# Patient Record
Sex: Female | Born: 1950 | Race: White | Hispanic: No | Smoking: Current every day smoker
Health system: Southern US, Community
[De-identification: ages and names within clinical notes are randomized; demographics above are authoritative.]

## PROBLEM LIST (undated history)

## (undated) DIAGNOSIS — M545 Low back pain, unspecified: Secondary | ICD-10-CM

## (undated) DIAGNOSIS — K219 Gastro-esophageal reflux disease without esophagitis: Secondary | ICD-10-CM

## (undated) DIAGNOSIS — E876 Hypokalemia: Secondary | ICD-10-CM

## (undated) DIAGNOSIS — N189 Chronic kidney disease, unspecified: Secondary | ICD-10-CM

## (undated) DIAGNOSIS — E785 Hyperlipidemia, unspecified: Secondary | ICD-10-CM

## (undated) DIAGNOSIS — M549 Dorsalgia, unspecified: Secondary | ICD-10-CM

## (undated) DIAGNOSIS — F319 Bipolar disorder, unspecified: Secondary | ICD-10-CM

## (undated) DIAGNOSIS — I10 Essential (primary) hypertension: Secondary | ICD-10-CM

## (undated) DIAGNOSIS — F32A Depression, unspecified: Secondary | ICD-10-CM

## (undated) DIAGNOSIS — R7303 Prediabetes: Secondary | ICD-10-CM

## (undated) DIAGNOSIS — J449 Chronic obstructive pulmonary disease, unspecified: Secondary | ICD-10-CM

## (undated) DIAGNOSIS — K439 Ventral hernia without obstruction or gangrene: Secondary | ICD-10-CM

## (undated) DIAGNOSIS — F17200 Nicotine dependence, unspecified, uncomplicated: Secondary | ICD-10-CM

## (undated) DIAGNOSIS — K5790 Diverticulosis of intestine, part unspecified, without perforation or abscess without bleeding: Secondary | ICD-10-CM

## (undated) HISTORY — PX: ABDOMINAL HYSTERECTOMY: SHX81

## (undated) HISTORY — DX: Essential (primary) hypertension: I10

## (undated) HISTORY — DX: Nicotine dependence, unspecified, uncomplicated: F17.200

## (undated) HISTORY — PX: SKIN BIOPSY: SHX1

## (undated) HISTORY — PX: CHOLECYSTECTOMY: SHX55

## (undated) HISTORY — DX: Low back pain: M54.5

## (undated) HISTORY — DX: Low back pain, unspecified: M54.50

## (undated) HISTORY — DX: Diverticulosis of intestine, part unspecified, without perforation or abscess without bleeding: K57.90

## (undated) HISTORY — DX: Prediabetes: R73.03

## (undated) HISTORY — PX: UPPER GI ENDOSCOPY: SHX6162

## (undated) HISTORY — PX: HERNIA REPAIR: SHX51

## (undated) HISTORY — DX: Chronic kidney disease, unspecified: N18.9

## (undated) HISTORY — DX: Ventral hernia without obstruction or gangrene: K43.9

## (undated) HISTORY — DX: Hyperlipidemia, unspecified: E78.5

## (undated) HISTORY — PX: BREAST LUMPECTOMY: SHX2

## (undated) HISTORY — PX: BREAST SURGERY: SHX581

## (undated) HISTORY — DX: Hypokalemia: E87.6

## (undated) HISTORY — PX: COLONOSCOPY: SHX174

## (undated) HISTORY — DX: Chronic obstructive pulmonary disease, unspecified: J44.9

---

## 1986-12-18 HISTORY — PX: ABDOMINAL HYSTERECTOMY: SHX81

## 1998-05-18 ENCOUNTER — Other Ambulatory Visit: Admission: RE | Admit: 1998-05-18 | Discharge: 1998-05-18 | Payer: Self-pay

## 1998-05-31 ENCOUNTER — Emergency Department (HOSPITAL_COMMUNITY): Admission: EM | Admit: 1998-05-31 | Discharge: 1998-05-31 | Payer: Self-pay | Admitting: Emergency Medicine

## 1999-06-09 ENCOUNTER — Encounter: Payer: Self-pay | Admitting: Gastroenterology

## 1999-06-09 ENCOUNTER — Ambulatory Visit (HOSPITAL_COMMUNITY): Admission: RE | Admit: 1999-06-09 | Discharge: 1999-06-09 | Payer: Self-pay | Admitting: Gastroenterology

## 1999-06-13 ENCOUNTER — Ambulatory Visit (HOSPITAL_COMMUNITY): Admission: RE | Admit: 1999-06-13 | Discharge: 1999-06-13 | Payer: Self-pay | Admitting: Gastroenterology

## 1999-09-29 ENCOUNTER — Other Ambulatory Visit: Admission: RE | Admit: 1999-09-29 | Discharge: 1999-09-29 | Payer: Self-pay | Admitting: Family Medicine

## 1999-11-18 ENCOUNTER — Encounter: Payer: Self-pay | Admitting: General Surgery

## 1999-11-21 ENCOUNTER — Encounter: Payer: Self-pay | Admitting: General Surgery

## 1999-11-21 ENCOUNTER — Ambulatory Visit (HOSPITAL_COMMUNITY): Admission: RE | Admit: 1999-11-21 | Discharge: 1999-11-21 | Payer: Self-pay | Admitting: General Surgery

## 2001-04-01 ENCOUNTER — Encounter: Admission: RE | Admit: 2001-04-01 | Discharge: 2001-04-01 | Payer: Self-pay | Admitting: General Surgery

## 2001-04-01 ENCOUNTER — Encounter: Payer: Self-pay | Admitting: General Surgery

## 2002-01-23 ENCOUNTER — Encounter: Payer: Self-pay | Admitting: General Surgery

## 2002-01-23 ENCOUNTER — Encounter: Admission: RE | Admit: 2002-01-23 | Discharge: 2002-01-23 | Payer: Self-pay | Admitting: General Surgery

## 2002-06-05 ENCOUNTER — Emergency Department (HOSPITAL_COMMUNITY): Admission: EM | Admit: 2002-06-05 | Discharge: 2002-06-05 | Payer: Self-pay | Admitting: *Deleted

## 2002-06-06 ENCOUNTER — Ambulatory Visit (HOSPITAL_COMMUNITY): Admission: RE | Admit: 2002-06-06 | Discharge: 2002-06-06 | Payer: Self-pay | Admitting: *Deleted

## 2002-12-10 ENCOUNTER — Ambulatory Visit (HOSPITAL_COMMUNITY): Admission: RE | Admit: 2002-12-10 | Discharge: 2002-12-10 | Payer: Self-pay | Admitting: Gastroenterology

## 2002-12-10 ENCOUNTER — Encounter: Payer: Self-pay | Admitting: Gastroenterology

## 2003-01-23 ENCOUNTER — Encounter: Payer: Self-pay | Admitting: General Surgery

## 2003-01-23 ENCOUNTER — Encounter: Admission: RE | Admit: 2003-01-23 | Discharge: 2003-01-23 | Payer: Self-pay | Admitting: General Surgery

## 2004-06-16 ENCOUNTER — Encounter: Admission: RE | Admit: 2004-06-16 | Discharge: 2004-06-16 | Payer: Self-pay | Admitting: General Surgery

## 2004-12-18 HISTORY — PX: CHOLECYSTECTOMY: SHX55

## 2005-06-22 ENCOUNTER — Encounter: Admission: RE | Admit: 2005-06-22 | Discharge: 2005-06-22 | Payer: Self-pay | Admitting: Internal Medicine

## 2005-12-22 ENCOUNTER — Encounter: Admission: RE | Admit: 2005-12-22 | Discharge: 2005-12-22 | Payer: Self-pay | Admitting: Internal Medicine

## 2006-07-24 ENCOUNTER — Encounter: Admission: RE | Admit: 2006-07-24 | Discharge: 2006-07-24 | Payer: Self-pay | Admitting: Internal Medicine

## 2007-08-01 ENCOUNTER — Encounter: Admission: RE | Admit: 2007-08-01 | Discharge: 2007-08-01 | Payer: Self-pay | Admitting: Family Medicine

## 2008-09-23 ENCOUNTER — Encounter: Admission: RE | Admit: 2008-09-23 | Discharge: 2008-09-23 | Payer: Self-pay | Admitting: Family Medicine

## 2010-10-05 ENCOUNTER — Encounter: Admission: RE | Admit: 2010-10-05 | Discharge: 2010-10-05 | Payer: Self-pay | Admitting: Family Medicine

## 2010-10-21 ENCOUNTER — Encounter: Admission: RE | Admit: 2010-10-21 | Discharge: 2010-10-21 | Payer: Self-pay | Admitting: Family Medicine

## 2011-01-08 ENCOUNTER — Encounter: Payer: Self-pay | Admitting: Family Medicine

## 2011-09-28 ENCOUNTER — Other Ambulatory Visit: Payer: Self-pay | Admitting: Family Medicine

## 2011-09-28 DIAGNOSIS — Z1231 Encounter for screening mammogram for malignant neoplasm of breast: Secondary | ICD-10-CM

## 2011-10-24 ENCOUNTER — Ambulatory Visit
Admission: RE | Admit: 2011-10-24 | Discharge: 2011-10-24 | Disposition: A | Discharge status: Home / Self Care | Payer: Medicare Other | Source: Ambulatory Visit | Attending: Family Medicine | Admitting: Family Medicine

## 2011-10-24 DIAGNOSIS — Z1231 Encounter for screening mammogram for malignant neoplasm of breast: Secondary | ICD-10-CM

## 2012-09-25 ENCOUNTER — Other Ambulatory Visit: Payer: Self-pay | Admitting: Family Medicine

## 2012-09-25 DIAGNOSIS — Z1231 Encounter for screening mammogram for malignant neoplasm of breast: Secondary | ICD-10-CM

## 2012-10-24 ENCOUNTER — Ambulatory Visit
Admission: RE | Admit: 2012-10-24 | Discharge: 2012-10-24 | Disposition: A | Discharge status: Home / Self Care | Payer: Medicare Other | Source: Ambulatory Visit | Attending: Family Medicine | Admitting: Family Medicine

## 2012-10-24 DIAGNOSIS — Z1231 Encounter for screening mammogram for malignant neoplasm of breast: Secondary | ICD-10-CM

## 2014-01-15 ENCOUNTER — Other Ambulatory Visit: Payer: Self-pay | Admitting: Physician Assistant

## 2014-01-15 ENCOUNTER — Other Ambulatory Visit: Payer: Self-pay

## 2014-01-15 DIAGNOSIS — N644 Mastodynia: Secondary | ICD-10-CM

## 2014-01-22 ENCOUNTER — Ambulatory Visit
Admission: RE | Admit: 2014-01-22 | Discharge: 2014-01-22 | Disposition: A | Discharge status: Home / Self Care | Payer: Medicare Other | Source: Ambulatory Visit | Attending: Physician Assistant | Admitting: Physician Assistant

## 2014-01-22 DIAGNOSIS — N644 Mastodynia: Secondary | ICD-10-CM

## 2014-08-25 ENCOUNTER — Emergency Department (HOSPITAL_COMMUNITY)
Admission: EM | Admit: 2014-08-25 | Discharge: 2014-08-25 | Disposition: A | Discharge status: Home / Self Care | Payer: Medicare Other | Attending: Dermatology | Admitting: Dermatology

## 2014-08-25 ENCOUNTER — Encounter (HOSPITAL_COMMUNITY): Payer: Self-pay | Admitting: Emergency Medicine

## 2014-08-25 ENCOUNTER — Emergency Department (HOSPITAL_COMMUNITY): Payer: Medicare Other

## 2014-08-25 DIAGNOSIS — IMO0002 Reserved for concepts with insufficient information to code with codable children: Secondary | ICD-10-CM | POA: Insufficient documentation

## 2014-08-25 DIAGNOSIS — Y9389 Activity, other specified: Secondary | ICD-10-CM | POA: Insufficient documentation

## 2014-08-25 DIAGNOSIS — S0993XA Unspecified injury of face, initial encounter: Secondary | ICD-10-CM | POA: Insufficient documentation

## 2014-08-25 DIAGNOSIS — Y92410 Unspecified street and highway as the place of occurrence of the external cause: Secondary | ICD-10-CM | POA: Diagnosis not present

## 2014-08-25 DIAGNOSIS — M542 Cervicalgia: Secondary | ICD-10-CM

## 2014-08-25 DIAGNOSIS — F411 Generalized anxiety disorder: Secondary | ICD-10-CM | POA: Insufficient documentation

## 2014-08-25 DIAGNOSIS — S199XXA Unspecified injury of neck, initial encounter: Principal | ICD-10-CM

## 2014-08-25 HISTORY — DX: Bipolar disorder, unspecified: F31.9

## 2014-08-25 HISTORY — DX: Dorsalgia, unspecified: M54.9

## 2014-08-25 HISTORY — DX: Gastro-esophageal reflux disease without esophagitis: K21.9
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
## 2014-08-25 MED ORDER — OXYCODONE-ACETAMINOPHEN 5-325 MG PO TABS
2.0000 | ORAL_TABLET | Freq: Once | ORAL | Status: AC
  Administered 2014-08-25: 2 via ORAL
  Filled 2014-08-25: qty 2

## 2014-08-25 MED ORDER — OXYCODONE-ACETAMINOPHEN 5-325 MG PO TABS: 1.0000 | ORAL_TABLET | Freq: Four times a day (QID) | ORAL | Status: DC | PRN

## 2014-08-25 MED ORDER — ONDANSETRON 8 MG PO TBDP
8.0000 mg | ORAL_TABLET | Freq: Three times a day (TID) | ORAL | Status: DC | PRN
Start: 2014-08-25 — End: 2018-07-01

## 2014-08-25 MED ORDER — ONDANSETRON 4 MG PO TBDP
8.0000 mg | ORAL_TABLET | Freq: Once | ORAL | Status: AC
  Administered 2014-08-25: 8 mg via ORAL
  Filled 2014-08-25: qty 2

## 2014-08-25 NOTE — Discharge Instructions (Signed)
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
## 2014-08-25 NOTE — ED Notes (Signed)
Patient transported to X-ray 
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
## 2015-01-21 DIAGNOSIS — Z79899 Other long term (current) drug therapy: Secondary | ICD-10-CM | POA: Diagnosis not present

## 2015-01-21 DIAGNOSIS — R7309 Other abnormal glucose: Secondary | ICD-10-CM | POA: Diagnosis not present

## 2015-01-21 DIAGNOSIS — E782 Mixed hyperlipidemia: Secondary | ICD-10-CM | POA: Diagnosis not present

## 2015-01-26 DIAGNOSIS — R7309 Other abnormal glucose: Secondary | ICD-10-CM | POA: Diagnosis not present

## 2015-01-26 DIAGNOSIS — Z79899 Other long term (current) drug therapy: Secondary | ICD-10-CM | POA: Diagnosis not present

## 2015-01-26 DIAGNOSIS — N183 Chronic kidney disease, stage 3 (moderate): Secondary | ICD-10-CM | POA: Diagnosis not present

## 2015-01-26 DIAGNOSIS — K219 Gastro-esophageal reflux disease without esophagitis: Secondary | ICD-10-CM | POA: Diagnosis not present

## 2015-01-26 DIAGNOSIS — I10 Essential (primary) hypertension: Secondary | ICD-10-CM | POA: Diagnosis not present

## 2015-02-05 DIAGNOSIS — M545 Low back pain: Secondary | ICD-10-CM | POA: Diagnosis not present

## 2015-09-29 DIAGNOSIS — Z23 Encounter for immunization: Secondary | ICD-10-CM | POA: Diagnosis not present

## 2015-09-29 DIAGNOSIS — N183 Chronic kidney disease, stage 3 (moderate): Secondary | ICD-10-CM | POA: Diagnosis not present

## 2015-09-29 DIAGNOSIS — E876 Hypokalemia: Secondary | ICD-10-CM | POA: Diagnosis not present

## 2015-09-29 DIAGNOSIS — Z79899 Other long term (current) drug therapy: Secondary | ICD-10-CM | POA: Diagnosis not present

## 2015-09-29 DIAGNOSIS — I10 Essential (primary) hypertension: Secondary | ICD-10-CM | POA: Diagnosis not present

## 2015-09-29 DIAGNOSIS — R7303 Prediabetes: Secondary | ICD-10-CM | POA: Diagnosis not present

## 2016-02-18 DIAGNOSIS — L570 Actinic keratosis: Secondary | ICD-10-CM | POA: Diagnosis not present

## 2016-02-18 DIAGNOSIS — D2272 Melanocytic nevi of left lower limb, including hip: Secondary | ICD-10-CM | POA: Diagnosis not present

## 2016-02-18 DIAGNOSIS — Z85828 Personal history of other malignant neoplasm of skin: Secondary | ICD-10-CM | POA: Diagnosis not present

## 2016-02-18 DIAGNOSIS — L821 Other seborrheic keratosis: Secondary | ICD-10-CM | POA: Diagnosis not present

## 2016-02-18 DIAGNOSIS — D692 Other nonthrombocytopenic purpura: Secondary | ICD-10-CM | POA: Diagnosis not present

## 2016-02-18 DIAGNOSIS — D485 Neoplasm of uncertain behavior of skin: Secondary | ICD-10-CM | POA: Diagnosis not present

## 2016-04-28 ENCOUNTER — Other Ambulatory Visit: Payer: Self-pay

## 2016-04-28 DIAGNOSIS — Z1231 Encounter for screening mammogram for malignant neoplasm of breast: Secondary | ICD-10-CM

## 2016-05-22 ENCOUNTER — Ambulatory Visit: Payer: Self-pay

## 2016-05-26 ENCOUNTER — Ambulatory Visit
Admission: RE | Admit: 2016-05-26 | Discharge: 2016-05-26 | Disposition: A | Discharge status: Home / Self Care | Payer: Medicare Other | Source: Ambulatory Visit

## 2016-05-26 ENCOUNTER — Other Ambulatory Visit: Payer: Self-pay | Admitting: Family Medicine

## 2016-05-26 DIAGNOSIS — N644 Mastodynia: Secondary | ICD-10-CM

## 2016-05-26 DIAGNOSIS — Z1231 Encounter for screening mammogram for malignant neoplasm of breast: Secondary | ICD-10-CM

## 2016-05-31 ENCOUNTER — Ambulatory Visit
Admission: RE | Admit: 2016-05-31 | Discharge: 2016-05-31 | Disposition: A | Discharge status: Home / Self Care | Payer: Medicare Other | Source: Ambulatory Visit | Attending: Family Medicine | Admitting: Family Medicine

## 2016-05-31 ENCOUNTER — Other Ambulatory Visit: Payer: Self-pay

## 2016-05-31 DIAGNOSIS — N644 Mastodynia: Secondary | ICD-10-CM | POA: Diagnosis not present

## 2016-06-05 DIAGNOSIS — E876 Hypokalemia: Secondary | ICD-10-CM | POA: Diagnosis not present

## 2016-06-05 DIAGNOSIS — E782 Mixed hyperlipidemia: Secondary | ICD-10-CM | POA: Diagnosis not present

## 2016-06-05 DIAGNOSIS — I10 Essential (primary) hypertension: Secondary | ICD-10-CM | POA: Diagnosis not present

## 2016-06-05 DIAGNOSIS — N183 Chronic kidney disease, stage 3 (moderate): Secondary | ICD-10-CM | POA: Diagnosis not present

## 2016-06-05 DIAGNOSIS — G8929 Other chronic pain: Secondary | ICD-10-CM | POA: Diagnosis not present

## 2016-09-05 DIAGNOSIS — Z79899 Other long term (current) drug therapy: Secondary | ICD-10-CM | POA: Diagnosis not present

## 2016-09-05 DIAGNOSIS — N644 Mastodynia: Secondary | ICD-10-CM | POA: Diagnosis not present

## 2016-09-05 DIAGNOSIS — R300 Dysuria: Secondary | ICD-10-CM | POA: Diagnosis not present

## 2016-09-05 DIAGNOSIS — M549 Dorsalgia, unspecified: Secondary | ICD-10-CM | POA: Diagnosis not present

## 2016-09-05 DIAGNOSIS — Z23 Encounter for immunization: Secondary | ICD-10-CM | POA: Diagnosis not present

## 2016-10-13 DIAGNOSIS — R300 Dysuria: Secondary | ICD-10-CM | POA: Diagnosis not present

## 2016-10-16 DIAGNOSIS — R300 Dysuria: Secondary | ICD-10-CM | POA: Diagnosis not present

## 2018-07-01 ENCOUNTER — Encounter: Payer: Self-pay | Admitting: Internal Medicine

## 2018-07-01 ENCOUNTER — Ambulatory Visit: Payer: Medicare Other | Admitting: Internal Medicine

## 2018-07-01 ENCOUNTER — Encounter (INDEPENDENT_AMBULATORY_CARE_PROVIDER_SITE_OTHER): Payer: Self-pay

## 2018-07-01 VITALS — BP 120/80 | HR 76 | Ht 63.5 in | Wt 176.4 lb

## 2018-07-01 DIAGNOSIS — R159 Full incontinence of feces: Secondary | ICD-10-CM | POA: Diagnosis not present

## 2018-07-01 DIAGNOSIS — Z1211 Encounter for screening for malignant neoplasm of colon: Secondary | ICD-10-CM

## 2018-07-01 DIAGNOSIS — K6289 Other specified diseases of anus and rectum: Secondary | ICD-10-CM | POA: Diagnosis not present

## 2018-07-01 DIAGNOSIS — R32 Unspecified urinary incontinence: Secondary | ICD-10-CM
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
## 2018-07-01 NOTE — Patient Instructions (Signed)
  You have been scheduled for a colonoscopy. Please follow written instructions given to you at your visit today.  Please pick up your prep supplies at the pharmacy. If you use inhalers (even only as needed), please bring them with you on the day of your procedure.  You have been scheduled to have an anorectal manometry at [facility] on 07/17/18 at 10:30AM. Please arrive 30 minutes prior to your appointment time for registration (1st floor of the hospital-admissions).  Please make certain to use 1 Fleets enema 2 hours prior to coming for your appointment. You can purchase Fleets enemas from the laxative section at your drug store. You should not eat anything during the two hours prior to the procedure. You may take regular medications with small sips of water at least 2 hours prior to the study.  Anorectal manometry is a test performed to evaluate patients with constipation or fecal incontinence. This test measures the pressures of the anal sphincter muscles, the sensation in the rectum, and the neural reflexes that are needed for normal bowel movements.  THE PROCEDURE The test takes approximately 30 minutes to 1 hour. You will be asked to change into a hospital gown. A technician or nurse will explain the procedure to you, take a brief health history, and answer any questions you may have. The patient then lies on his or her left side. A small, flexible tube, about the size of a thermometer, with a balloon at the end is inserted into the rectum. The catheter is connected to a machine that measures the pressure. During the test, the small balloon attached to the catheter may be inflated in the rectum to assess the normal reflex pathways. The nurse or technician may also ask the person to squeeze, relax, and push at various times. The anal sphincter muscle pressures are measured during each of these maneuvers. To squeeze, the patient tightens the sphincter muscles as if trying to prevent anything  from coming out. To push or bear down, the patient strains down as if trying to have a bowel movement.   I appreciate the opportunity to care for you. Silvano Rusk, MD, [facility]

## 2018-07-07 ENCOUNTER — Encounter: Payer: Self-pay | Admitting: Internal Medicine

## 2018-07-07 DIAGNOSIS — K6289 Other specified diseases of anus and rectum: Secondary | ICD-10-CM | POA: Insufficient documentation

## 2018-07-07 DIAGNOSIS — R159 Full incontinence of feces: Secondary | ICD-10-CM | POA: Insufficient documentation

## 2018-07-17 ENCOUNTER — Ambulatory Visit (HOSPITAL_COMMUNITY)
Admission: RE | Admit: 2018-07-17 | Discharge: 2018-07-17 | Disposition: A | Discharge status: Home / Self Care | Payer: Medicare Other | Source: Ambulatory Visit | Attending: Gastroenterology | Admitting: Gastroenterology

## 2018-07-17 ENCOUNTER — Encounter (HOSPITAL_COMMUNITY): Payer: Self-pay | Admitting: Gastroenterology

## 2018-07-17 ENCOUNTER — Encounter (HOSPITAL_COMMUNITY)
Admission: RE | Disposition: A | Discharge status: Home / Self Care | Payer: Self-pay | Source: Ambulatory Visit | Attending: Gastroenterology

## 2018-07-17 DIAGNOSIS — R159 Full incontinence of feces: Secondary | ICD-10-CM | POA: Diagnosis not present

## 2018-07-17 DIAGNOSIS — K6289 Other specified diseases of anus and rectum: Secondary | ICD-10-CM | POA: Insufficient documentation

## 2018-07-17 HISTORY — PX: ANAL RECTAL MANOMETRY: SHX6358

## 2018-07-17 SURGERY — MANOMETRY, ANORECTAL

## 2018-07-17 NOTE — Progress Notes (Signed)
Anal Manometry done per protocol. Pt tolerated well without complication or distress.

## 2018-07-19 NOTE — Progress Notes (Signed)
Her anorectal function is abnormal - weak anal sphincter and discoordination of defection function  Please refer to pelvic PT   She can see me back next available

## 2018-08-15 ENCOUNTER — Encounter: Payer: Self-pay | Admitting: Internal Medicine

## 2018-08-27 ENCOUNTER — Ambulatory Visit (AMBULATORY_SURGERY_CENTER): Payer: Medicare Other | Admitting: Internal Medicine

## 2018-08-27 ENCOUNTER — Encounter: Payer: Self-pay | Admitting: Internal Medicine

## 2018-08-27 VITALS — BP 93/52 | HR 52 | Temp 95.3°F | Resp 18 | Ht 63.0 in | Wt 176.0 lb

## 2018-08-27 DIAGNOSIS — Z1211 Encounter for screening for malignant neoplasm of colon: Secondary | ICD-10-CM | POA: Diagnosis present

## 2018-08-27 MED ORDER — SODIUM CHLORIDE 0.9 % IV SOLN: 500.0000 mL | Freq: Once | INTRAVENOUS | Status: DC

## 2018-08-27 NOTE — Patient Instructions (Addendum)
No polyps or cancer seen.  You do have diverticulosis - thickened muscle rings and pouches in the colon wall. Please read the handout about this condition.  Next routine colonoscopy or other screening test in 10 years - 2029 - you will be 43 which is the start of current age group where we stop so maybe will not need one then. We will see.  PT co pay is $40. I am going to refer you for at least 1 visit to see what you think.  I appreciate the opportunity to care for you. Gatha Mayer, MD, FACG   YOU HAD AN ENDOSCOPIC PROCEDURE TODAY AT [facility]:   Refer to the procedure report that was given to you for any specific questions about what was found during the examination.  If the procedure report does not answer your questions, please call your gastroenterologist to clarify.  If you requested that your care partner not be given the details of your procedure findings, then the procedure report has been included in a sealed envelope for you to review at your convenience later.  YOU SHOULD EXPECT: Some feelings of bloating in the abdomen. Passage of more gas than usual.  Walking can help get rid of the air that was put into your GI tract during the procedure and reduce the bloating. If you had a lower endoscopy (such as a colonoscopy or flexible sigmoidoscopy) you may notice spotting of blood in your stool or on the toilet paper. If you underwent a bowel prep for your procedure, you may not have a normal bowel movement for a few days.  Please Note:  You might notice some irritation and congestion in your nose or some drainage.  This is from the oxygen used during your procedure.  There is no need for concern and it should clear up in a day or so.  SYMPTOMS TO REPORT IMMEDIATELY:   Following lower endoscopy (colonoscopy or flexible sigmoidoscopy):  Excessive amounts of blood in the stool  Significant tenderness or worsening of abdominal pains  Swelling of the abdomen  that is new, acute  Fever of 100F or higher   For urgent or emergent issues, a gastroenterologist can be reached at any hour by calling [phone].   DIET:  We do recommend a small meal at first, but then you may proceed to your regular diet.  Drink plenty of fluids but you should avoid alcoholic beverages for 24 hours.  ACTIVITY:  You should plan to take it easy for the rest of today and you should NOT DRIVE or use heavy machinery until tomorrow (because of the sedation medicines used during the test).    FOLLOW UP: Our staff will call the number listed on your records the next business day following your procedure to check on you and address any questions or concerns that you may have regarding the information given to you following your procedure. If we do not reach you, we will leave a message.  However, if you are feeling well and you are not experiencing any problems, there is no need to return our call.  We will assume that you have returned to your regular daily activities without incident.  If any biopsies were taken you will be contacted by phone or by letter within the next 1-3 weeks.  Please call us at [phone] if you have not heard about the biopsies in 3 weeks.    SIGNATURES/CONFIDENTIALITY: You and/or your care partner have signed  paperwork which will be entered into your electronic medical record.  These signatures attest to the fact that that the information above on your After Visit Summary has been reviewed and is understood.  Full responsibility of the confidentiality of this discharge information lies with you and/or your care-partner.   Continue present medications. Resume previous diet. Please read handout on Diverticulosis.

## 2018-08-27 NOTE — Op Note (Signed)
[place] Patient Name: Sandra Robinson Procedure Date: 08/27/2018 10:10 AM MRN: [id number] Endoscopist: Gatha Mayer , MD Age: 67 Referring MD:  Date of Birth: [birth year] Gender: Female Account #: [id number] Procedure:                Colonoscopy Indications:              Screening for colorectal malignant neoplasm, Last                            colonoscopy: 2008 Medicines:                Propofol per Anesthesia, Monitored Anesthesia Care Procedure:                Pre-Anesthesia Assessment:                           - Prior to the procedure, a History and Physical                            was performed, and patient medications and                            allergies were reviewed. The patient's tolerance of                            previous anesthesia was also reviewed. The risks                            and benefits of the procedure and the sedation                            options and risks were discussed with the patient.                            All questions were answered, and informed consent                            was obtained. Prior Anticoagulants: The patient has                            taken no previous anticoagulant or antiplatelet                            agents. ASA Grade Assessment: III - A patient with                            severe systemic disease. After reviewing the risks                            and benefits, the patient was deemed in                            satisfactory condition to undergo the procedure.  After obtaining informed consent, the colonoscope                            was passed under direct vision. Throughout the                            procedure, the patient's blood pressure, pulse, and                            oxygen saturations were monitored continuously. The                            Colonoscope was introduced through the anus and                            advanced to the  the cecum, identified by                            appendiceal orifice and ileocecal valve. The                            colonoscopy was somewhat difficult due to                            restricted mobility of the colon. Successful                            completion of the procedure was aided by                            straightening and shortening the scope to obtain                            bowel loop reduction. The patient tolerated the                            procedure well. The quality of the bowel                            preparation was good. The ileocecal valve,                            appendiceal orifice, and rectum were photographed.                            The bowel preparation used was Miralax. Scope In: 10:21:10 AM Scope Out: 10:36:19 AM Scope Withdrawal Time: 0 hours 8 minutes 18 seconds  Total Procedure Duration: 0 hours 15 minutes 9 seconds  Findings:                 The perianal and digital rectal examinations were                            normal.  Multiple diverticula were found in the sigmoid                            colon. There was narrowing of the colon in                            association with the diverticular opening.                           The exam was otherwise without abnormality on                            direct and retroflexion views. Complications:            No immediate complications. Estimated Blood Loss:     Estimated blood loss: none. Impression:               - Severe diverticulosis in the sigmoid colon. There                            was narrowing of the colon in association with the                            diverticular opening.                           - The examination was otherwise normal on direct                            and retroflexion views.                           - No specimens collected. Recommendation:           - Patient has a contact number available for                             emergencies. The signs and symptoms of potential                            delayed complications were discussed with the                            patient. Return to normal activities tomorrow.                            Written discharge instructions were provided to the                            patient.                           - Resume previous diet.                           - Continue present medications.                           -  Repeat colonoscopy in 10 years for screening                            purposes.                           - Will refer to pelvic PT to help with weak anal                            sphincter and incontinence Gatha Mayer, MD 08/27/2018 10:48:11 AM This report has been signed electronically.

## 2018-08-27 NOTE — Progress Notes (Signed)
Report given to PACU, vss 

## 2018-08-27 NOTE — Progress Notes (Signed)
Pt's states no medical or surgical changes since previsit or office visit. 

## 2018-08-28 ENCOUNTER — Telehealth: Payer: Self-pay | Admitting: Internal Medicine

## 2018-08-28 ENCOUNTER — Telehealth: Payer: Self-pay | Admitting: *Deleted

## 2018-08-28 ENCOUNTER — Telehealth: Payer: Self-pay

## 2018-08-28 DIAGNOSIS — K6289 Other specified diseases of anus and rectum: Secondary | ICD-10-CM

## 2018-08-28 DIAGNOSIS — R32 Unspecified urinary incontinence: Principal | ICD-10-CM

## 2018-08-28 DIAGNOSIS — R159 Full incontinence of feces: Secondary | ICD-10-CM

## 2018-08-28 NOTE — Telephone Encounter (Signed)
Patient states years ago it was recommended she have a colon resection for recurrent diverticulitis.  She wanted to know why Dr. Carlean Purl is not recommending.  Patient is advised that the recommendations were for PT.  She agrees to proceed.  She is advised that if she has recurrent diverticulitis she may be sent back to discuss with a surgeon.  She agrees to proceed with referral and will expect a call later this week.

## 2018-08-28 NOTE — Telephone Encounter (Signed)
  Follow up Call-  Call back number 08/27/2018  Post procedure Call Back phone  # [phone]  Permission to leave phone message Yes  Some recent data might be hidden    [facility]

## 2018-08-28 NOTE — Telephone Encounter (Signed)
Pt is returning your call and said she is doing good °

## 2018-08-28 NOTE — Telephone Encounter (Signed)
No answer, left message to call back later today, B.Briannia Laba RN. 

## 2018-09-17 DIAGNOSIS — Z01818 Encounter for other preprocedural examination: Secondary | ICD-10-CM

## 2020-11-01 DIAGNOSIS — Z1331 Encounter for screening for depression: Secondary | ICD-10-CM | POA: Diagnosis not present

## 2020-11-01 DIAGNOSIS — E669 Obesity, unspecified: Secondary | ICD-10-CM | POA: Diagnosis not present

## 2020-11-01 DIAGNOSIS — Z0000 Encounter for general adult medical examination without abnormal findings: Secondary | ICD-10-CM | POA: Diagnosis not present

## 2020-11-01 DIAGNOSIS — Z139 Encounter for screening, unspecified: Secondary | ICD-10-CM | POA: Diagnosis not present

## 2020-11-01 DIAGNOSIS — E785 Hyperlipidemia, unspecified: Secondary | ICD-10-CM | POA: Diagnosis not present

## 2020-11-01 DIAGNOSIS — Z9181 History of falling: Secondary | ICD-10-CM | POA: Diagnosis not present

## 2020-11-19 DIAGNOSIS — F3181 Bipolar II disorder: Secondary | ICD-10-CM | POA: Diagnosis not present

## 2021-02-24 DIAGNOSIS — F3181 Bipolar II disorder: Secondary | ICD-10-CM | POA: Diagnosis not present

## 2021-05-26 DIAGNOSIS — F3181 Bipolar II disorder: Secondary | ICD-10-CM | POA: Diagnosis not present

## 2021-06-06 DIAGNOSIS — L821 Other seborrheic keratosis: Secondary | ICD-10-CM | POA: Diagnosis not present

## 2021-06-06 DIAGNOSIS — D2272 Melanocytic nevi of left lower limb, including hip: Secondary | ICD-10-CM | POA: Diagnosis not present

## 2021-06-06 DIAGNOSIS — L570 Actinic keratosis: Secondary | ICD-10-CM | POA: Diagnosis not present

## 2021-06-06 DIAGNOSIS — L814 Other melanin hyperpigmentation: Secondary | ICD-10-CM | POA: Diagnosis not present

## 2021-06-06 DIAGNOSIS — L853 Xerosis cutis: Secondary | ICD-10-CM | POA: Diagnosis not present

## 2021-06-06 DIAGNOSIS — D1801 Hemangioma of skin and subcutaneous tissue: Secondary | ICD-10-CM | POA: Diagnosis not present

## 2021-06-06 DIAGNOSIS — L80 Vitiligo: Secondary | ICD-10-CM | POA: Diagnosis not present

## 2021-08-11 ENCOUNTER — Emergency Department (HOSPITAL_COMMUNITY): Payer: Medicare HMO

## 2021-08-11 ENCOUNTER — Other Ambulatory Visit: Payer: Self-pay

## 2021-08-11 ENCOUNTER — Encounter (HOSPITAL_COMMUNITY): Payer: Self-pay | Admitting: Internal Medicine

## 2021-08-11 ENCOUNTER — Inpatient Hospital Stay (HOSPITAL_COMMUNITY)
Admission: EM | Admit: 2021-08-11 | Discharge: 2021-08-18 | DRG: 041 | Disposition: A | Discharge status: Skilled Nursing Facility | Payer: Medicare HMO | Attending: Internal Medicine | Admitting: Internal Medicine

## 2021-08-11 DIAGNOSIS — F319 Bipolar disorder, unspecified: Secondary | ICD-10-CM | POA: Diagnosis present

## 2021-08-11 DIAGNOSIS — Z79891 Long term (current) use of opiate analgesic: Secondary | ICD-10-CM

## 2021-08-11 DIAGNOSIS — R000 Tachycardia, unspecified: Secondary | ICD-10-CM | POA: Diagnosis not present

## 2021-08-11 DIAGNOSIS — Z20822 Contact with and (suspected) exposure to covid-19: Secondary | ICD-10-CM | POA: Diagnosis present

## 2021-08-11 DIAGNOSIS — F419 Anxiety disorder, unspecified: Secondary | ICD-10-CM | POA: Diagnosis present

## 2021-08-11 DIAGNOSIS — R531 Weakness: Secondary | ICD-10-CM | POA: Diagnosis not present

## 2021-08-11 DIAGNOSIS — I6330 Cerebral infarction due to thrombosis of unspecified cerebral artery: Secondary | ICD-10-CM | POA: Insufficient documentation

## 2021-08-11 DIAGNOSIS — Z9049 Acquired absence of other specified parts of digestive tract: Secondary | ICD-10-CM

## 2021-08-11 DIAGNOSIS — J449 Chronic obstructive pulmonary disease, unspecified: Secondary | ICD-10-CM | POA: Diagnosis present

## 2021-08-11 DIAGNOSIS — R5383 Other fatigue: Secondary | ICD-10-CM | POA: Diagnosis not present

## 2021-08-11 DIAGNOSIS — R471 Dysarthria and anarthria: Secondary | ICD-10-CM | POA: Diagnosis present

## 2021-08-11 DIAGNOSIS — I69398 Other sequelae of cerebral infarction: Secondary | ICD-10-CM | POA: Diagnosis not present

## 2021-08-11 DIAGNOSIS — G929 Unspecified toxic encephalopathy: Secondary | ICD-10-CM | POA: Diagnosis present

## 2021-08-11 DIAGNOSIS — I10 Essential (primary) hypertension: Secondary | ICD-10-CM | POA: Diagnosis present

## 2021-08-11 DIAGNOSIS — G936 Cerebral edema: Secondary | ICD-10-CM | POA: Diagnosis not present

## 2021-08-11 DIAGNOSIS — I771 Stricture of artery: Secondary | ICD-10-CM | POA: Diagnosis not present

## 2021-08-11 DIAGNOSIS — I69811 Memory deficit following other cerebrovascular disease: Secondary | ICD-10-CM | POA: Diagnosis not present

## 2021-08-11 DIAGNOSIS — Z8673 Personal history of transient ischemic attack (TIA), and cerebral infarction without residual deficits: Secondary | ICD-10-CM | POA: Diagnosis not present

## 2021-08-11 DIAGNOSIS — Z90710 Acquired absence of both cervix and uterus: Secondary | ICD-10-CM

## 2021-08-11 DIAGNOSIS — F17210 Nicotine dependence, cigarettes, uncomplicated: Secondary | ICD-10-CM | POA: Diagnosis present

## 2021-08-11 DIAGNOSIS — R29704 NIHSS score 4: Secondary | ICD-10-CM | POA: Diagnosis present

## 2021-08-11 DIAGNOSIS — M6281 Muscle weakness (generalized): Secondary | ICD-10-CM | POA: Diagnosis not present

## 2021-08-11 DIAGNOSIS — R404 Transient alteration of awareness: Secondary | ICD-10-CM | POA: Diagnosis not present

## 2021-08-11 DIAGNOSIS — H5461 Unqualified visual loss, right eye, normal vision left eye: Secondary | ICD-10-CM | POA: Diagnosis present

## 2021-08-11 DIAGNOSIS — I6381 Other cerebral infarction due to occlusion or stenosis of small artery: Secondary | ICD-10-CM | POA: Diagnosis not present

## 2021-08-11 DIAGNOSIS — S199XXA Unspecified injury of neck, initial encounter: Secondary | ICD-10-CM | POA: Diagnosis not present

## 2021-08-11 DIAGNOSIS — Z8249 Family history of ischemic heart disease and other diseases of the circulatory system: Secondary | ICD-10-CM | POA: Diagnosis not present

## 2021-08-11 DIAGNOSIS — I739 Peripheral vascular disease, unspecified: Secondary | ICD-10-CM | POA: Diagnosis not present

## 2021-08-11 DIAGNOSIS — D320 Benign neoplasm of cerebral meninges: Secondary | ICD-10-CM | POA: Diagnosis present

## 2021-08-11 DIAGNOSIS — K219 Gastro-esophageal reflux disease without esophagitis: Secondary | ICD-10-CM | POA: Diagnosis present

## 2021-08-11 DIAGNOSIS — Z885 Allergy status to narcotic agent status: Secondary | ICD-10-CM | POA: Diagnosis not present

## 2021-08-11 DIAGNOSIS — Z79899 Other long term (current) drug therapy: Secondary | ICD-10-CM

## 2021-08-11 DIAGNOSIS — G9340 Encephalopathy, unspecified: Secondary | ICD-10-CM | POA: Diagnosis present

## 2021-08-11 DIAGNOSIS — I63432 Cerebral infarction due to embolism of left posterior cerebral artery: Secondary | ICD-10-CM | POA: Diagnosis present

## 2021-08-11 DIAGNOSIS — I517 Cardiomegaly: Secondary | ICD-10-CM | POA: Diagnosis not present

## 2021-08-11 DIAGNOSIS — E785 Hyperlipidemia, unspecified: Secondary | ICD-10-CM | POA: Diagnosis present

## 2021-08-11 DIAGNOSIS — F339 Major depressive disorder, recurrent, unspecified: Secondary | ICD-10-CM | POA: Diagnosis not present

## 2021-08-11 DIAGNOSIS — R410 Disorientation, unspecified: Secondary | ICD-10-CM | POA: Diagnosis not present

## 2021-08-11 DIAGNOSIS — I6503 Occlusion and stenosis of bilateral vertebral arteries: Secondary | ICD-10-CM | POA: Diagnosis not present

## 2021-08-11 DIAGNOSIS — Z041 Encounter for examination and observation following transport accident: Secondary | ICD-10-CM | POA: Diagnosis not present

## 2021-08-11 DIAGNOSIS — G319 Degenerative disease of nervous system, unspecified: Secondary | ICD-10-CM | POA: Diagnosis not present

## 2021-08-11 DIAGNOSIS — R414 Neurologic neglect syndrome: Secondary | ICD-10-CM | POA: Diagnosis present

## 2021-08-11 DIAGNOSIS — R4182 Altered mental status, unspecified: Secondary | ICD-10-CM | POA: Diagnosis not present

## 2021-08-11 DIAGNOSIS — R2681 Unsteadiness on feet: Secondary | ICD-10-CM | POA: Diagnosis not present

## 2021-08-11 DIAGNOSIS — Z139 Encounter for screening, unspecified: Secondary | ICD-10-CM

## 2021-08-11 DIAGNOSIS — R5381 Other malaise: Secondary | ICD-10-CM | POA: Diagnosis not present

## 2021-08-11 DIAGNOSIS — Z888 Allergy status to other drugs, medicaments and biological substances status: Secondary | ICD-10-CM

## 2021-08-11 DIAGNOSIS — G9389 Other specified disorders of brain: Secondary | ICD-10-CM | POA: Diagnosis not present

## 2021-08-11 DIAGNOSIS — S0990XA Unspecified injury of head, initial encounter: Secondary | ICD-10-CM | POA: Diagnosis not present

## 2021-08-11 DIAGNOSIS — I672 Cerebral atherosclerosis: Secondary | ICD-10-CM | POA: Diagnosis not present

## 2021-08-11 DIAGNOSIS — I6389 Other cerebral infarction: Secondary | ICD-10-CM | POA: Diagnosis not present

## 2021-08-11 DIAGNOSIS — E876 Hypokalemia: Secondary | ICD-10-CM | POA: Diagnosis present

## 2021-08-11 DIAGNOSIS — R0902 Hypoxemia: Secondary | ICD-10-CM | POA: Diagnosis not present

## 2021-08-11 DIAGNOSIS — Z91040 Latex allergy status: Secondary | ICD-10-CM | POA: Diagnosis not present

## 2021-08-11 DIAGNOSIS — I4891 Unspecified atrial fibrillation: Secondary | ICD-10-CM | POA: Diagnosis not present

## 2021-08-11 DIAGNOSIS — Z743 Need for continuous supervision: Secondary | ICD-10-CM | POA: Diagnosis not present

## 2021-08-11 HISTORY — DX: Chronic obstructive pulmonary disease, unspecified: J44.9

## 2021-08-11 HISTORY — DX: Essential (primary) hypertension: I10

## 2021-08-11 HISTORY — DX: Depression, unspecified: F32.A

## 2021-08-11 LAB — AMMONIA: Ammonia: 19 umol/L (ref 9–35)

## 2021-08-11 LAB — I-STAT CHEM 8, ED
BUN: 9 mg/dL (ref 8–23)
Calcium, Ion: 1.17 mmol/L (ref 1.15–1.40)
Chloride: 103 mmol/L (ref 98–111)
Creatinine, Ser: 1.1 mg/dL — ABNORMAL HIGH (ref 0.44–1.00)
Glucose, Bld: 113 mg/dL — ABNORMAL HIGH (ref 70–99)
HCT: 42 % (ref 36.0–46.0)
Hemoglobin: 14.3 g/dL (ref 12.0–15.0)
Potassium: 2.1 mmol/L — CL (ref 3.5–5.1)
Sodium: 141 mmol/L (ref 135–145)
TCO2: 25 mmol/L (ref 22–32)

## 2021-08-11 LAB — COMPREHENSIVE METABOLIC PANEL
ALT: 23 U/L (ref 0–44)
AST: 28 U/L (ref 15–41)
Albumin: 3.6 g/dL (ref 3.5–5.0)
Alkaline Phosphatase: 55 U/L (ref 38–126)
Anion gap: 8 (ref 5–15)
BUN: 10 mg/dL (ref 8–23)
CO2: 25 mmol/L (ref 22–32)
Calcium: 9.1 mg/dL (ref 8.9–10.3)
Chloride: 105 mmol/L (ref 98–111)
Creatinine, Ser: 1.15 mg/dL — ABNORMAL HIGH (ref 0.44–1.00)
GFR, Estimated: 52 mL/min — ABNORMAL LOW (ref >60)
Glucose, Bld: 119 mg/dL — ABNORMAL HIGH (ref 70–99)
Potassium: 2.2 mmol/L — CL (ref 3.5–5.1)
Sodium: 138 mmol/L (ref 135–145)
Total Bilirubin: 0.7 mg/dL (ref 0.3–1.2)
Total Protein: 6.8 g/dL (ref 6.5–8.1)

## 2021-08-11 LAB — CBC WITH DIFFERENTIAL/PLATELET
Abs Immature Granulocytes: 0.06 10*3/uL (ref 0.00–0.07)
Basophils Absolute: 0.1 10*3/uL (ref 0.0–0.1)
Basophils Relative: 1 %
Eosinophils Absolute: 0.1 10*3/uL (ref 0.0–0.5)
Eosinophils Relative: 1 %
HCT: 43.1 % (ref 36.0–46.0)
Hemoglobin: 14.5 g/dL (ref 12.0–15.0)
Immature Granulocytes: 1 %
Lymphocytes Relative: 31 %
Lymphs Abs: 3 10*3/uL (ref 0.7–4.0)
MCH: 29.9 pg (ref 26.0–34.0)
MCHC: 33.6 g/dL (ref 30.0–36.0)
MCV: 88.9 fL (ref 80.0–100.0)
Monocytes Absolute: 0.7 10*3/uL (ref 0.1–1.0)
Monocytes Relative: 7 %
Neutro Abs: 5.7 10*3/uL (ref 1.7–7.7)
Neutrophils Relative %: 59 %
Platelets: 219 10*3/uL (ref 150–400)
RBC: 4.85 MIL/uL (ref 3.87–5.11)
RDW: 15.3 % (ref 11.5–15.5)
WBC: 9.7 10*3/uL (ref 4.0–10.5)
nRBC: 0 % (ref 0.0–0.2)

## 2021-08-11 LAB — RESP PANEL BY RT-PCR (FLU A&B, COVID) ARPGX2
Influenza A by PCR: NEGATIVE
Influenza B by PCR: NEGATIVE
SARS Coronavirus 2 by RT PCR: NEGATIVE

## 2021-08-11 LAB — PROTIME-INR
INR: 1 (ref 0.8–1.2)
Prothrombin Time: 13.4 seconds (ref 11.4–15.2)

## 2021-08-11 LAB — ETHANOL: Alcohol, Ethyl (B): <10 mg/dL (ref <10)
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
## 2021-08-11 MED ORDER — POTASSIUM CHLORIDE 10 MEQ/100ML IV SOLN
10.0000 meq | INTRAVENOUS | Status: AC
  Administered 2021-08-12 (×4): 10 meq via INTRAVENOUS
  Filled 2021-08-11 (×4): qty 100

## 2021-08-11 MED ORDER — POTASSIUM CHLORIDE 2 MEQ/ML IV SOLN
INTRAVENOUS | Status: AC
  Filled 2021-08-11 (×2): qty 1000

## 2021-08-11 MED ORDER — POTASSIUM CHLORIDE CRYS ER 20 MEQ PO TBCR
40.0000 meq | EXTENDED_RELEASE_TABLET | Freq: Once | ORAL | Status: AC
  Administered 2021-08-12: 40 meq via ORAL
  Filled 2021-08-11: qty 2

## 2021-08-11 MED ORDER — POTASSIUM CHLORIDE 10 MEQ/100ML IV SOLN
10.0000 meq | Freq: Once | INTRAVENOUS | Status: AC
  Administered 2021-08-11: 10 meq via INTRAVENOUS
  Filled 2021-08-11: qty 100

## 2021-08-11 MED ORDER — POTASSIUM CHLORIDE CRYS ER 20 MEQ PO TBCR
40.0000 meq | EXTENDED_RELEASE_TABLET | Freq: Once | ORAL | Status: AC
  Administered 2021-08-11: 40 meq via ORAL
  Filled 2021-08-11: qty 2

## 2021-08-11 MED ORDER — LABETALOL HCL 5 MG/ML IV SOLN
10.0000 mg | INTRAVENOUS | Status: DC | PRN
  Administered 2021-08-12 (×3): 10 mg via INTRAVENOUS
  Filled 2021-08-11 (×4): qty 4

## 2021-08-11 NOTE — ED Notes (Signed)
Patient returns from CT

## 2021-08-11 NOTE — ED Notes (Signed)
Patient transported to CT 
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
## 2021-08-11 NOTE — ED Notes (Signed)
Patient has a skin tear to the left elbow

## 2021-08-12 DIAGNOSIS — I10 Essential (primary) hypertension: Secondary | ICD-10-CM | POA: Diagnosis present

## 2021-08-12 DIAGNOSIS — Z20822 Contact with and (suspected) exposure to covid-19: Secondary | ICD-10-CM | POA: Diagnosis present

## 2021-08-12 DIAGNOSIS — F319 Bipolar disorder, unspecified: Secondary | ICD-10-CM | POA: Diagnosis present

## 2021-08-12 DIAGNOSIS — G9340 Encephalopathy, unspecified: Secondary | ICD-10-CM | POA: Diagnosis present

## 2021-08-12 DIAGNOSIS — I63432 Cerebral infarction due to embolism of left posterior cerebral artery: Secondary | ICD-10-CM | POA: Diagnosis present

## 2021-08-12 DIAGNOSIS — K219 Gastro-esophageal reflux disease without esophagitis: Secondary | ICD-10-CM | POA: Diagnosis present

## 2021-08-12 DIAGNOSIS — D320 Benign neoplasm of cerebral meninges: Secondary | ICD-10-CM | POA: Diagnosis present

## 2021-08-12 DIAGNOSIS — R29704 NIHSS score 4: Secondary | ICD-10-CM | POA: Diagnosis present

## 2021-08-12 DIAGNOSIS — Z8249 Family history of ischemic heart disease and other diseases of the circulatory system: Secondary | ICD-10-CM | POA: Diagnosis not present

## 2021-08-12 DIAGNOSIS — F419 Anxiety disorder, unspecified: Secondary | ICD-10-CM | POA: Diagnosis present

## 2021-08-12 DIAGNOSIS — R471 Dysarthria and anarthria: Secondary | ICD-10-CM | POA: Diagnosis present

## 2021-08-12 DIAGNOSIS — I6330 Cerebral infarction due to thrombosis of unspecified cerebral artery: Secondary | ICD-10-CM | POA: Diagnosis not present

## 2021-08-12 DIAGNOSIS — Z91040 Latex allergy status: Secondary | ICD-10-CM | POA: Diagnosis not present

## 2021-08-12 DIAGNOSIS — R414 Neurologic neglect syndrome: Secondary | ICD-10-CM | POA: Diagnosis present

## 2021-08-12 DIAGNOSIS — F17210 Nicotine dependence, cigarettes, uncomplicated: Secondary | ICD-10-CM | POA: Diagnosis present

## 2021-08-12 DIAGNOSIS — E785 Hyperlipidemia, unspecified: Secondary | ICD-10-CM | POA: Diagnosis present

## 2021-08-12 DIAGNOSIS — Z79891 Long term (current) use of opiate analgesic: Secondary | ICD-10-CM | POA: Diagnosis not present

## 2021-08-12 DIAGNOSIS — Z888 Allergy status to other drugs, medicaments and biological substances status: Secondary | ICD-10-CM | POA: Diagnosis not present

## 2021-08-12 DIAGNOSIS — Z9049 Acquired absence of other specified parts of digestive tract: Secondary | ICD-10-CM | POA: Diagnosis not present

## 2021-08-12 DIAGNOSIS — E876 Hypokalemia: Secondary | ICD-10-CM | POA: Diagnosis not present

## 2021-08-12 DIAGNOSIS — J449 Chronic obstructive pulmonary disease, unspecified: Secondary | ICD-10-CM | POA: Diagnosis present

## 2021-08-12 DIAGNOSIS — Z885 Allergy status to narcotic agent status: Secondary | ICD-10-CM | POA: Diagnosis not present

## 2021-08-12 DIAGNOSIS — H5461 Unqualified visual loss, right eye, normal vision left eye: Secondary | ICD-10-CM | POA: Diagnosis present

## 2021-08-12 DIAGNOSIS — Z8673 Personal history of transient ischemic attack (TIA), and cerebral infarction without residual deficits: Secondary | ICD-10-CM | POA: Diagnosis not present

## 2021-08-12 DIAGNOSIS — I6389 Other cerebral infarction: Secondary | ICD-10-CM | POA: Diagnosis not present

## 2021-08-12 DIAGNOSIS — Z90710 Acquired absence of both cervix and uterus: Secondary | ICD-10-CM | POA: Diagnosis not present

## 2021-08-12 LAB — BASIC METABOLIC PANEL
Anion gap: 5 (ref 5–15)
Anion gap: 9 (ref 5–15)
BUN: 7 mg/dL — ABNORMAL LOW (ref 8–23)
BUN: 8 mg/dL (ref 8–23)
CO2: 24 mmol/L (ref 22–32)
CO2: 21 mmol/L — ABNORMAL LOW (ref 22–32)
Calcium: 8.8 mg/dL — ABNORMAL LOW (ref 8.9–10.3)
Calcium: 9 mg/dL (ref 8.9–10.3)
Chloride: 110 mmol/L (ref 98–111)
Chloride: 109 mmol/L (ref 98–111)
Creatinine, Ser: 1.01 mg/dL — ABNORMAL HIGH (ref 0.44–1.00)
Creatinine, Ser: 1.02 mg/dL — ABNORMAL HIGH (ref 0.44–1.00)
GFR, Estimated: >60 mL/min (ref >60)
GFR, Estimated: 60 mL/min — ABNORMAL LOW (ref >60)
Glucose, Bld: 116 mg/dL — ABNORMAL HIGH (ref 70–99)
Glucose, Bld: 113 mg/dL — ABNORMAL HIGH (ref 70–99)
Potassium: 2.9 mmol/L — ABNORMAL LOW (ref 3.5–5.1)
Potassium: 3.2 mmol/L — ABNORMAL LOW (ref 3.5–5.1)
Sodium: 139 mmol/L (ref 135–145)
Sodium: 139 mmol/L (ref 135–145)

## 2021-08-12 LAB — TSH: TSH: 6.573 u[IU]/mL — ABNORMAL HIGH (ref 0.350–4.500)

## 2021-08-12 LAB — VITAMIN B12: Vitamin B-12: 167 pg/mL — ABNORMAL LOW (ref 180–914)

## 2021-08-12 LAB — RPR: RPR Ser Ql: NONREACTIVE

## 2021-08-12 LAB — T4, FREE: Free T4: 0.89 ng/dL (ref 0.61–1.12)

## 2021-08-12 LAB — MAGNESIUM: Magnesium: 1.8 mg/dL (ref 1.7–2.4)

## 2021-08-12 MED ORDER — MAGNESIUM SULFATE 2 GM/50ML IV SOLN
2.0000 g | Freq: Once | INTRAVENOUS | Status: AC
  Administered 2021-08-12: 2 g via INTRAVENOUS
  Filled 2021-08-12: qty 50

## 2021-08-12 MED ORDER — VITAMIN B-12 1000 MCG PO TABS
1000.0000 ug | ORAL_TABLET | Freq: Every day | ORAL | Status: DC
  Administered 2021-08-12 – 2021-08-17 (×6): 1000 ug via ORAL
  Filled 2021-08-12 (×6): qty 1

## 2021-08-12 MED ORDER — POTASSIUM CHLORIDE CRYS ER 20 MEQ PO TBCR
40.0000 meq | EXTENDED_RELEASE_TABLET | Freq: Two times a day (BID) | ORAL | Status: AC
  Administered 2021-08-12 – 2021-08-13 (×2): 40 meq via ORAL
  Filled 2021-08-12 (×2): qty 2

## 2021-08-12 NOTE — ED Notes (Signed)
Patient ambulated slowly with 2 assist to BR and back to bed. She stated she had urinated but no urine was noted. No stool noted. She remains oriented to self and stated she was at [facility]. Still disoriented to time and situation.

## 2021-08-12 NOTE — ED Notes (Signed)
Patient removed IV again. New IV inserted

## 2021-08-12 NOTE — Progress Notes (Signed)
Patient SBP was high with SBP of 192, gave PRN labetalol, recheck at 1842, SBP was 200, MD notified. Patient was restless, and when asked if they were in pain responded "no" when asked if they felt anxious at all, they responded "yes" when asked about why they were anxious and if this was something new for them dt hospitalization, they responded that they "had been having anxiety for a few weeks now" when asked if anything had triggered this anxiety patient stated they " could not pinpoint it" previous progress notes state patient had removed several IV's and at bedside this RN witnessed patient pulling off telemetry leads several times and fidgeting in bed. MD aware, no new orders at this time.

## 2021-08-12 NOTE — ED Notes (Signed)
Patient is again attempting to remove IV, patient is very redirectable. Patient remains in the RN's line of site. Patient given hot tea per request. Patient remains confused.

## 2021-08-12 NOTE — Plan of Care (Signed)

## 2021-08-12 NOTE — ED Notes (Signed)
This RN found patient on bed holding IV. Patient had removed own IV. Patient states "No it had to come out" This RN attempted to explain to patient why IV was important. Patient allowed this RN to reinsert new IV.

## 2021-08-12 NOTE — Plan of Care (Signed)
  Problem: Clinical Measurements: Goal: Will remain free from infection Outcome: Progressing Goal: Respiratory complications will improve Outcome: Progressing Goal: Cardiovascular complication will be avoided Outcome: Progressing   Problem: Activity: Goal: Risk for activity intolerance will decrease Outcome: Progressing   Problem: Nutrition: Goal: Adequate nutrition will be maintained Outcome: Progressing   Problem: Elimination: Goal: Will not experience complications related to bowel motility Outcome: Progressing Goal: Will not experience complications related to urinary retention Outcome: Progressing   Problem: Safety: Goal: Ability to remain free from injury will improve Outcome: Progressing   Problem: Skin Integrity: Goal: Risk for impaired skin integrity will decrease Outcome: Progressing

## 2021-08-12 NOTE — ED Notes (Signed)
Provider at bedside

## 2021-08-12 NOTE — ED Notes (Signed)
A/A, oriented to self. Eating breakfast.
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
## 2021-08-12 NOTE — ED Notes (Signed)
Tele  Breakfast Ordered 

## 2021-08-12 NOTE — ED Notes (Addendum)
Resting, eyes closed, respirations even and unlabored. No distress. IV fluid infusing without difficulty.

## 2021-08-13 ENCOUNTER — Inpatient Hospital Stay (HOSPITAL_COMMUNITY): Payer: Medicare HMO

## 2021-08-13 LAB — BASIC METABOLIC PANEL
Anion gap: 9 (ref 5–15)
BUN: <5 mg/dL — ABNORMAL LOW (ref 8–23)
CO2: 22 mmol/L (ref 22–32)
Calcium: 9.2 mg/dL (ref 8.9–10.3)
Chloride: 107 mmol/L (ref 98–111)
Creatinine, Ser: 0.85 mg/dL (ref 0.44–1.00)
GFR, Estimated: >60 mL/min (ref >60)
Glucose, Bld: 121 mg/dL — ABNORMAL HIGH (ref 70–99)
Potassium: 3 mmol/L — ABNORMAL LOW (ref 3.5–5.1)
Sodium: 138 mmol/L (ref 135–145)

## 2021-08-13 LAB — CBC
HCT: 41.2 % (ref 36.0–46.0)
Hemoglobin: 14.3 g/dL (ref 12.0–15.0)
MCH: 30 pg (ref 26.0–34.0)
MCHC: 34.7 g/dL (ref 30.0–36.0)
MCV: 86.4 fL (ref 80.0–100.0)
Platelets: 238 10*3/uL (ref 150–400)
RBC: 4.77 MIL/uL (ref 3.87–5.11)
RDW: 15 % (ref 11.5–15.5)
WBC: 11 10*3/uL — ABNORMAL HIGH (ref 4.0–10.5)
nRBC: 0 % (ref 0.0–0.2)

## 2021-08-13 LAB — MAGNESIUM: Magnesium: 1.7 mg/dL (ref 1.7–2.4)

## 2021-08-13 IMAGING — MR MR HEAD W/O CM
6 of 10 series · 26 of 48 positions shown · non-contrast
Comparison: Head CT [DATE]

CLINICAL DATA: Mental status change, unknown cause.

EXAM:
MRI HEAD WITHOUT CONTRAST
TECHNIQUE: Multiplanar, multiecho pulse sequences of the brain and surrounding
structures were obtained without intravenous contrast.

[Series 2: DWI · axial · 3.0mm · 0.94mm/px · z∈[-52,+94]mm · 8 of 100 slices shown (1 of 2)]
[im 1/100]
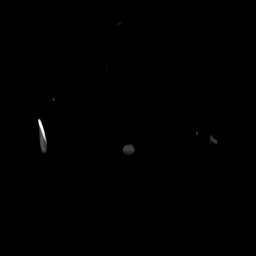
[im 15/100]
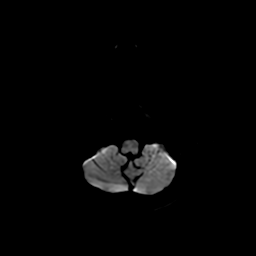
[im 29/100]
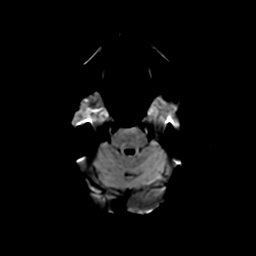
[im 43/100]
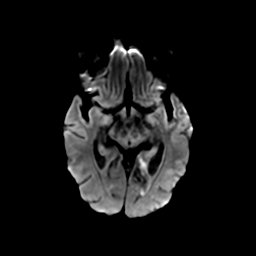
[im 57/100]
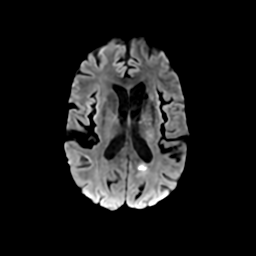
[im 71/100]
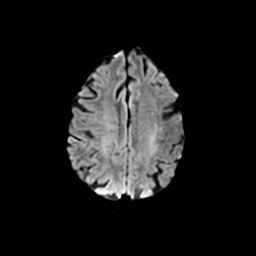
[im 85/100]
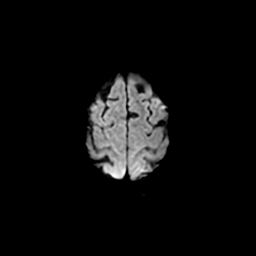
[im 100/100]
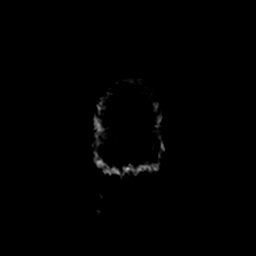

[Series 3: DWI · coronal · 4.0mm · 0.94mm/px · 6 of 73 slices shown (2 of 2)]
[im 1/73]
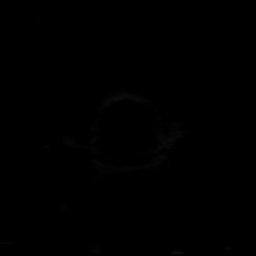
[im 15/73]
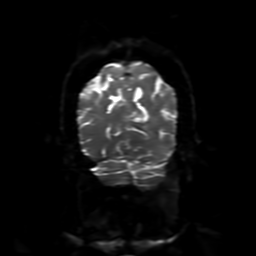
[im 29/73]
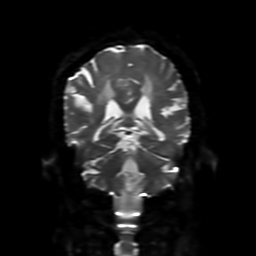
[im 44/73]
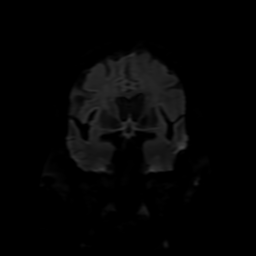
[im 58/73]
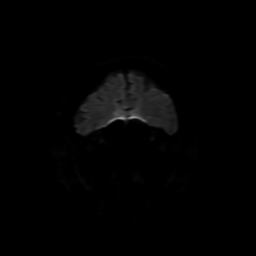
[im 73/73]
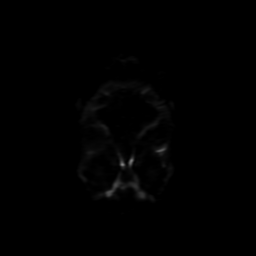

[Series 4: FLAIR · sagittal · 5.0mm · 0.23mm/px · 2 of 23 slices shown (1 of 2)]
[im 1/23]
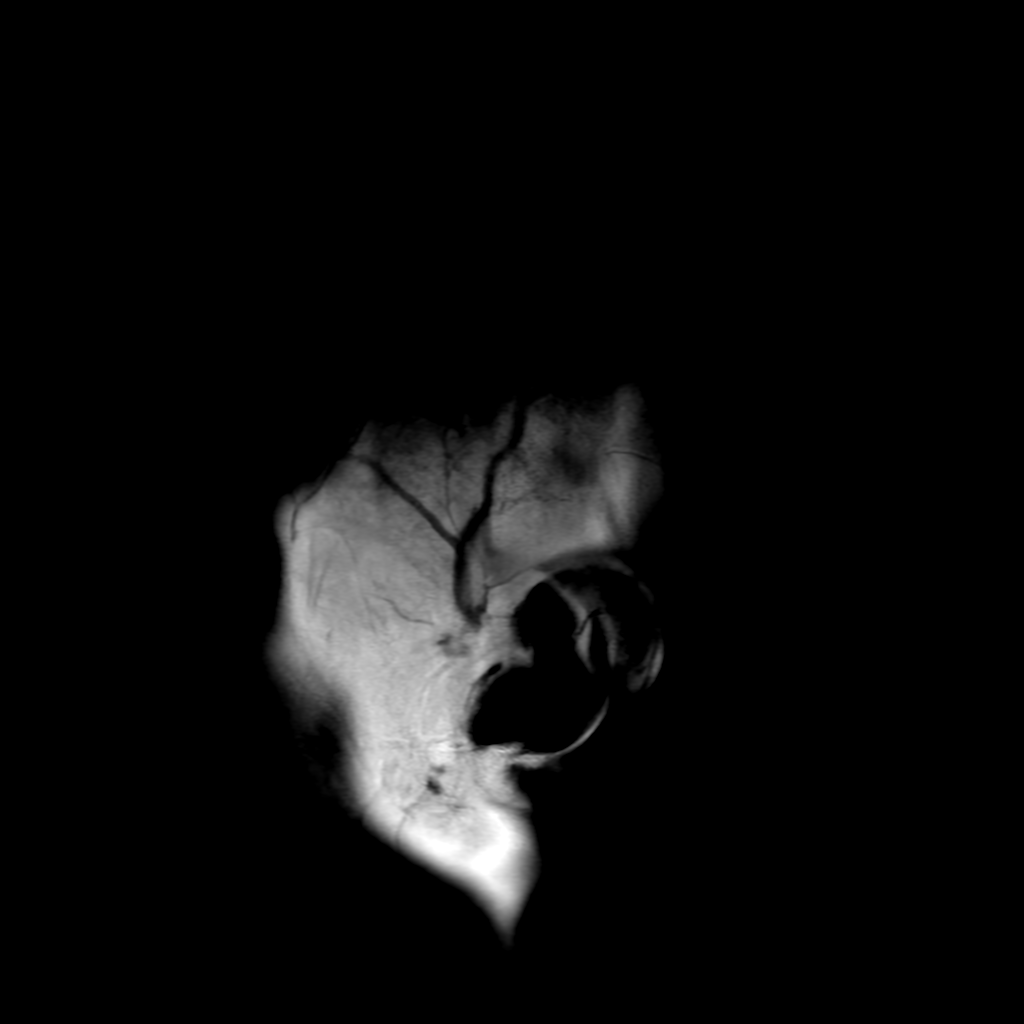
[im 23/23]
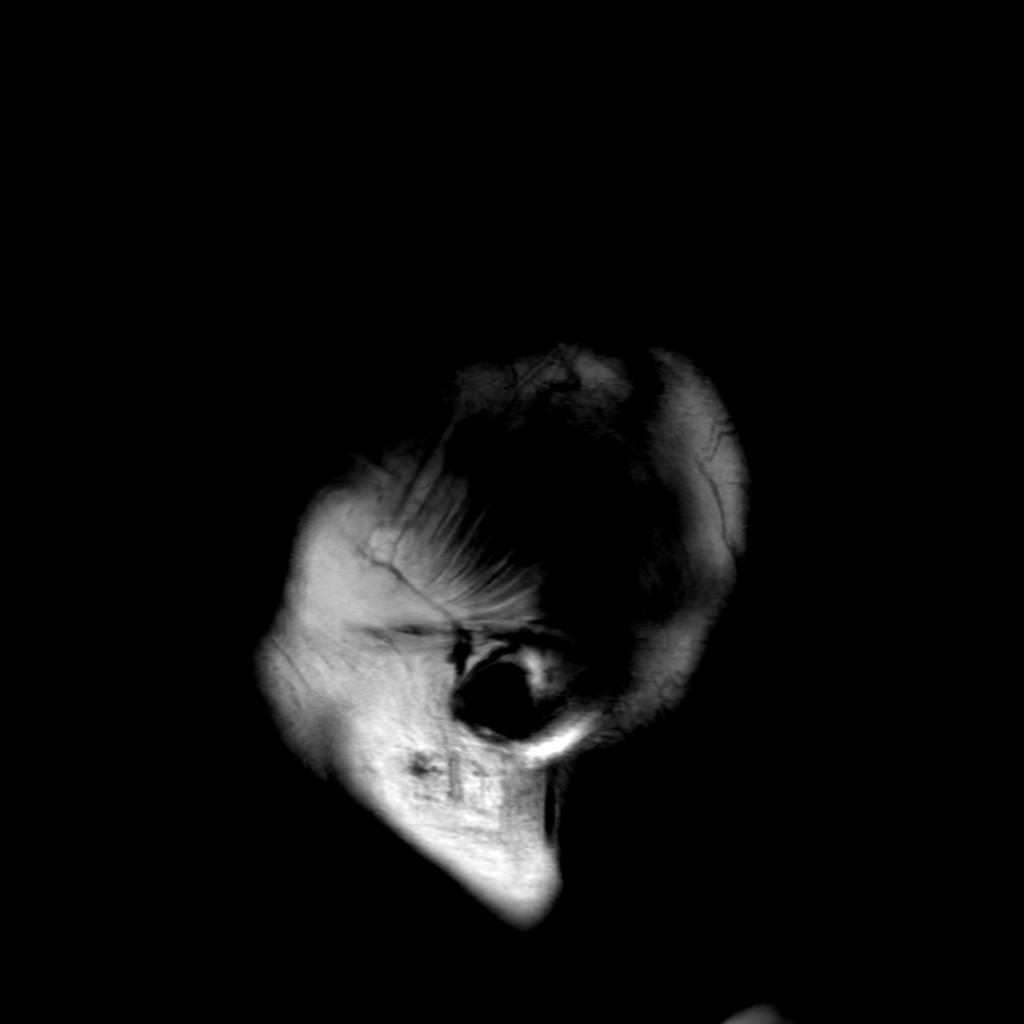

[Series 6: FLAIR · axial · 4.0mm · 0.45mm/px · z∈[-41,+104]mm · 3 of 35 slices shown (2 of 2)]
[im 1/35]
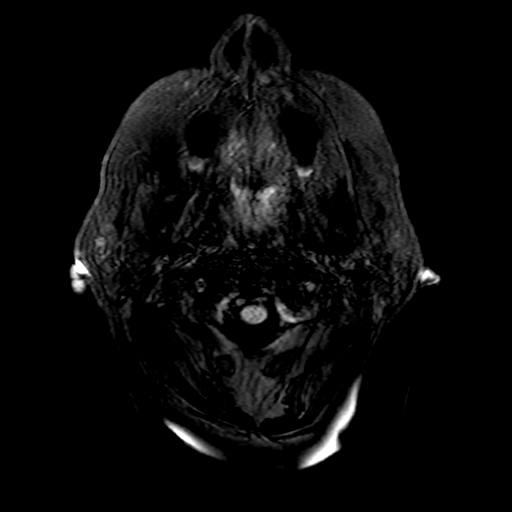
[im 18/35]
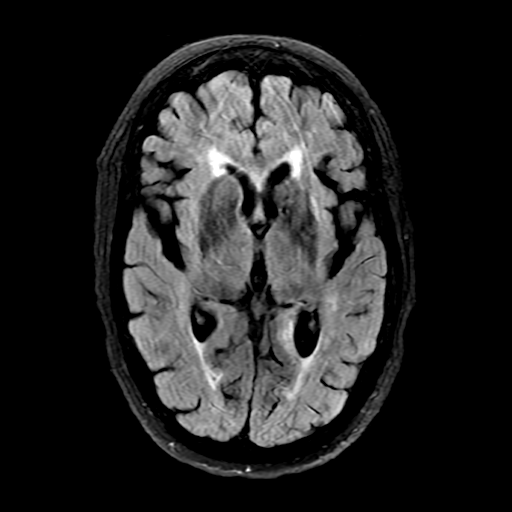
[im 35/35]
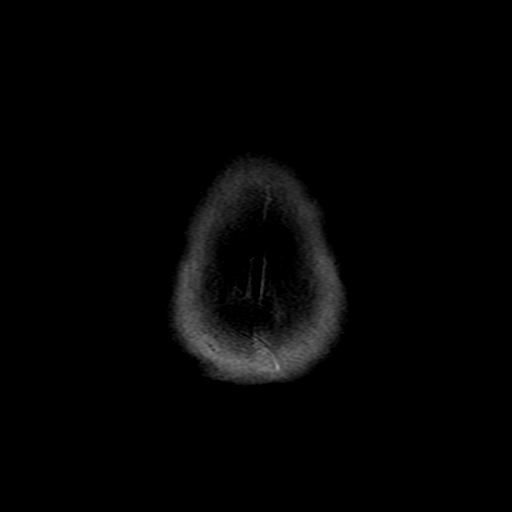

[Series 250: ADC · axial · 3.0mm · 0.94mm/px · z∈[-52,+94]mm · 4 of 50 slices shown (1 of 2)]
[im 1/50]
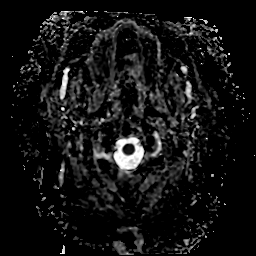
[im 17/50]
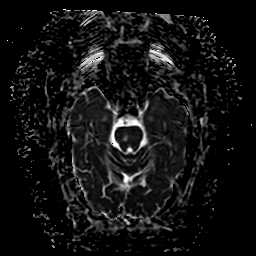
[im 33/50]
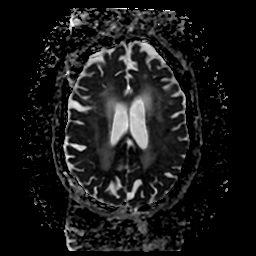
[im 50/50]
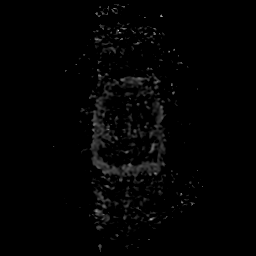

[Series 350: ADC · coronal · 4.0mm · 0.94mm/px · 3 of 37 slices shown (2 of 2)]
[im 1/37]
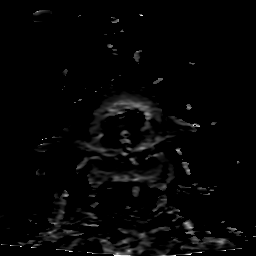
[im 19/37]
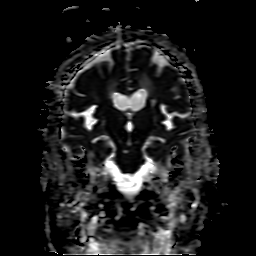
[im 37/37]
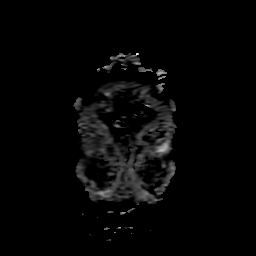

[26 of 48 positions shown; findings below may reference images not displayed]

FINDINGS: The study is mildly to moderately motion degraded as the patient was
unable to follow instructions. A coronal T2 sequence was not
obtained.

Brain: There is a small region of patchy restricted diffusion
involving the posteromedial left temporal lobe and medial left
occipital lobe consistent with an acute PCA territory infarct. This
also involves a small amount of the choroid plexus in the atrium of
the left lateral ventricle. There is also a punctate acute infarct
involving right temporoparietal white matter immediately posterior
to the insula. There is a chronic infarct anteriorly in the left
basal ganglia with associated chronic blood products.

Confluent T2 hyperintensities in the cerebral white matter
bilaterally are nonspecific but compatible with severe chronic small
vessel ischemic disease. Small chronic infarcts are noted in the
left corona radiata, right pons, and both cerebellar hemispheres. No
mass, midline shift, or extra-axial fluid collection is identified.
There is mild-to-moderate cerebral atrophy.

Vascular: Major intracranial vascular flow voids are preserved.

Skull and upper cervical spine: Unremarkable bone marrow signal.

Sinuses/Orbits: Unremarkable orbits. Clear paranasal sinuses. Small
bilateral mastoid effusions.

Other: None.
IMPRESSION: 1. Acute left PCA infarct.
2. Punctate acute right temporoparietal white matter infarct.
3. Severe chronic small vessel ischemic disease with multiple
chronic infarcts as above.
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
## 2021-08-13 MED ORDER — POTASSIUM CHLORIDE 10 MEQ/100ML IV SOLN
10.0000 meq | INTRAVENOUS | Status: DC
Start: 2021-08-13 — End: 2021-08-13
  Filled 2021-08-13: qty 100

## 2021-08-13 MED ORDER — MAGNESIUM SULFATE 2 GM/50ML IV SOLN
2.0000 g | Freq: Once | INTRAVENOUS | Status: AC
  Administered 2021-08-13: 2 g via INTRAVENOUS
  Filled 2021-08-13: qty 50

## 2021-08-13 MED ORDER — POTASSIUM CHLORIDE 10 MEQ/100ML IV SOLN
10.0000 meq | Freq: Once | INTRAVENOUS | Status: AC
  Administered 2021-08-13: 10 meq via INTRAVENOUS

## 2021-08-13 MED ORDER — BUSPIRONE HCL 10 MG PO TABS
30.0000 mg | ORAL_TABLET | Freq: Two times a day (BID) | ORAL | Status: DC
  Administered 2021-08-13 – 2021-08-17 (×10): 30 mg via ORAL
  Filled 2021-08-13 (×10): qty 3

## 2021-08-13 MED ORDER — TRAZODONE HCL 50 MG PO TABS
350.0000 mg | ORAL_TABLET | Freq: Every day | ORAL | Status: DC
  Administered 2021-08-13 – 2021-08-17 (×5): 350 mg via ORAL
  Filled 2021-08-13 (×5): qty 1

## 2021-08-13 MED ORDER — GABAPENTIN 400 MG PO CAPS
800.0000 mg | ORAL_CAPSULE | Freq: Four times a day (QID) | ORAL | Status: DC
  Administered 2021-08-13 – 2021-08-17 (×19): 800 mg via ORAL
  Filled 2021-08-13 (×19): qty 2

## 2021-08-13 MED ORDER — POTASSIUM CHLORIDE 10 MEQ/100ML IV SOLN
10.0000 meq | INTRAVENOUS | Status: AC
Start: 2021-08-13 — End: 2021-08-13
  Administered 2021-08-13 (×3): 10 meq via INTRAVENOUS
  Filled 2021-08-13 (×3): qty 100

## 2021-08-13 MED ORDER — SERTRALINE HCL 100 MG PO TABS
100.0000 mg | ORAL_TABLET | Freq: Two times a day (BID) | ORAL | Status: DC
  Administered 2021-08-13 – 2021-08-17 (×10): 100 mg via ORAL
  Filled 2021-08-13 (×10): qty 1

## 2021-08-13 MED ORDER — TOPIRAMATE 100 MG PO TABS
200.0000 mg | ORAL_TABLET | Freq: Two times a day (BID) | ORAL | Status: DC
  Administered 2021-08-13 – 2021-08-17 (×10): 200 mg via ORAL
  Filled 2021-08-13 (×10): qty 2
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
## 2021-08-13 NOTE — Plan of Care (Signed)
  Problem: Education: Goal: Knowledge of General Education information will improve Description: Including pain rating scale, medication(s)/side effects and non-pharmacologic comfort measures Outcome: Progressing   Problem: Health Behavior/Discharge Planning: Goal: Ability to manage health-related needs will improve Outcome: Progressing   Problem: Clinical Measurements: Goal: Ability to maintain clinical measurements within normal limits will improve Outcome: Progressing Goal: Will remain free from infection Outcome: Progressing Goal: Respiratory complications will improve Outcome: Progressing Goal: Cardiovascular complication will be avoided Outcome: Progressing   Problem: Activity: Goal: Risk for activity intolerance will decrease Outcome: Progressing   Problem: Nutrition: Goal: Adequate nutrition will be maintained Outcome: Progressing   Problem: Elimination: Goal: Will not experience complications related to bowel motility Outcome: Progressing Goal: Will not experience complications related to urinary retention Outcome: Progressing   Problem: Pain Managment: Goal: General experience of comfort will improve Outcome: Progressing   Problem: Safety: Goal: Ability to remain free from injury will improve Outcome: Progressing   Problem: Skin Integrity: Goal: Risk for impaired skin integrity will decrease Outcome: Progressing   

## 2021-08-13 NOTE — Progress Notes (Signed)
Each time BP taken automatically with the monitor, she tenses up and moves her arm and pulls at the BP cuff and BP is elevated.  On Machine, BP was 193/104 because she kept moving her arm and pulling at cuff.  This nurse did a manual BP and it was 158/98 and she thanked me because the cuff did not keep getting tighter and tighter on her arm.

## 2021-08-14 ENCOUNTER — Inpatient Hospital Stay (HOSPITAL_COMMUNITY): Payer: Medicare HMO

## 2021-08-14 DIAGNOSIS — I6389 Other cerebral infarction: Secondary | ICD-10-CM | POA: Diagnosis not present

## 2021-08-14 LAB — HEMOGLOBIN A1C
Hgb A1c MFr Bld: 5.3 % (ref 4.8–5.6)
Mean Plasma Glucose: 105.41 mg/dL

## 2021-08-14 LAB — LIPID PANEL
Cholesterol: 182 mg/dL (ref 0–200)
HDL: 50 mg/dL (ref >40)
LDL Cholesterol: 106 mg/dL — ABNORMAL HIGH (ref 0–99)
Total CHOL/HDL Ratio: 3.6 RATIO
Triglycerides: 132 mg/dL (ref <150)
VLDL: 26 mg/dL (ref 0–40)

## 2021-08-14 LAB — ECHOCARDIOGRAM COMPLETE
AR max vel: 3.06 cm2
AV Area VTI: 2.68 cm2
AV Area mean vel: 2.73 cm2
AV Mean grad: 4 mmHg
AV Peak grad: 7.4 mmHg
Ao pk vel: 1.36 m/s
Area-P 1/2: 1.96 cm2
Height: 63 in
MV VTI: 1.93 cm2
S' Lateral: 3.6 cm

## 2021-08-14 IMAGING — CT CT ANGIO HEAD-NECK (W OR W/O PERF)
1 of 11 series · 5 of 35 positions shown · IV contrast (omnipaque)
Comparison: Brain MRI yesterday.  Head CT [DATE].

MRI cervical spine [DATE].

CLINICAL DATA: 70-year-old female with left PCA territory infarct,
punctate right hemisphere white matter infarct on MRI yesterday.

EXAM:
CT ANGIOGRAPHY HEAD AND NECK
TECHNIQUE: Multidetector CT imaging of the head and neck was performed using
the standard protocol during bolus administration of intravenous
contrast. Multiplanar CT image reconstructions and MIPs were
obtained to evaluate the vascular anatomy. Carotid stenosis
measurements (when applicable) are obtained utilizing NASCET
criteria, using the distal internal carotid diameter as the
denominator.
CONTRAST:  75mL OMNIPAQUE IOHEXOL 350 MG/ML SOLN

[Series 12: cta neck axial · axial · 0.39mm/px · z∈[+888,+1103]mm · 5 of 328 slices shown]
[im 55/328  soft-tissue]
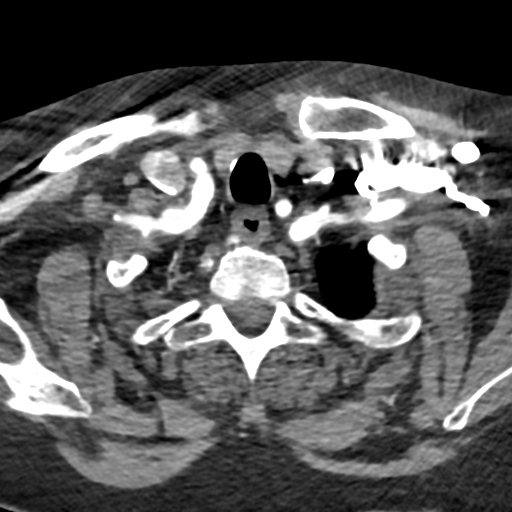
[im 110/328  bone]
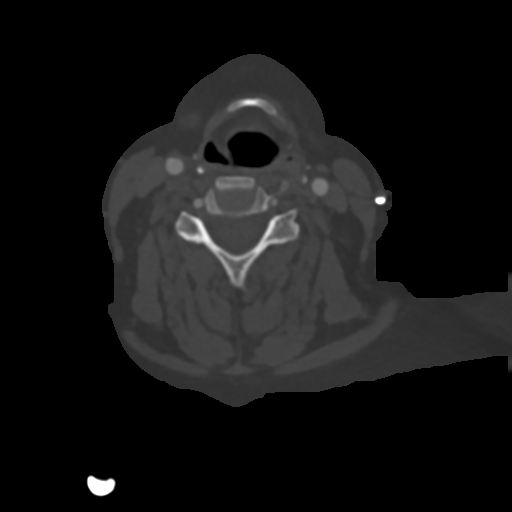
[im 164/328  soft-tissue]
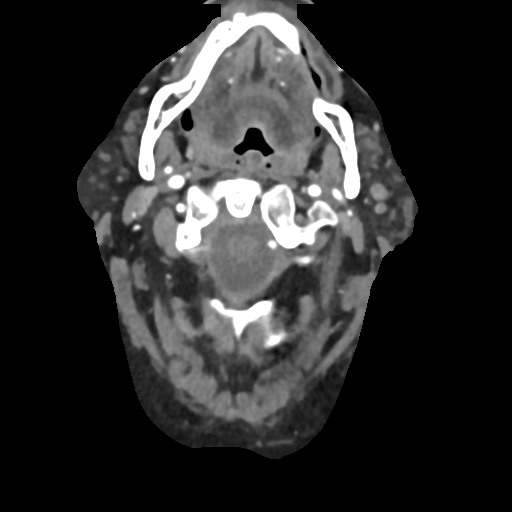
[im 219/328  bone]
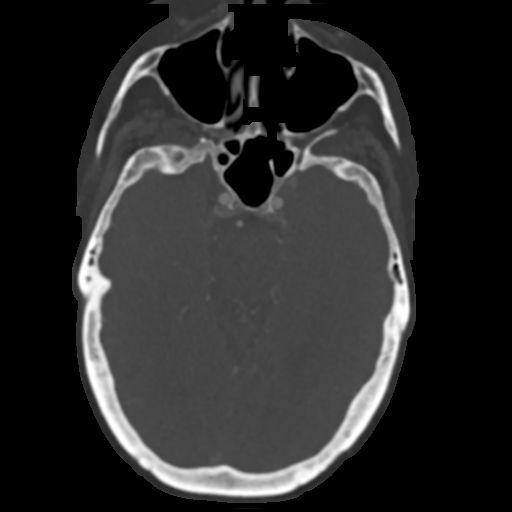
[im 273/328  soft-tissue]
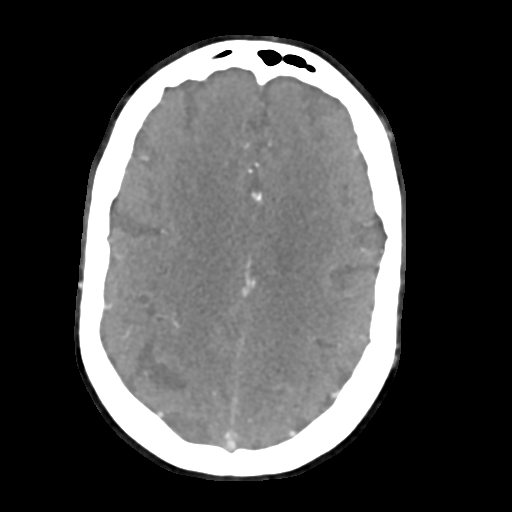

[5 of 35 positions shown; findings below may reference images not displayed]

FINDINGS: CT HEAD

Brain: Mild cytotoxic edema in the medial left occipital lobe
corresponding to left PCA territory restricted diffusion seen
yesterday (series 4, image 15). Superimposed advanced bilateral
cerebral white matter hypodensity. Chronic lacunar infarcts in the
left basal ganglia and deep white matter capsule. Multiple chronic
cerebellar infarcts. Right brainstem lacune better demonstrated by
MRI.

No midline shift, ventriculomegaly, mass effect, evidence of mass
lesion, intracranial hemorrhage or new cortically based acute
infarction.

Calvarium and skull base: No acute osseous abnormality identified.

Paranasal sinuses: Visualized paranasal sinuses and mastoids are
stable and well aerated.

Orbits: Visualized orbits and scalp soft tissues are within normal
limits.

CTA NECK

Skeleton: Absent dentition. No acute osseous abnormality identified.
Cervical spine degeneration remains mild for age.

Upper chest: Negative.

Other neck: Negative.

Aortic arch: Calcified aortic atherosclerosis. 3 vessel arch
configuration.

Right carotid system: Brachiocephalic plaque without stenosis.
Normal right CCA origin. Mildly tortuous right CCA. Calcified plaque
at the posterior right ICA origin and bulb with less than 50 %
stenosis with respect to the distal vessel.

Left carotid system: Mild pulsation artifact in addition to
atherosclerosis at the left CCA origin, but hemodynamically
significant stenosis is suspected, numerically estimated at 60 %
with respect to the distal vessel. Distal to that the left CCA is
patent with mild plaque proximal to the bifurcation. Calcified
plaque at the lateral left ICA origin and bulb without stenosis.
Tortuous cervical left ICA without stenosis to the skull base.

Vertebral arteries:
Mild plaque in the proximal right subclavian artery without
stenosis. Normal right vertebral artery origin. Late entry of the
right vertebral artery into the transverse foramen. The right
vertebral is patent to the skull base without plaque or stenosis.

Moderate plaque of the proximal left subclavian artery with evidence
of high-grade stenosis on series 13, image 89 and series 12, image
301, numerically estimated at 70 % with respect to the distal
vessel. But the left subclavian remains patent. Normal left
vertebral artery origin. Non dominant left vertebral artery with a
tortuous V1 segment. Where as the left vertebral appears more
codominant in [NO]. The left vertebral artery gradually loses
enhancement to the skull base, is nonenhancing in the V3 segment. No
discrete flap or suspicious luminal irregularity is identified.

CTA HEAD

Posterior circulation: Reconstituted enhancement in the left
vertebral V4 segment with abrupt caliber change as seen on series
13, image 95. This results in moderate stenosis proximal to the left
PICA origin which remains patent. No calcified plaque. The distal
right vertebral artery is patent to the basilar and within normal
limits. Patent basilar artery with mild irregularity. Patent SCA and
PCA origins. Both posterior communicating arteries are present.
Right PCA branches are within normal limits. There is mild to
moderate multifocal irregularity of the left P2 segment on series
17, image 19. Proximal left P3 branches are patent but there is a
superior division left P3 or P4 occlusion also on image 19.

Anterior circulation: Both ICA siphons are patent. There is left
cavernous segment calcified plaque. Right supraclinoid mild
calcified plaque, but no siphon stenosis. Normal ophthalmic and
posterior communicating artery origins. Patent carotid termini, MCA
and ACA origins. Mildly dominant right A1. Normal anterior
communicating artery. Bilateral ACA branches are within normal
limits. Left MCA M1 segment and bifurcation are patent without
stenosis. Right MCA M1 segment and bifurcation are patent without
stenosis. Bilateral MCA branches are within normal limits.

Venous sinuses: Early contrast timing, but grossly patent.

Anatomic variants: None significant.

Other findings: There is an oval homogeneously enhancing 14 mm mass
along the left posterosuperior convexity which was isointense on the
noncontrast MRI yesterday. This is most compatible with meningioma
(series 13, image 160).

Review of the MIP images confirms the above findings
IMPRESSION: 1. The Left Vertebral Artery slowly occludes in the left V3 segment,
is reconstituted but abruptly stenotic in the V4 segment, and is
more diminutive in caliber when compared to a [NO] MRI.
Atherosclerotic occlusion is favored over Vertebral Artery
Dissection, and there is pronounced upstream atherosclerosis at the
proximal Left Subclavian Artery with Stenosis there estimated at
70%.

2. Positive also for left Left PCA P3/P4 occlusion, concordant with
the recent left PCA infarct.

3. Positive also for Left CCA origin atherosclerosis with stenosis
numerically estimated at 60%. But there is no significant ICA
stenosis.

4. Expected CT appearance of the Left PCA infarct. No associated
hemorrhage or mass effect. Other advanced cerebral ischemic disease
appears stable from the MRI yesterday.

5. A 14 mm meningioma left para falcine meningioma is apparent
following contrast, likely clinically silent.

6. Aortic Atherosclerosis ([NO]-[NO]).

## 2021-08-14 MED ORDER — CLOPIDOGREL BISULFATE 75 MG PO TABS
75.0000 mg | ORAL_TABLET | Freq: Every day | ORAL | Status: DC
  Administered 2021-08-15 – 2021-08-17 (×3): 75 mg via ORAL
  Filled 2021-08-14 (×3): qty 1

## 2021-08-14 MED ORDER — CLOPIDOGREL BISULFATE 75 MG PO TABS
300.0000 mg | ORAL_TABLET | Freq: Once | ORAL | Status: AC
  Administered 2021-08-14: 300 mg via ORAL
  Filled 2021-08-14: qty 4

## 2021-08-14 MED ORDER — ASPIRIN 325 MG PO TABS
325.0000 mg | ORAL_TABLET | Freq: Once | ORAL | Status: AC
  Administered 2021-08-14: 325 mg via ORAL
  Filled 2021-08-14: qty 1

## 2021-08-14 MED ORDER — IOHEXOL 350 MG/ML SOLN
75.0000 mL | Freq: Once | INTRAVENOUS | Status: AC | PRN
  Administered 2021-08-14: 75 mL via INTRAVENOUS

## 2021-08-14 MED ORDER — ASPIRIN EC 81 MG PO TBEC
81.0000 mg | DELAYED_RELEASE_TABLET | Freq: Every day | ORAL | Status: DC
  Administered 2021-08-15 – 2021-08-17 (×3): 81 mg via ORAL
  Filled 2021-08-14 (×3): qty 1
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
## 2021-08-14 NOTE — Progress Notes (Signed)
Inpatient Rehab Admissions Coordinator Note:   Per PT patient was screened for CIR candidacy by Kaytie Ratcliffe Danford Bad, CCC-SLP. At this time, pt appears to be a potential candidate for CIR. I will place an order for rehab consult for full assessment, per our protocol.  Please contact me any with questions.Gayland Curry, [place], [place] Admissions Coordinator [phone] 08/14/21 5:24 PM

## 2021-08-14 NOTE — Progress Notes (Signed)
SLP Cancellation Note  Patient Details Name: Sandra Robinson MRN: [id number] DOB: 1951/08/23   Cancelled treatment:       Reason Eval/Treat Not Completed: Patient's level of consciousness;Other (comment) (SLP will f/u next 1-2 dates)   Sonia Baller, MA, [place] [facility] Acute Rehab

## 2021-08-14 NOTE — Progress Notes (Signed)
  Echocardiogram 2D Echocardiogram has been performed.  Sandra Robinson 08/14/2021, 12:28 PM
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
## 2021-08-14 NOTE — Progress Notes (Signed)
OT Cancellation Note  Patient Details Name: CAMILLIA CLOUTHIER MRN: [id number] DOB: Apr 19, 1951   Cancelled Treatment:    Reason Eval/Treat Not Completed: Patient at procedure or test/ unavailable (Pt taken down for ECHO at 12:30p. OT to continue to follow for OT eval.)   Jefferey Pica, OTR/L Acute Rehabilitation Services Pager: [phone] Office: [place] 08/14/2021, 1:59 PM

## 2021-08-14 NOTE — Progress Notes (Signed)
EEG completed, results pending. 

## 2021-08-15 ENCOUNTER — Inpatient Hospital Stay (HOSPITAL_COMMUNITY): Payer: Medicare HMO

## 2021-08-15 DIAGNOSIS — I6330 Cerebral infarction due to thrombosis of unspecified cerebral artery: Secondary | ICD-10-CM | POA: Insufficient documentation

## 2021-08-15 LAB — MAGNESIUM: Magnesium: 2.1 mg/dL (ref 1.7–2.4)

## 2021-08-15 LAB — BASIC METABOLIC PANEL
Anion gap: 8 (ref 5–15)
BUN: 21 mg/dL (ref 8–23)
CO2: 21 mmol/L — ABNORMAL LOW (ref 22–32)
Calcium: 9 mg/dL (ref 8.9–10.3)
Chloride: 109 mmol/L (ref 98–111)
Creatinine, Ser: 1.26 mg/dL — ABNORMAL HIGH (ref 0.44–1.00)
GFR, Estimated: 46 mL/min — ABNORMAL LOW (ref >60)
Glucose, Bld: 138 mg/dL — ABNORMAL HIGH (ref 70–99)
Potassium: 3.7 mmol/L (ref 3.5–5.1)
Sodium: 138 mmol/L (ref 135–145)

## 2021-08-15 LAB — CBC
HCT: 44.7 % (ref 36.0–46.0)
Hemoglobin: 15.1 g/dL — ABNORMAL HIGH (ref 12.0–15.0)
MCH: 30.4 pg (ref 26.0–34.0)
MCHC: 33.8 g/dL (ref 30.0–36.0)
MCV: 89.9 fL (ref 80.0–100.0)
Platelets: 242 10*3/uL (ref 150–400)
RBC: 4.97 MIL/uL (ref 3.87–5.11)
RDW: 15.4 % (ref 11.5–15.5)
WBC: 11.7 10*3/uL — ABNORMAL HIGH (ref 4.0–10.5)
nRBC: 0 % (ref 0.0–0.2)

## 2021-08-15 LAB — T3, FREE: T3, Free: 2.8 pg/mL (ref 2.0–4.4)

## 2021-08-15 IMAGING — MR MR HEAD W/ CM
4 series · 19 of 48 positions shown · IV contrast (gadavist)
Comparison: Brain MRI [DATE], CTA head/neck [DATE]

CLINICAL DATA: Presents to the ED [DATE] for confusion found to
have acute left PCA infarct. Evaluate for mass lesion.

EXAM:
MRI HEAD WITH CONTRAST
TECHNIQUE: Multiplanar, multiecho pulse sequences of the brain and surrounding
structures were obtained with intravenous contrast.
CONTRAST:  7.5mL GADAVIST GADOBUTROL 1 MMOL/ML IV SOLN

[Series 3: T1 · axial · 3.0mm · 0.47mm/px · z∈[-64,+44]mm · 3 of 108 slices shown]
[im 18/108]
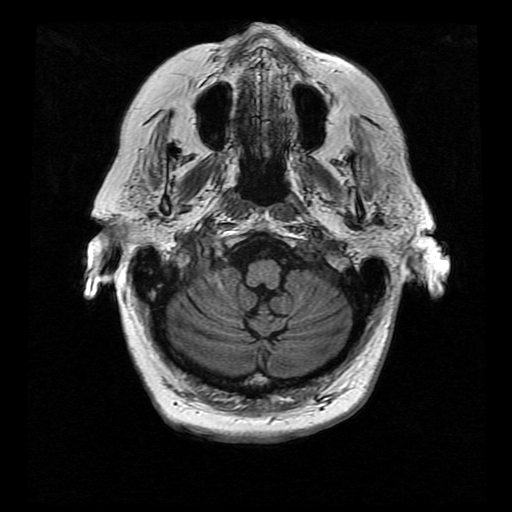
[im 54/108]
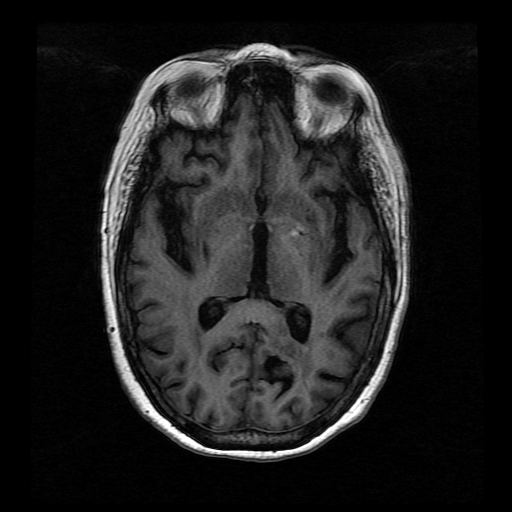
[im 90/108]
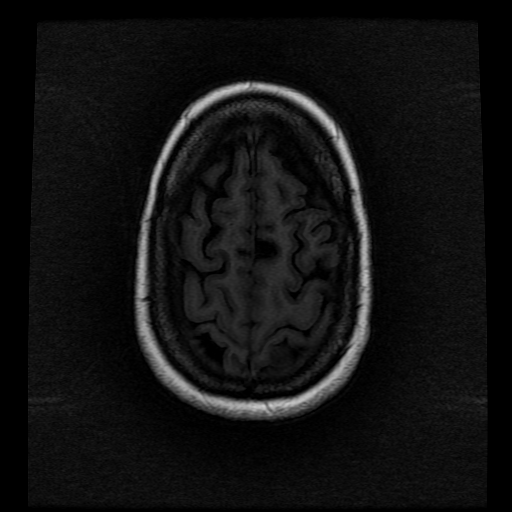

[Series 4: T1 post-contrast · axial · 3.0mm · 0.47mm/px · z∈[-90,+69]mm · 9 of 54 slices shown (1 of 3)]
[im 1/54]
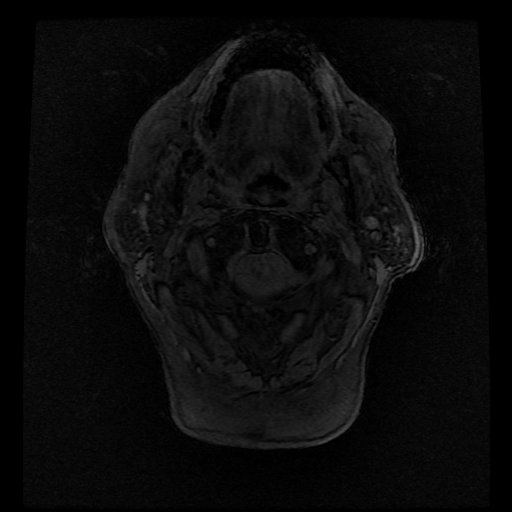
[im 10/54]
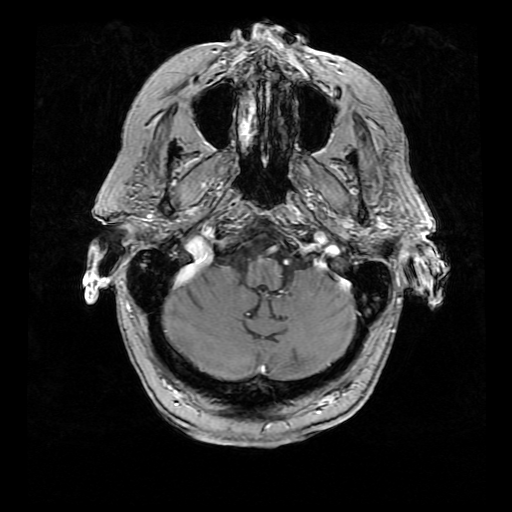
[im 15/54]
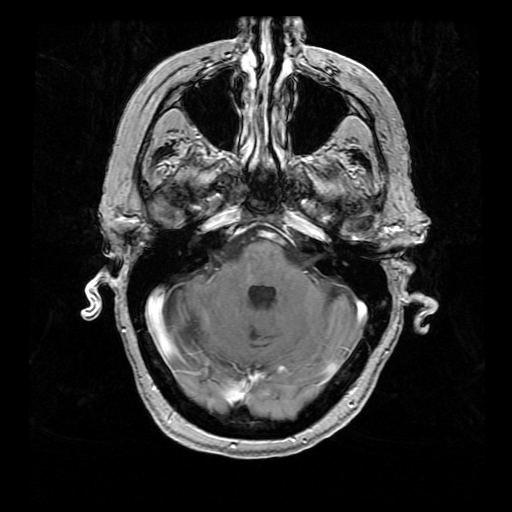
[im 25/54]
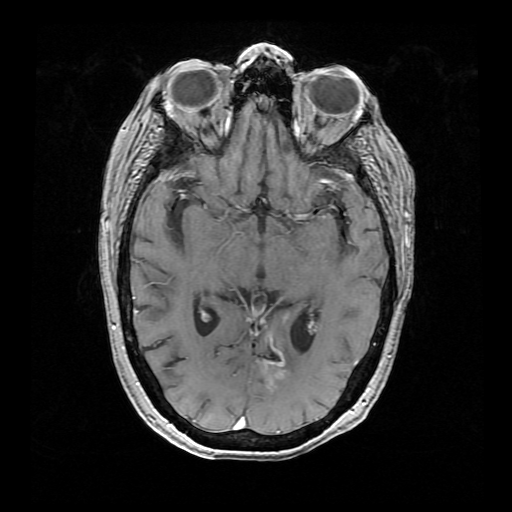
[im 29/54]
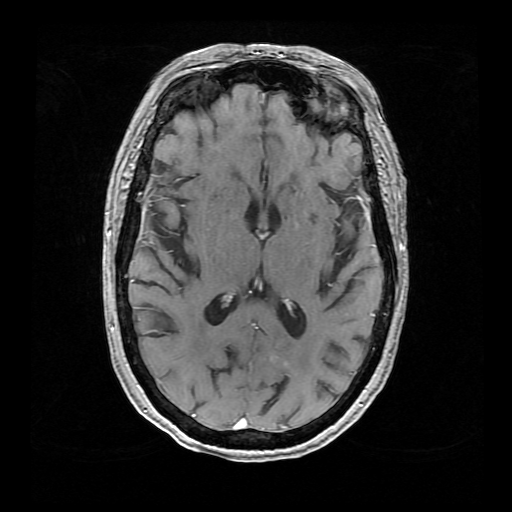
[im 39/54]
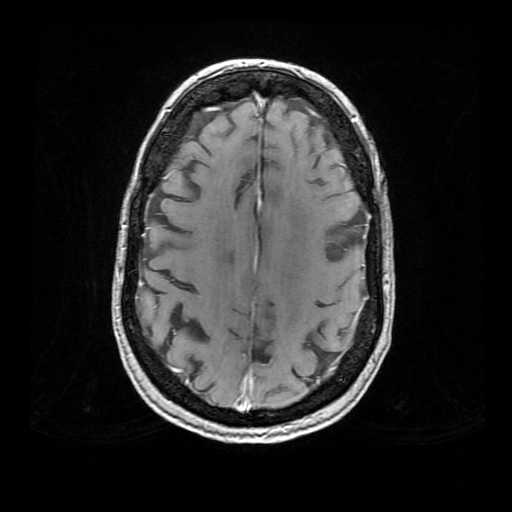
[im 44/54]
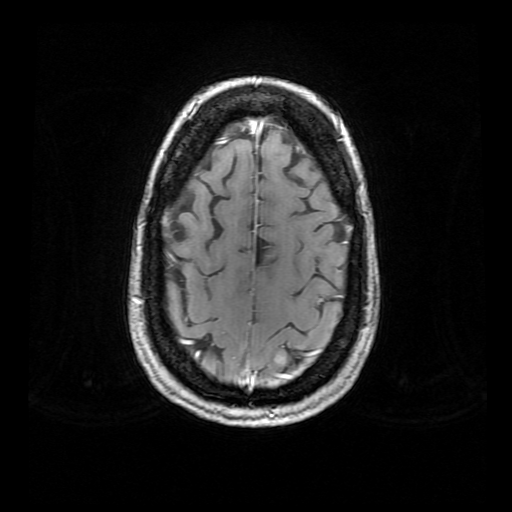
[im 49/54]
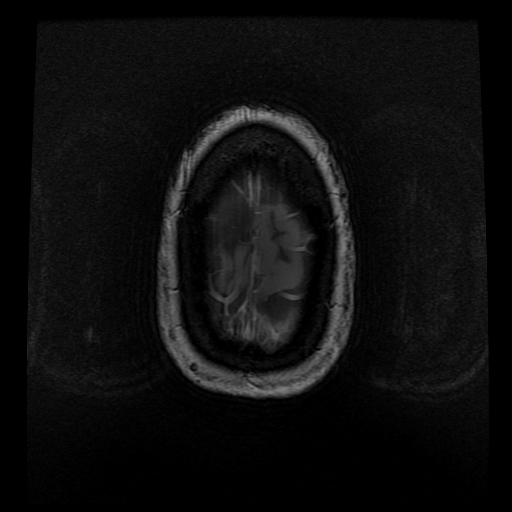
[im 54/54]
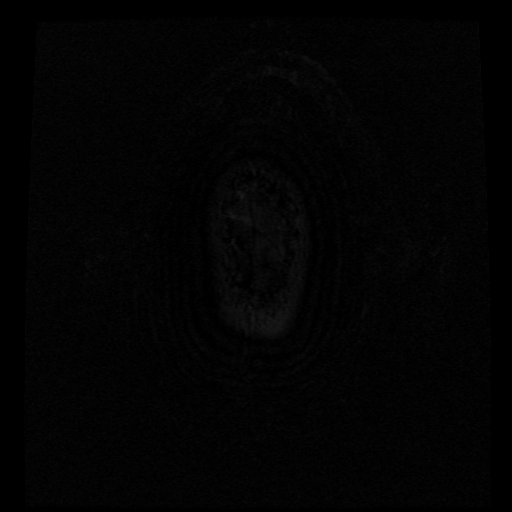

[Series 5: T1 post-contrast · coronal · 5.0mm · 0.43mm/px · 4 of 28 slices shown (2 of 3)]
[im 1/28]
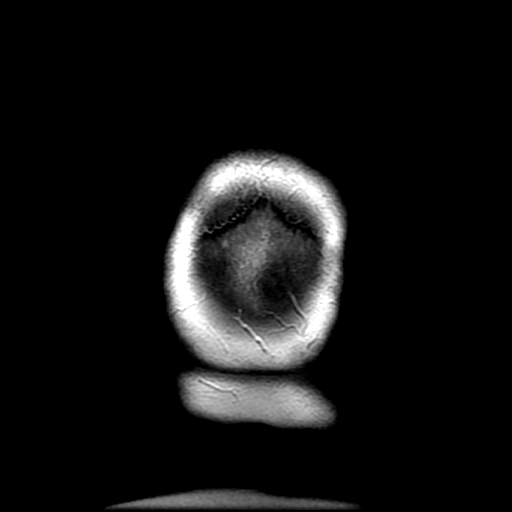
[im 6/28]
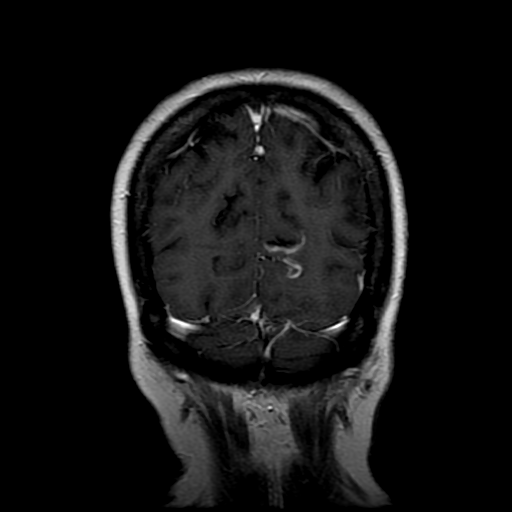
[im 17/28]
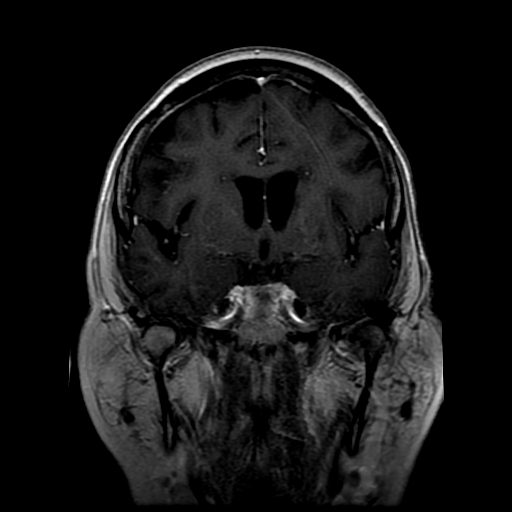
[im 28/28]
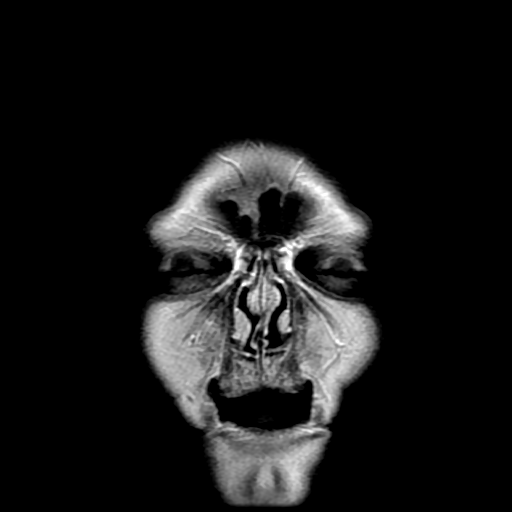

[Series 6: T1 post-contrast · sagittal · 5.0mm · 0.47mm/px · 3 of 24 slices shown (3 of 3)]
[im 1/24]
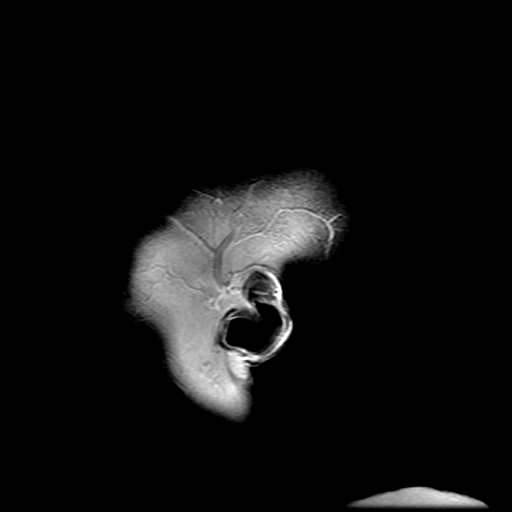
[im 12/24]
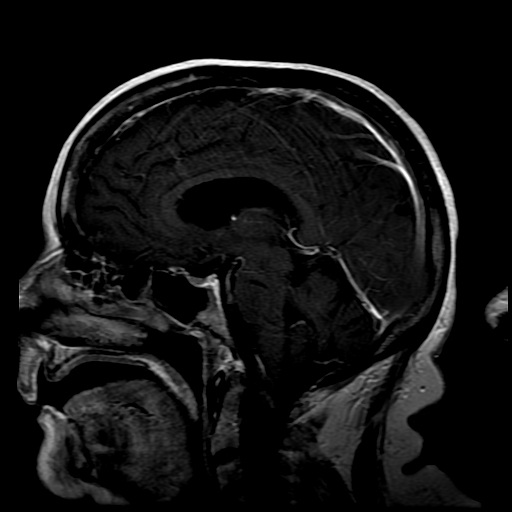
[im 24/24]
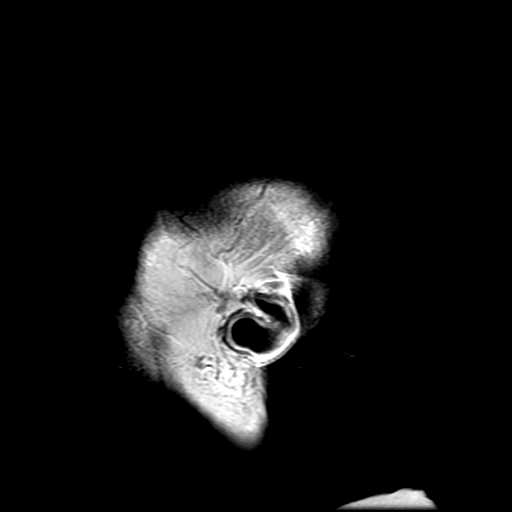

[19 of 48 positions shown; findings below may reference images not displayed]

FINDINGS: Only pre and postcontrast T1 images are included on the current
examination.

Brain: There is a homogeneously enhancing extra-axial lesion
overlying the left parietal lobe measuring 1.5 cm by 1.3 cm by
cm consistent with a meningioma. There is no significant mass effect
on the underlying brain parenchyma.

There is mild curvilinear cortically based enhancement in the left
PCA territory consistent with evolving subacute infarct. There is no
other abnormal enhancement.

Remote lacunar infarcts in the left basal ganglia and corona radiata
are again seen.

Vascular: Normal flow voids.

Skull and upper cervical spine: Normal marrow signal.

Sinuses/Orbits: The paranasal sinuses appear clear. The globes and
orbits are unremarkable.

Other: None.
IMPRESSION: 1. Homogeneously enhancing extra-axial lesion overlying the left
parietal lobe is consistent with a meningioma.
2. Cortically based enhancement in the left PCA territory consistent
with evolving subacute infarct.

## 2021-08-15 MED ORDER — ATORVASTATIN CALCIUM 40 MG PO TABS
40.0000 mg | ORAL_TABLET | Freq: Every day | ORAL | Status: DC
  Administered 2021-08-15 – 2021-08-17 (×3): 40 mg via ORAL
  Filled 2021-08-15 (×3): qty 1

## 2021-08-15 MED ORDER — GADOBUTROL 1 MMOL/ML IV SOLN
7.5000 mL | Freq: Once | INTRAVENOUS | Status: AC | PRN
  Administered 2021-08-15: 7.5 mL via INTRAVENOUS
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
## 2021-08-15 NOTE — Progress Notes (Signed)
OT Cancellation Note  Patient Details Name: Sandra Robinson MRN: [id number] DOB: 1951-10-11   Cancelled Treatment:    Reason Eval/Treat Not Completed: Patient at procedure or test/ unavailable (at MRI)  Bailey, OT/L   Acute OT Clinical Specialist Acute Rehabilitation Services Pager [phone] Office [phone]  08/15/2021, 9:55 AM
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
## 2021-08-15 NOTE — Plan of Care (Signed)
  Problem: Education: Goal: Knowledge of General Education information will improve Description Including pain rating scale, medication(s)/side effects and non-pharmacologic comfort measures Outcome: Progressing   Problem: Health Behavior/Discharge Planning: Goal: Ability to manage health-related needs will improve Outcome: Progressing   Problem: Clinical Measurements: Goal: Ability to maintain clinical measurements within normal limits will improve Outcome: Progressing Goal: Will remain free from infection Outcome: Progressing Goal: Diagnostic test results will improve Outcome: Progressing Goal: Respiratory complications will improve Outcome: Progressing Goal: Cardiovascular complication will be avoided Outcome: Progressing   Problem: Activity: Goal: Risk for activity intolerance will decrease Outcome: Progressing   Problem: Nutrition: Goal: Adequate nutrition will be maintained Outcome: Progressing   Problem: Elimination: Goal: Will not experience complications related to bowel motility Outcome: Progressing Goal: Will not experience complications related to urinary retention Outcome: Progressing   Problem: Pain Managment: Goal: General experience of comfort will improve Outcome: Progressing   Problem: Safety: Goal: Ability to remain free from injury will improve Outcome: Progressing   

## 2021-08-15 NOTE — NC FL2 (Signed)
[place] LEVEL OF CARE SCREENING TOOL     IDENTIFICATION  Patient Name: Sandra Robinson Birthdate: 03-19-1951 Sex: female Admission Date (Current Location): 08/11/2021  [facility] and [place] Number:  [employer] and Address:  [facility], [place] [address], [place], [place] [address]      Provider Number: [id number]  Attending Physician Name and Address:  Flora Lipps, MD  Relative Name and Phone Number:       Current Level of Care: Hospital Recommended Level of Care: [place] Prior Approval Number:    Date Approved/Denied:   PASRR Number: pending  Discharge Plan: SNF    Current Diagnoses: Patient Active Problem List   Diagnosis Date Noted   Cerebral thrombosis with cerebral infarction 08/15/2021   Encephalopathy 08/12/2021   Acute encephalopathy 08/11/2021   Hypokalemia 08/11/2021    Orientation RESPIRATION BLADDER Height & Weight     Self  Normal Incontinent Weight:   Height:  '5\' 3"'$  (160 cm)  BEHAVIORAL SYMPTOMS/MOOD NEUROLOGICAL BOWEL NUTRITION STATUS      Continent Diet (Heart healthy with thin liquids)  AMBULATORY STATUS COMMUNICATION OF NEEDS Skin   Limited Assist Verbally Normal                       Personal Care Assistance Level of Assistance  Bathing, Feeding, Dressing Bathing Assistance: Maximum assistance Feeding assistance: Limited assistance Dressing Assistance: Limited assistance     Functional Limitations Info  Sight, Hearing, Speech Sight Info: Adequate Hearing Info: Adequate Speech Info: Adequate    SPECIAL CARE FACTORS FREQUENCY  PT (By licensed PT), OT (By licensed OT), Speech therapy     PT Frequency: 5x/wk OT Frequency: 5x/wk     Speech Therapy Frequency: 5x/wk      Contractures Contractures Info: Not present    Additional Factors Info  Code Status, Allergies, Psychotropic Code Status Info: Full Allergies Info: Latex/ morphine/ oxycodone/  tape Psychotropic Info: Buspar 30 mg BiD/ Neurontin 800 mg 4 times a day/ Zoloft 100 mg BiD/ Topamax 200 mg BiD/ Trazadone 350 mg at bedtime         Current Medications (08/15/2021):  This is the current hospital active medication list Current Facility-Administered Medications  Medication Dose Route Frequency Provider Last Rate Last Admin   aspirin EC tablet 81 mg  81 mg Oral Daily Anibal Henderson W, NP   81 mg at 08/15/21 1050   atorvastatin (LIPITOR) tablet 40 mg  40 mg Oral QHS Pokhrel, Laxman, MD       busPIRone (BUSPAR) tablet 30 mg  30 mg Oral BID Pokhrel, Laxman, MD   30 mg at 08/15/21 1050   clopidogrel (PLAVIX) tablet 75 mg  75 mg Oral Daily Anibal Henderson W, NP   75 mg at 08/15/21 1050   gabapentin (NEURONTIN) capsule 800 mg  800 mg Oral QID Pokhrel, Laxman, MD   800 mg at 08/15/21 1422   sertraline (ZOLOFT) tablet 100 mg  100 mg Oral BID Pokhrel, Laxman, MD   100 mg at 08/15/21 1050   topiramate (TOPAMAX) tablet 200 mg  200 mg Oral BID Pokhrel, Laxman, MD   200 mg at 08/15/21 1050   traZODone (DESYREL) tablet 350 mg  350 mg Oral QHS Pokhrel, Laxman, MD   350 mg at 08/14/21 2251   vitamin B-12 (CYANOCOBALAMIN) tablet 1,000 mcg  1,000 mcg Oral Daily Pokhrel, Laxman, MD   1,000 mcg at 08/15/21 1050  Discharge Medications: Please see discharge summary for a list of discharge medications.  Relevant Imaging Results:  Relevant Lab Results:   Additional Information SS#: [id number]  Pollie Friar, RN
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
## 2021-08-15 NOTE — Progress Notes (Signed)
Inpatient Rehabilitation Admissions Coordinator   Inpatient rehab consult received. I met with patient at bedside with her nurse. TOC CM, Kelli, is attempting to contact family to notify of her admission. I will follow up once we have family contact verified.  Danne Baxter, RN, MSN Rehab Admissions Coordinator [phone] 08/15/2021 11:07 AM

## 2021-08-15 NOTE — Progress Notes (Signed)
Inpatient Rehabilitation Admissions Coordinator  Discussed with CM, Kelli. Patient will need SNF at this time. We will sign off at this time.  Danne Baxter, RN, MSN Rehab Admissions Coordinator [phone] 08/15/2021 7:38 PM

## 2021-08-15 NOTE — Progress Notes (Signed)
Re: Sandra Robinson DOB: [birth year] Date: 08/15/2021   To Whom It May Concern:  Please be advised that the above-named patient will require a short-term nursing home stay--anticipated 30 days or less for rehabilitation and strengthening. The plan is for home.
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
## 2021-08-16 LAB — RAPID URINE DRUG SCREEN, HOSP PERFORMED
Amphetamines: NOT DETECTED
Barbiturates: NOT DETECTED
Benzodiazepines: NOT DETECTED
Cocaine: NOT DETECTED
Opiates: NOT DETECTED
Tetrahydrocannabinol: NOT DETECTED

## 2021-08-16 MED ORDER — HYDRALAZINE HCL 25 MG PO TABS: 25.0000 mg | ORAL_TABLET | Freq: Four times a day (QID) | ORAL | Status: DC | PRN

## 2021-08-16 NOTE — Care Management Important Message (Signed)
Important Message  Patient Details  Name: Sandra Robinson MRN: [id number] Date of Birth: 1951/06/02   Medicare Important Message Given:  Yes     Orbie Pyo 08/16/2021, 4:15 PM

## 2021-08-16 NOTE — TOC Progression Note (Addendum)
Transition of Care [facility]) - Progression Note    Patient Details  Name: Sandra Robinson MRN: [id number] Date of Birth: [birth year]  Transition of Care [facility]) CM/SW Contact  Pollie Friar, RN Phone Number: 08/16/2021, 1:04 PM  Clinical Narrative:    CM called son, Sandra Robinson with bed offers. He lives in [place] and has no idea of the facilities here. He was interested in her neighbor, that helps her some, to help make the decision since she is local. CM called Sandra Robinson (neighbor) and she was provided the choices and selected: [facility] CM has updated Sandra Robinson at [facility].  Pt has regular [employer] Medicare. The facility made aware and will begin insurance authorization. Pt has not had the covid vaccines. She will need covid test prior to d/c.  TOC following.  1430: Pts Sandra Robinson visited at the hospital. CM met with Sandra Robinson and went over the plan. He is in agreement. CM had a long talk with Sandra Robinson about the family coming together to make decisions for after rehab. Pt will need 24 hour supervision and assistance. Sandra Robinson to talk with family. Contact numbers also updated.    Expected Discharge Plan: [place] Barriers to Discharge: Continued Medical Work up  Expected Discharge Plan and Services Expected Discharge Plan: [place] In-house Referral: Clinical Social Work Discharge Planning Services: CM Consult Post Acute Care Choice: [place] arrangements for the past 2 months: Single Family Home                                       Social Determinants of Health (SDOH) Interventions    Readmission Risk Interventions No flowsheet data found.
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
## 2021-08-16 NOTE — Plan of Care (Signed)

## 2021-08-17 ENCOUNTER — Encounter (HOSPITAL_COMMUNITY)
Admission: EM | Disposition: A | Discharge status: Skilled Nursing Facility | Payer: Self-pay | Source: Home / Self Care | Attending: Internal Medicine

## 2021-08-17 DIAGNOSIS — I6389 Other cerebral infarction: Secondary | ICD-10-CM | POA: Diagnosis not present

## 2021-08-17 HISTORY — PX: LOOP RECORDER INSERTION: EP1214

## 2021-08-17 LAB — BASIC METABOLIC PANEL
Anion gap: 6 (ref 5–15)
BUN: 22 mg/dL (ref 8–23)
CO2: 22 mmol/L (ref 22–32)
Calcium: 9.1 mg/dL (ref 8.9–10.3)
Chloride: 111 mmol/L (ref 98–111)
Creatinine, Ser: 1.15 mg/dL — ABNORMAL HIGH (ref 0.44–1.00)
GFR, Estimated: 52 mL/min — ABNORMAL LOW (ref >60)
Glucose, Bld: 101 mg/dL — ABNORMAL HIGH (ref 70–99)
Potassium: 3.5 mmol/L (ref 3.5–5.1)
Sodium: 139 mmol/L (ref 135–145)

## 2021-08-17 LAB — CBC
HCT: 41.2 % (ref 36.0–46.0)
Hemoglobin: 13.6 g/dL (ref 12.0–15.0)
MCH: 29.9 pg (ref 26.0–34.0)
MCHC: 33 g/dL (ref 30.0–36.0)
MCV: 90.5 fL (ref 80.0–100.0)
Platelets: 211 10*3/uL (ref 150–400)
RBC: 4.55 MIL/uL (ref 3.87–5.11)
RDW: 14.9 % (ref 11.5–15.5)
WBC: 8.9 10*3/uL (ref 4.0–10.5)
nRBC: 0 % (ref 0.0–0.2)

## 2021-08-17 LAB — RESP PANEL BY RT-PCR (FLU A&B, COVID) ARPGX2
Influenza A by PCR: NEGATIVE
Influenza B by PCR: NEGATIVE
SARS Coronavirus 2 by RT PCR: NEGATIVE

## 2021-08-17 LAB — MAGNESIUM: Magnesium: 2 mg/dL (ref 1.7–2.4)

## 2021-08-17 LAB — METHYLMALONIC ACID, SERUM: Methylmalonic Acid, Quantitative: 195 nmol/L (ref 0–378)

## 2021-08-17 SURGERY — LOOP RECORDER INSERTION

## 2021-08-17 MED ORDER — ATORVASTATIN CALCIUM 40 MG PO TABS: 40.0000 mg | ORAL_TABLET | Freq: Every day | ORAL | 1 refills | Status: DC

## 2021-08-17 MED ORDER — CYANOCOBALAMIN 1000 MCG PO TABS: 1000.0000 ug | ORAL_TABLET | Freq: Every day | ORAL | Status: AC

## 2021-08-17 MED ORDER — CLOPIDOGREL BISULFATE 75 MG PO TABS: 75.0000 mg | ORAL_TABLET | Freq: Every day | ORAL | 0 refills | Status: AC

## 2021-08-17 MED ORDER — LIDOCAINE-EPINEPHRINE 1 %-1:100000 IJ SOLN
INTRAMUSCULAR | Status: DC | PRN
Start: 2021-08-17 — End: 2021-08-17
  Administered 2021-08-17: 10 mL

## 2021-08-17 MED ORDER — LIDOCAINE-EPINEPHRINE 1 %-1:100000 IJ SOLN
INTRAMUSCULAR | Status: AC
  Filled 2021-08-17: qty 1

## 2021-08-17 MED ORDER — ASPIRIN 81 MG PO TBEC: 81.0000 mg | DELAYED_RELEASE_TABLET | Freq: Every day | ORAL | 11 refills | Status: DC

## 2021-08-17 SURGICAL SUPPLY — 2 items
MONITOR MOBILE MNGR LINQ22 (Prosthesis & Implant Heart) ×1 IMPLANT
PACK LOOP INSERTION (CUSTOM PROCEDURE TRAY) ×2 IMPLANT

## 2021-08-17 NOTE — Discharge Summary (Signed)
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 of SARS-CoV-2 by FDA under an Emergency Use Authorization (EUA). This EUA will remain  in effect (meaning this test can be used) for the duration of the COVID-19 declaration under Section 564(b)(1) of the Act, 21 U.S.C.section 360bbb-3(b)(1), unless the authorization is terminated  or revoked sooner.       Influenza A by PCR NEGATIVE NEGATIVE Final   Influenza B by PCR NEGATIVE NEGATIVE Final    Comment: (NOTE) The Xpert Xpress SARS-CoV-2/FLU/RSV plus assay is intended as an aid in the diagnosis of influenza from Nasopharyngeal swab specimens and should not be used as a sole basis for treatment. Nasal washings and aspirates are unacceptable for Xpert Xpress SARS-CoV-2/FLU/RSV testing.  Fact Sheet for Patients: EntrepreneurPulse.com.au  Fact  Sheet for Healthcare Providers: IncredibleEmployment.be  This test is not yet approved or cleared by the [place] and has been authorized for detection and/or diagnosis of SARS-CoV-2 by FDA under an Emergency Use Authorization (EUA). This EUA  will remain in effect (meaning this test can be used) for the duration of the COVID-19 declaration under Section 564(b)(1) of the Act, 21 U.S.C. section 360bbb-3(b)(1), unless the authorization is terminated or revoked.  Performed at [facility], [place] [address], [place], [place] [address]   Resp Panel by RT-PCR (Flu A&B, Covid) Nasopharyngeal Swab     Status: None   Collection Time: 08/17/21 11:39 AM   Specimen: Nasopharyngeal Swab; Nasopharyngeal(NP) swabs in vial transport medium  Result Value Ref Range Status   SARS Coronavirus 2 by RT PCR NEGATIVE NEGATIVE Final    Comment: (NOTE) SARS-CoV-2 target nucleic acids are NOT DETECTED.  The SARS-CoV-2 RNA is generally detectable in upper respiratory specimens during the acute phase of infection. The lowest concentration of SARS-CoV-2 viral copies this assay can detect is 138 copies/mL. A negative result does not preclude SARS-Cov-2 infection and should not be used as the sole basis for treatment or other patient management decisions. A negative result may occur with  improper specimen collection/handling, submission of specimen other than nasopharyngeal swab, presence of viral mutation(s) within the areas targeted by this assay, and inadequate number of viral copies(<138 copies/mL). A negative result must be combined with clinical observations, patient history, and epidemiological information. The expected result is Negative.  Fact Sheet for Patients:  EntrepreneurPulse.com.au  Fact Sheet for Healthcare Providers:  IncredibleEmployment.be  This test is no t yet approved or cleared by the [place] FDA and  has been authorized for detection and/or diagnosis of SARS-CoV-2 by FDA under an Emergency Use Authorization (EUA). This EUA will remain  in effect (meaning this test can be used) for the duration of the COVID-19 declaration under Section 564(b)(1) of the Act, 21 U.S.C.section  360bbb-3(b)(1), unless the authorization is terminated  or revoked sooner.       Influenza A by PCR NEGATIVE NEGATIVE Final   Influenza B by PCR NEGATIVE NEGATIVE Final    Comment: (NOTE) The Xpert Xpress SARS-CoV-2/FLU/RSV plus assay is intended as an aid in the diagnosis of influenza from Nasopharyngeal swab specimens and should not be used as a sole basis for treatment. Nasal washings and aspirates are unacceptable for Xpert Xpress SARS-CoV-2/FLU/RSV testing.  Fact Sheet for Patients: EntrepreneurPulse.com.au  Fact Sheet for Healthcare Providers: IncredibleEmployment.be  This test is not yet approved or cleared by the [place] FDA and has been authorized for detection and/or diagnosis of SARS-CoV-2 by FDA under an Emergency Use Authorization (EUA). This EUA will remain in effect (meaning this test can be used) for the duration of the COVID-19 declaration under Section 564(b)(1) of the Act, 21 U.S.C. section 360bbb-3(b)(1), unless the authorization is terminated or revoked.  Performed at [facility], [place] [address], [place], [place] [address]      Labs: Basic Metabolic Panel: Recent Labs  Lab 08/12/21 0300 08/12/21 0811 08/12/21 1657 08/13/21 0503 08/15/21 1420 08/17/21 0352  NA  --  139 139 138 138 139  K  --  2.9* 3.2* 3.0* 3.7 3.5  CL  --  110 109 107 109 111  CO2  --  24 21* 22 21* 22  GLUCOSE  --  116* 113* 121* 138* 101*  BUN  --  7* 8 <5* 21 22  CREATININE  --  1.01* 1.02* 0.85 1.26* 1.15*  CALCIUM  --  8.8* 9.0 9.2 9.0 9.1  MG 1.8  --   --  1.7 2.1 2.0   Liver Function Tests: Recent Labs  Lab 08/11/21 1802  AST 28  ALT 23  ALKPHOS 55  BILITOT 0.7  PROT 6.8  ALBUMIN 3.6   CBC: Recent Labs  Lab 08/11/21 1802 08/11/21 1828 08/13/21 0503 08/15/21 1420 08/17/21 0352  WBC 9.7  --  11.0* 11.7* 8.9  NEUTROABS 5.7  --   --   --   --   HGB 14.5 14.3 14.3 15.1* 13.6  HCT 43.1 42.0 41.2 44.7 41.2   MCV 88.9  --  86.4 89.9 90.5  PLT 219  --  238 242 211   CBG: No results for input(s): GLUCAP in the last 168 hours. Hgb A1c No results for input(s): HGBA1C in the last 72 hours. Lipid Profile No results for input(s): CHOL, HDL, LDLCALC, TRIG, CHOLHDL, LDLDIRECT in the last 72 hours. Thyroid function studies No results for input(s): TSH, T4TOTAL, T3FREE, THYROIDAB in the last 72 hours.  Invalid input(s): FREET3 Urinalysis No results found for: COLORURINE, APPEARANCEUR, LABSPEC, PHURINE, GLUCOSEU, HGBUR, BILIRUBINUR, KETONESUR, PROTEINUR, UROBILINOGEN, NITRITE, LEUKOCYTESUR  FURTHER DISCHARGE INSTRUCTIONS:   Get Medicines reviewed and adjusted: Please take all your medications with you for your next visit with your Primary MD   Laboratory/radiological data: Please request your Primary MD to go over all hospital tests and procedure/radiological results at the follow up, please ask your Primary MD to get all Hospital records sent to his/her office.   In some cases, they will be blood work, cultures and biopsy results pending at the time of your discharge. Please request that your primary care M.D. goes through all the records of your hospital data and follows up on these results.   Also Note the following: If you experience worsening of your admission symptoms, develop shortness of breath, life threatening emergency, suicidal or homicidal thoughts you must seek medical attention immediately by calling 911 or calling your MD immediately  if symptoms less severe.   You must read complete instructions/literature along with all the possible adverse reactions/side effects for all the Medicines you take and that have been prescribed to you. Take any new Medicines after you have completely understood and accpet all the possible adverse reactions/side effects.    Do not drive when taking Pain medications or sleeping medications (Benzodaizepines)   Do not take more than prescribed Pain, Sleep  and Anxiety Medications. It is not advisable to combine anxiety,sleep and pain medications without talking with your primary care practitioner   Special Instructions: If you have smoked or chewed Tobacco  in the last 2 yrs please stop smoking, stop any regular Alcohol  and or any Recreational drug use.   Wear Seat belts while driving.   Please note: You were cared for by a hospitalist during your hospital stay. Once you are discharged, your primary care physician will handle any further medical issues. Please note that NO REFILLS for any discharge medications will be authorized once you are discharged, as it is imperative that you return to your primary care physician (or establish a relationship with a primary care physician if you do not have one) for your post hospital discharge needs so that they can reassess your need for medications and monitor your lab values.  Time coordinating discharge: 40 minutes  SIGNED:  Marzetta Board, MD, PhD 08/17/2021, 1:02 PM
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
## 2021-08-17 NOTE — Plan of Care (Signed)
  Problem: Clinical Measurements: Goal: Will remain free from infection Outcome: Progressing Goal: Diagnostic test results will improve Outcome: Progressing Goal: Respiratory complications will improve Outcome: Progressing Goal: Cardiovascular complication will be avoided Outcome: Progressing   
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
## 2021-08-17 NOTE — TOC Transition Note (Signed)
Transition of Care [facility]) - CM/SW Discharge Note   Patient Details  Name: Sandra Robinson MRN: [id number] Date of Birth: December 27, 1950  [facility] CM/SW Contact:  Pollie Friar, RN Phone Number: 08/17/2021, 3:30 PM   Clinical Narrative:    Patient discharging to [facility] today. [facility] to provide transport. Bedside RN updated and d/c packet is at the desk.  Room: 113 Number for report: [phone]   Final next level of care: Skilled Nursing Facility Barriers to Discharge: No Barriers Identified   Patient Goals and CMS Choice   CMS Medicare.gov Compare Post Acute Care list provided to:: Patient Represenative (must comment) Choice offered to / list presented to : Adult Children  Discharge Placement PASRR number recieved: 08/16/21            Patient chooses bed at: [facility] Patient to be transferred to facility by: [facility] Name of family member notified: Pete--son Patient and family notified of of transfer: 08/17/21  Discharge Plan and Services In-house Referral: Clinical Social Work Discharge Planning Services: CM Consult Post Acute Care Choice: [facility]                               Social Determinants of Health (SDOH) Interventions     Readmission Risk Interventions No flowsheet data found.

## 2021-08-17 NOTE — Discharge Instructions (Signed)
Post procedure wound care instructions Keep incision clean and dry for 3 days. You can remove outer dressing tomorrow. Leave steri-strips (little pieces of tape) on until seen in the office for wound check appointment. Call the office ([phone]) for redness, drainage, swelling, or fever.  

## 2021-08-17 NOTE — Progress Notes (Signed)
Report called in to [facility] at [facility]. Pt slotted for room 113. All questions and concerns addressed.

## 2021-08-18 ENCOUNTER — Encounter (HOSPITAL_COMMUNITY): Payer: Self-pay | Admitting: Cardiology

## 2021-08-18 ENCOUNTER — Encounter: Payer: Self-pay | Admitting: Internal Medicine

## 2021-08-18 DIAGNOSIS — R5381 Other malaise: Secondary | ICD-10-CM | POA: Diagnosis not present

## 2021-08-18 DIAGNOSIS — R531 Weakness: Secondary | ICD-10-CM | POA: Diagnosis not present

## 2021-08-18 DIAGNOSIS — I69398 Other sequelae of cerebral infarction: Secondary | ICD-10-CM | POA: Diagnosis not present

## 2021-08-18 DIAGNOSIS — I69811 Memory deficit following other cerebrovascular disease: Secondary | ICD-10-CM | POA: Diagnosis not present

## 2021-08-18 DIAGNOSIS — Z743 Need for continuous supervision: Secondary | ICD-10-CM | POA: Diagnosis not present

## 2021-08-18 DIAGNOSIS — G9340 Encephalopathy, unspecified: Secondary | ICD-10-CM | POA: Diagnosis not present

## 2021-08-18 DIAGNOSIS — M6281 Muscle weakness (generalized): Secondary | ICD-10-CM | POA: Diagnosis not present

## 2021-08-18 DIAGNOSIS — R5383 Other fatigue: Secondary | ICD-10-CM | POA: Diagnosis not present

## 2021-08-18 DIAGNOSIS — I517 Cardiomegaly: Secondary | ICD-10-CM | POA: Diagnosis not present

## 2021-08-18 DIAGNOSIS — R2681 Unsteadiness on feet: Secondary | ICD-10-CM | POA: Diagnosis not present

## 2021-08-18 DIAGNOSIS — S50811A Abrasion of right forearm, initial encounter: Secondary | ICD-10-CM | POA: Diagnosis not present

## 2021-08-18 DIAGNOSIS — E46 Unspecified protein-calorie malnutrition: Secondary | ICD-10-CM | POA: Diagnosis not present

## 2021-08-18 DIAGNOSIS — F339 Major depressive disorder, recurrent, unspecified: Secondary | ICD-10-CM | POA: Diagnosis not present

## 2021-08-18 DIAGNOSIS — S51811D Laceration without foreign body of right forearm, subsequent encounter: Secondary | ICD-10-CM | POA: Diagnosis not present

## 2021-08-18 DIAGNOSIS — F411 Generalized anxiety disorder: Secondary | ICD-10-CM | POA: Diagnosis not present

## 2021-08-18 DIAGNOSIS — J449 Chronic obstructive pulmonary disease, unspecified: Secondary | ICD-10-CM | POA: Diagnosis not present

## 2021-08-18 DIAGNOSIS — E785 Hyperlipidemia, unspecified: Secondary | ICD-10-CM | POA: Diagnosis not present

## 2021-08-18 DIAGNOSIS — I10 Essential (primary) hypertension: Secondary | ICD-10-CM | POA: Diagnosis not present

## 2021-08-18 DIAGNOSIS — F419 Anxiety disorder, unspecified: Secondary | ICD-10-CM | POA: Diagnosis not present

## 2021-08-18 DIAGNOSIS — L24A2 Irritant contact dermatitis due to fecal, urinary or dual incontinence: Secondary | ICD-10-CM | POA: Diagnosis not present

## 2021-08-18 NOTE — Plan of Care (Signed)

## 2021-08-24 DIAGNOSIS — S50811A Abrasion of right forearm, initial encounter: Secondary | ICD-10-CM | POA: Diagnosis not present

## 2021-08-24 DIAGNOSIS — L24A2 Irritant contact dermatitis due to fecal, urinary or dual incontinence: Secondary | ICD-10-CM | POA: Diagnosis not present

## 2021-08-25 DIAGNOSIS — F419 Anxiety disorder, unspecified: Secondary | ICD-10-CM | POA: Diagnosis not present

## 2021-08-25 DIAGNOSIS — J449 Chronic obstructive pulmonary disease, unspecified: Secondary | ICD-10-CM | POA: Diagnosis not present

## 2021-08-25 DIAGNOSIS — R531 Weakness: Secondary | ICD-10-CM | POA: Diagnosis not present

## 2021-08-25 DIAGNOSIS — E785 Hyperlipidemia, unspecified: Secondary | ICD-10-CM | POA: Diagnosis not present

## 2021-08-29 DIAGNOSIS — E46 Unspecified protein-calorie malnutrition: Secondary | ICD-10-CM | POA: Diagnosis not present

## 2021-08-29 DIAGNOSIS — I69398 Other sequelae of cerebral infarction: Secondary | ICD-10-CM | POA: Diagnosis not present

## 2021-08-29 DIAGNOSIS — F339 Major depressive disorder, recurrent, unspecified: Secondary | ICD-10-CM | POA: Diagnosis not present

## 2021-08-29 DIAGNOSIS — I10 Essential (primary) hypertension: Secondary | ICD-10-CM | POA: Diagnosis not present

## 2021-08-29 DIAGNOSIS — M6281 Muscle weakness (generalized): Secondary | ICD-10-CM | POA: Diagnosis not present

## 2021-08-29 DIAGNOSIS — F411 Generalized anxiety disorder: Secondary | ICD-10-CM | POA: Diagnosis not present

## 2021-08-29 DIAGNOSIS — G9340 Encephalopathy, unspecified: Secondary | ICD-10-CM | POA: Diagnosis not present

## 2021-08-29 DIAGNOSIS — J449 Chronic obstructive pulmonary disease, unspecified: Secondary | ICD-10-CM | POA: Diagnosis not present

## 2021-08-29 DIAGNOSIS — I517 Cardiomegaly: Secondary | ICD-10-CM | POA: Diagnosis not present

## 2021-08-31 ENCOUNTER — Ambulatory Visit: Payer: Medicare HMO

## 2021-08-31 ENCOUNTER — Telehealth: Payer: Self-pay

## 2021-08-31 DIAGNOSIS — L24A2 Irritant contact dermatitis due to fecal, urinary or dual incontinence: Secondary | ICD-10-CM | POA: Diagnosis not present

## 2021-08-31 DIAGNOSIS — S51811D Laceration without foreign body of right forearm, subsequent encounter: Secondary | ICD-10-CM | POA: Diagnosis not present

## 2021-08-31 NOTE — Telephone Encounter (Signed)
The patient monitor has not transmitted. It looks like she still in the hospital as she no show her 8:40 wound check appointment.

## 2021-08-31 NOTE — Telephone Encounter (Signed)
Lmovm for patient to return device clinic call. She needs to go into the phone app and hit the "got its".

## 2021-09-14 NOTE — Telephone Encounter (Signed)
Lmovm for patient to return device clinic call.

## 2021-10-09 DIAGNOSIS — R404 Transient alteration of awareness: Secondary | ICD-10-CM | POA: Diagnosis not present

## 2021-10-09 DIAGNOSIS — R4182 Altered mental status, unspecified: Secondary | ICD-10-CM | POA: Diagnosis not present

## 2021-10-09 DIAGNOSIS — J431 Panlobular emphysema: Secondary | ICD-10-CM | POA: Diagnosis not present

## 2021-10-09 DIAGNOSIS — R410 Disorientation, unspecified: Secondary | ICD-10-CM | POA: Diagnosis not present

## 2021-10-09 DIAGNOSIS — N3000 Acute cystitis without hematuria: Secondary | ICD-10-CM | POA: Diagnosis not present

## 2021-10-09 DIAGNOSIS — F3170 Bipolar disorder, currently in remission, most recent episode unspecified: Secondary | ICD-10-CM | POA: Diagnosis present

## 2021-10-09 DIAGNOSIS — G9389 Other specified disorders of brain: Secondary | ICD-10-CM | POA: Diagnosis not present

## 2021-10-09 DIAGNOSIS — G9341 Metabolic encephalopathy: Secondary | ICD-10-CM | POA: Diagnosis not present

## 2021-10-09 DIAGNOSIS — I10 Essential (primary) hypertension: Secondary | ICD-10-CM | POA: Diagnosis not present

## 2021-10-09 DIAGNOSIS — R531 Weakness: Secondary | ICD-10-CM | POA: Diagnosis not present

## 2021-10-09 DIAGNOSIS — B3731 Acute candidiasis of vulva and vagina: Secondary | ICD-10-CM | POA: Diagnosis not present

## 2021-10-09 DIAGNOSIS — I6782 Cerebral ischemia: Secondary | ICD-10-CM | POA: Diagnosis not present

## 2021-10-09 DIAGNOSIS — A6004 Herpesviral vulvovaginitis: Secondary | ICD-10-CM | POA: Diagnosis not present

## 2021-10-09 DIAGNOSIS — F17210 Nicotine dependence, cigarettes, uncomplicated: Secondary | ICD-10-CM | POA: Diagnosis not present

## 2021-10-09 DIAGNOSIS — Z8673 Personal history of transient ischemic attack (TIA), and cerebral infarction without residual deficits: Secondary | ICD-10-CM | POA: Diagnosis not present

## 2021-10-10 DIAGNOSIS — B3731 Acute candidiasis of vulva and vagina: Secondary | ICD-10-CM | POA: Diagnosis not present

## 2021-10-10 DIAGNOSIS — Z8673 Personal history of transient ischemic attack (TIA), and cerebral infarction without residual deficits: Secondary | ICD-10-CM | POA: Diagnosis not present

## 2021-10-10 DIAGNOSIS — N3000 Acute cystitis without hematuria: Secondary | ICD-10-CM | POA: Diagnosis not present

## 2021-10-10 DIAGNOSIS — G9341 Metabolic encephalopathy: Secondary | ICD-10-CM | POA: Diagnosis not present

## 2021-10-10 DIAGNOSIS — F3170 Bipolar disorder, currently in remission, most recent episode unspecified: Secondary | ICD-10-CM | POA: Diagnosis not present

## 2021-10-10 DIAGNOSIS — J431 Panlobular emphysema: Secondary | ICD-10-CM | POA: Diagnosis not present

## 2021-10-10 DIAGNOSIS — A6004 Herpesviral vulvovaginitis: Secondary | ICD-10-CM | POA: Diagnosis not present

## 2021-10-10 DIAGNOSIS — F17210 Nicotine dependence, cigarettes, uncomplicated: Secondary | ICD-10-CM | POA: Diagnosis not present

## 2021-10-11 DIAGNOSIS — G9341 Metabolic encephalopathy: Secondary | ICD-10-CM | POA: Diagnosis not present

## 2021-10-11 DIAGNOSIS — K5790 Diverticulosis of intestine, part unspecified, without perforation or abscess without bleeding: Secondary | ICD-10-CM | POA: Insufficient documentation

## 2021-10-11 DIAGNOSIS — F419 Anxiety disorder, unspecified: Secondary | ICD-10-CM | POA: Diagnosis present

## 2021-10-11 DIAGNOSIS — J431 Panlobular emphysema: Secondary | ICD-10-CM | POA: Diagnosis not present

## 2021-10-11 DIAGNOSIS — N189 Chronic kidney disease, unspecified: Secondary | ICD-10-CM | POA: Insufficient documentation

## 2021-10-11 DIAGNOSIS — B3731 Acute candidiasis of vulva and vagina: Secondary | ICD-10-CM | POA: Diagnosis not present

## 2021-10-11 DIAGNOSIS — N3000 Acute cystitis without hematuria: Secondary | ICD-10-CM | POA: Diagnosis not present

## 2021-10-11 DIAGNOSIS — F17210 Nicotine dependence, cigarettes, uncomplicated: Secondary | ICD-10-CM | POA: Diagnosis not present

## 2021-10-11 DIAGNOSIS — R4182 Altered mental status, unspecified: Secondary | ICD-10-CM | POA: Diagnosis not present

## 2021-10-11 DIAGNOSIS — A6004 Herpesviral vulvovaginitis: Secondary | ICD-10-CM | POA: Diagnosis not present

## 2021-10-11 DIAGNOSIS — K219 Gastro-esophageal reflux disease without esophagitis: Secondary | ICD-10-CM | POA: Insufficient documentation

## 2021-10-11 DIAGNOSIS — E782 Mixed hyperlipidemia: Secondary | ICD-10-CM | POA: Diagnosis present

## 2021-10-11 DIAGNOSIS — I10 Essential (primary) hypertension: Secondary | ICD-10-CM | POA: Insufficient documentation

## 2021-10-11 DIAGNOSIS — Z8673 Personal history of transient ischemic attack (TIA), and cerebral infarction without residual deficits: Secondary | ICD-10-CM | POA: Diagnosis not present

## 2021-10-11 DIAGNOSIS — F3170 Bipolar disorder, currently in remission, most recent episode unspecified: Secondary | ICD-10-CM | POA: Diagnosis not present

## 2021-10-12 DIAGNOSIS — Z8673 Personal history of transient ischemic attack (TIA), and cerebral infarction without residual deficits: Secondary | ICD-10-CM | POA: Diagnosis not present

## 2021-10-12 DIAGNOSIS — N3000 Acute cystitis without hematuria: Secondary | ICD-10-CM | POA: Diagnosis not present

## 2021-10-12 DIAGNOSIS — J431 Panlobular emphysema: Secondary | ICD-10-CM | POA: Diagnosis not present

## 2021-10-12 DIAGNOSIS — F3170 Bipolar disorder, currently in remission, most recent episode unspecified: Secondary | ICD-10-CM | POA: Diagnosis not present

## 2021-10-12 DIAGNOSIS — G9341 Metabolic encephalopathy: Secondary | ICD-10-CM | POA: Diagnosis not present

## 2021-10-12 DIAGNOSIS — F17210 Nicotine dependence, cigarettes, uncomplicated: Secondary | ICD-10-CM | POA: Diagnosis not present

## 2021-10-24 ENCOUNTER — Emergency Department (HOSPITAL_COMMUNITY): Payer: Medicare HMO

## 2021-10-24 ENCOUNTER — Other Ambulatory Visit: Payer: Self-pay

## 2021-10-24 ENCOUNTER — Encounter (HOSPITAL_COMMUNITY): Payer: Self-pay

## 2021-10-24 ENCOUNTER — Inpatient Hospital Stay (HOSPITAL_COMMUNITY)
Admission: EM | Admit: 2021-10-24 | Discharge: 2021-11-07 | DRG: 064 | Disposition: A | Discharge status: Skilled Nursing Facility | Payer: Medicare HMO | Attending: Internal Medicine | Admitting: Internal Medicine

## 2021-10-24 DIAGNOSIS — E782 Mixed hyperlipidemia: Secondary | ICD-10-CM | POA: Diagnosis present

## 2021-10-24 DIAGNOSIS — R627 Adult failure to thrive: Secondary | ICD-10-CM | POA: Diagnosis not present

## 2021-10-24 DIAGNOSIS — F0390 Unspecified dementia without behavioral disturbance: Secondary | ICD-10-CM | POA: Diagnosis not present

## 2021-10-24 DIAGNOSIS — R9431 Abnormal electrocardiogram [ECG] [EKG]: Secondary | ICD-10-CM | POA: Diagnosis not present

## 2021-10-24 DIAGNOSIS — R29704 NIHSS score 4: Secondary | ICD-10-CM | POA: Diagnosis present

## 2021-10-24 DIAGNOSIS — G929 Unspecified toxic encephalopathy: Secondary | ICD-10-CM | POA: Diagnosis present

## 2021-10-24 DIAGNOSIS — F0394 Unspecified dementia, unspecified severity, with anxiety: Secondary | ICD-10-CM | POA: Diagnosis present

## 2021-10-24 DIAGNOSIS — E876 Hypokalemia: Principal | ICD-10-CM

## 2021-10-24 DIAGNOSIS — Z95818 Presence of other cardiac implants and grafts: Secondary | ICD-10-CM

## 2021-10-24 DIAGNOSIS — F32A Depression, unspecified: Secondary | ICD-10-CM | POA: Diagnosis not present

## 2021-10-24 DIAGNOSIS — E538 Deficiency of other specified B group vitamins: Secondary | ICD-10-CM | POA: Diagnosis present

## 2021-10-24 DIAGNOSIS — R0902 Hypoxemia: Secondary | ICD-10-CM | POA: Diagnosis not present

## 2021-10-24 DIAGNOSIS — I639 Cerebral infarction, unspecified: Secondary | ICD-10-CM

## 2021-10-24 DIAGNOSIS — Z7951 Long term (current) use of inhaled steroids: Secondary | ICD-10-CM

## 2021-10-24 DIAGNOSIS — Z96611 Presence of right artificial shoulder joint: Secondary | ICD-10-CM | POA: Diagnosis present

## 2021-10-24 DIAGNOSIS — K219 Gastro-esophageal reflux disease without esophagitis: Secondary | ICD-10-CM | POA: Diagnosis present

## 2021-10-24 DIAGNOSIS — I6389 Other cerebral infarction: Secondary | ICD-10-CM | POA: Diagnosis not present

## 2021-10-24 DIAGNOSIS — D329 Benign neoplasm of meninges, unspecified: Secondary | ICD-10-CM | POA: Diagnosis present

## 2021-10-24 DIAGNOSIS — R404 Transient alteration of awareness: Secondary | ICD-10-CM | POA: Diagnosis not present

## 2021-10-24 DIAGNOSIS — F339 Major depressive disorder, recurrent, unspecified: Secondary | ICD-10-CM | POA: Diagnosis not present

## 2021-10-24 DIAGNOSIS — Z91048 Other nonmedicinal substance allergy status: Secondary | ICD-10-CM

## 2021-10-24 DIAGNOSIS — Z8249 Family history of ischemic heart disease and other diseases of the circulatory system: Secondary | ICD-10-CM

## 2021-10-24 DIAGNOSIS — G928 Other toxic encephalopathy: Secondary | ICD-10-CM | POA: Diagnosis present

## 2021-10-24 DIAGNOSIS — R410 Disorientation, unspecified: Secondary | ICD-10-CM | POA: Diagnosis not present

## 2021-10-24 DIAGNOSIS — G459 Transient cerebral ischemic attack, unspecified: Secondary | ICD-10-CM | POA: Diagnosis not present

## 2021-10-24 DIAGNOSIS — Z91040 Latex allergy status: Secondary | ICD-10-CM

## 2021-10-24 DIAGNOSIS — R6889 Other general symptoms and signs: Secondary | ICD-10-CM | POA: Diagnosis not present

## 2021-10-24 DIAGNOSIS — G9341 Metabolic encephalopathy: Secondary | ICD-10-CM | POA: Diagnosis present

## 2021-10-24 DIAGNOSIS — Z90710 Acquired absence of both cervix and uterus: Secondary | ICD-10-CM

## 2021-10-24 DIAGNOSIS — I1310 Hypertensive heart and chronic kidney disease without heart failure, with stage 1 through stage 4 chronic kidney disease, or unspecified chronic kidney disease: Secondary | ICD-10-CM | POA: Diagnosis present

## 2021-10-24 DIAGNOSIS — F17210 Nicotine dependence, cigarettes, uncomplicated: Secondary | ICD-10-CM | POA: Diagnosis present

## 2021-10-24 DIAGNOSIS — R4182 Altered mental status, unspecified: Secondary | ICD-10-CM | POA: Diagnosis not present

## 2021-10-24 DIAGNOSIS — Z885 Allergy status to narcotic agent status: Secondary | ICD-10-CM

## 2021-10-24 DIAGNOSIS — Z20822 Contact with and (suspected) exposure to covid-19: Secondary | ICD-10-CM | POA: Diagnosis present

## 2021-10-24 DIAGNOSIS — M6281 Muscle weakness (generalized): Secondary | ICD-10-CM | POA: Diagnosis not present

## 2021-10-24 DIAGNOSIS — Z79899 Other long term (current) drug therapy: Secondary | ICD-10-CM

## 2021-10-24 DIAGNOSIS — I69398 Other sequelae of cerebral infarction: Secondary | ICD-10-CM | POA: Diagnosis not present

## 2021-10-24 DIAGNOSIS — I340 Nonrheumatic mitral (valve) insufficiency: Secondary | ICD-10-CM | POA: Diagnosis not present

## 2021-10-24 DIAGNOSIS — Z9049 Acquired absence of other specified parts of digestive tract: Secondary | ICD-10-CM

## 2021-10-24 DIAGNOSIS — N182 Chronic kidney disease, stage 2 (mild): Secondary | ICD-10-CM | POA: Diagnosis present

## 2021-10-24 DIAGNOSIS — R531 Weakness: Secondary | ICD-10-CM

## 2021-10-24 DIAGNOSIS — F419 Anxiety disorder, unspecified: Secondary | ICD-10-CM | POA: Diagnosis present

## 2021-10-24 DIAGNOSIS — Z8673 Personal history of transient ischemic attack (TIA), and cerebral infarction without residual deficits: Secondary | ICD-10-CM

## 2021-10-24 DIAGNOSIS — I63419 Cerebral infarction due to embolism of unspecified middle cerebral artery: Secondary | ICD-10-CM | POA: Diagnosis present

## 2021-10-24 DIAGNOSIS — F3170 Bipolar disorder, currently in remission, most recent episode unspecified: Secondary | ICD-10-CM | POA: Diagnosis present

## 2021-10-24 DIAGNOSIS — R29898 Other symptoms and signs involving the musculoskeletal system: Secondary | ICD-10-CM | POA: Diagnosis not present

## 2021-10-24 DIAGNOSIS — J449 Chronic obstructive pulmonary disease, unspecified: Secondary | ICD-10-CM | POA: Diagnosis present

## 2021-10-24 DIAGNOSIS — G9340 Encephalopathy, unspecified: Secondary | ICD-10-CM | POA: Diagnosis not present

## 2021-10-24 DIAGNOSIS — E440 Moderate protein-calorie malnutrition: Secondary | ICD-10-CM | POA: Diagnosis present

## 2021-10-24 DIAGNOSIS — Q283 Other malformations of cerebral vessels: Secondary | ICD-10-CM | POA: Diagnosis not present

## 2021-10-24 DIAGNOSIS — I6340 Cerebral infarction due to embolism of unspecified cerebral artery: Secondary | ICD-10-CM | POA: Diagnosis not present

## 2021-10-24 DIAGNOSIS — Z743 Need for continuous supervision: Secondary | ICD-10-CM | POA: Diagnosis not present

## 2021-10-24 DIAGNOSIS — I700 Atherosclerosis of aorta: Secondary | ICD-10-CM | POA: Diagnosis present

## 2021-10-24 DIAGNOSIS — Z4509 Encounter for adjustment and management of other cardiac device: Secondary | ICD-10-CM

## 2021-10-24 DIAGNOSIS — I517 Cardiomegaly: Secondary | ICD-10-CM | POA: Diagnosis not present

## 2021-10-24 DIAGNOSIS — J439 Emphysema, unspecified: Secondary | ICD-10-CM | POA: Diagnosis present

## 2021-10-24 DIAGNOSIS — N189 Chronic kidney disease, unspecified: Secondary | ICD-10-CM | POA: Diagnosis not present

## 2021-10-24 DIAGNOSIS — Z6827 Body mass index (BMI) 27.0-27.9, adult: Secondary | ICD-10-CM

## 2021-10-24 DIAGNOSIS — I6623 Occlusion and stenosis of bilateral posterior cerebral arteries: Secondary | ICD-10-CM | POA: Diagnosis not present

## 2021-10-24 DIAGNOSIS — I472 Ventricular tachycardia, unspecified: Secondary | ICD-10-CM | POA: Diagnosis not present

## 2021-10-24 DIAGNOSIS — R197 Diarrhea, unspecified: Secondary | ICD-10-CM | POA: Diagnosis present

## 2021-10-24 DIAGNOSIS — F918 Other conduct disorders: Secondary | ICD-10-CM | POA: Diagnosis not present

## 2021-10-24 DIAGNOSIS — R2689 Other abnormalities of gait and mobility: Secondary | ICD-10-CM | POA: Diagnosis not present

## 2021-10-24 DIAGNOSIS — I10 Essential (primary) hypertension: Secondary | ICD-10-CM | POA: Diagnosis not present

## 2021-10-24 DIAGNOSIS — I63233 Cerebral infarction due to unspecified occlusion or stenosis of bilateral carotid arteries: Secondary | ICD-10-CM | POA: Diagnosis not present

## 2021-10-24 DIAGNOSIS — Z808 Family history of malignant neoplasm of other organs or systems: Secondary | ICD-10-CM

## 2021-10-24 DIAGNOSIS — I69811 Memory deficit following other cerebrovascular disease: Secondary | ICD-10-CM | POA: Diagnosis not present

## 2021-10-24 DIAGNOSIS — I672 Cerebral atherosclerosis: Secondary | ICD-10-CM | POA: Diagnosis not present

## 2021-10-24 DIAGNOSIS — I6503 Occlusion and stenosis of bilateral vertebral arteries: Secondary | ICD-10-CM | POA: Diagnosis not present

## 2021-10-24 LAB — PROTIME-INR
INR: 1 (ref 0.8–1.2)
Prothrombin Time: 12.9 seconds (ref 11.4–15.2)

## 2021-10-24 LAB — URINALYSIS, ROUTINE W REFLEX MICROSCOPIC
Bilirubin Urine: NEGATIVE
Glucose, UA: NEGATIVE mg/dL
Ketones, ur: NEGATIVE mg/dL
Nitrite: NEGATIVE
Protein, ur: NEGATIVE mg/dL
Specific Gravity, Urine: 1.01 (ref 1.005–1.030)
pH: 5 (ref 5.0–8.0)

## 2021-10-24 LAB — RESP PANEL BY RT-PCR (FLU A&B, COVID) ARPGX2
Influenza A by PCR: NEGATIVE
Influenza B by PCR: NEGATIVE
SARS Coronavirus 2 by RT PCR: NEGATIVE

## 2021-10-24 LAB — CBC
HCT: 40.6 % (ref 36.0–46.0)
Hemoglobin: 14.2 g/dL (ref 12.0–15.0)
MCH: 29.6 pg (ref 26.0–34.0)
MCHC: 35 g/dL (ref 30.0–36.0)
MCV: 84.8 fL (ref 80.0–100.0)
Platelets: 304 10*3/uL (ref 150–400)
RBC: 4.79 MIL/uL (ref 3.87–5.11)
RDW: 14.3 % (ref 11.5–15.5)
WBC: 12.5 10*3/uL — ABNORMAL HIGH (ref 4.0–10.5)
nRBC: 0 % (ref 0.0–0.2)

## 2021-10-24 LAB — DIFFERENTIAL
Abs Immature Granulocytes: 0.06 10*3/uL (ref 0.00–0.07)
Basophils Absolute: 0.1 10*3/uL (ref 0.0–0.1)
Basophils Relative: 0 %
Eosinophils Absolute: 0.1 10*3/uL (ref 0.0–0.5)
Eosinophils Relative: 1 %
Immature Granulocytes: 1 %
Lymphocytes Relative: 30 %
Lymphs Abs: 3.8 10*3/uL (ref 0.7–4.0)
Monocytes Absolute: 1 10*3/uL (ref 0.1–1.0)
Monocytes Relative: 8 %
Neutro Abs: 7.7 10*3/uL (ref 1.7–7.7)
Neutrophils Relative %: 60 %

## 2021-10-24 LAB — COMPREHENSIVE METABOLIC PANEL
ALT: 27 U/L (ref 0–44)
AST: 30 U/L (ref 15–41)
Albumin: 3.5 g/dL (ref 3.5–5.0)
Alkaline Phosphatase: 107 U/L (ref 38–126)
Anion gap: 12 (ref 5–15)
BUN: 8 mg/dL (ref 8–23)
CO2: 24 mmol/L (ref 22–32)
Calcium: 9.2 mg/dL (ref 8.9–10.3)
Chloride: 98 mmol/L (ref 98–111)
Creatinine, Ser: 0.89 mg/dL (ref 0.44–1.00)
GFR, Estimated: >60 mL/min (ref >60)
Glucose, Bld: 104 mg/dL — ABNORMAL HIGH (ref 70–99)
Potassium: 2.5 mmol/L — CL (ref 3.5–5.1)
Sodium: 134 mmol/L — ABNORMAL LOW (ref 135–145)
Total Bilirubin: 0.7 mg/dL (ref 0.3–1.2)
Total Protein: 6.7 g/dL (ref 6.5–8.1)

## 2021-10-24 LAB — I-STAT CHEM 8, ED
BUN: 8 mg/dL (ref 8–23)
Calcium, Ion: 1.05 mmol/L — ABNORMAL LOW (ref 1.15–1.40)
Chloride: 98 mmol/L (ref 98–111)
Creatinine, Ser: 0.8 mg/dL (ref 0.44–1.00)
Glucose, Bld: 107 mg/dL — ABNORMAL HIGH (ref 70–99)
HCT: 41 % (ref 36.0–46.0)
Hemoglobin: 13.9 g/dL (ref 12.0–15.0)
Potassium: 2.4 mmol/L — CL (ref 3.5–5.1)
Sodium: 133 mmol/L — ABNORMAL LOW (ref 135–145)
TCO2: 24 mmol/L (ref 22–32)

## 2021-10-24 LAB — APTT: aPTT: 24 seconds (ref 24–36)

## 2021-10-24 LAB — MAGNESIUM: Magnesium: 1.8 mg/dL (ref 1.7–2.4)

## 2021-10-24 LAB — POTASSIUM: Potassium: 2.5 mmol/L — CL (ref 3.5–5.1)

## 2021-10-24 LAB — AMMONIA: Ammonia: 16 umol/L (ref 9–35)

## 2021-10-24 IMAGING — DX DG CHEST 1V PORT
1 series · 1 of 1 positions shown · non-contrast
Comparison: [DATE]

CLINICAL DATA: Altered mental status

EXAM:
PORTABLE CHEST 1 VIEW

[chest]
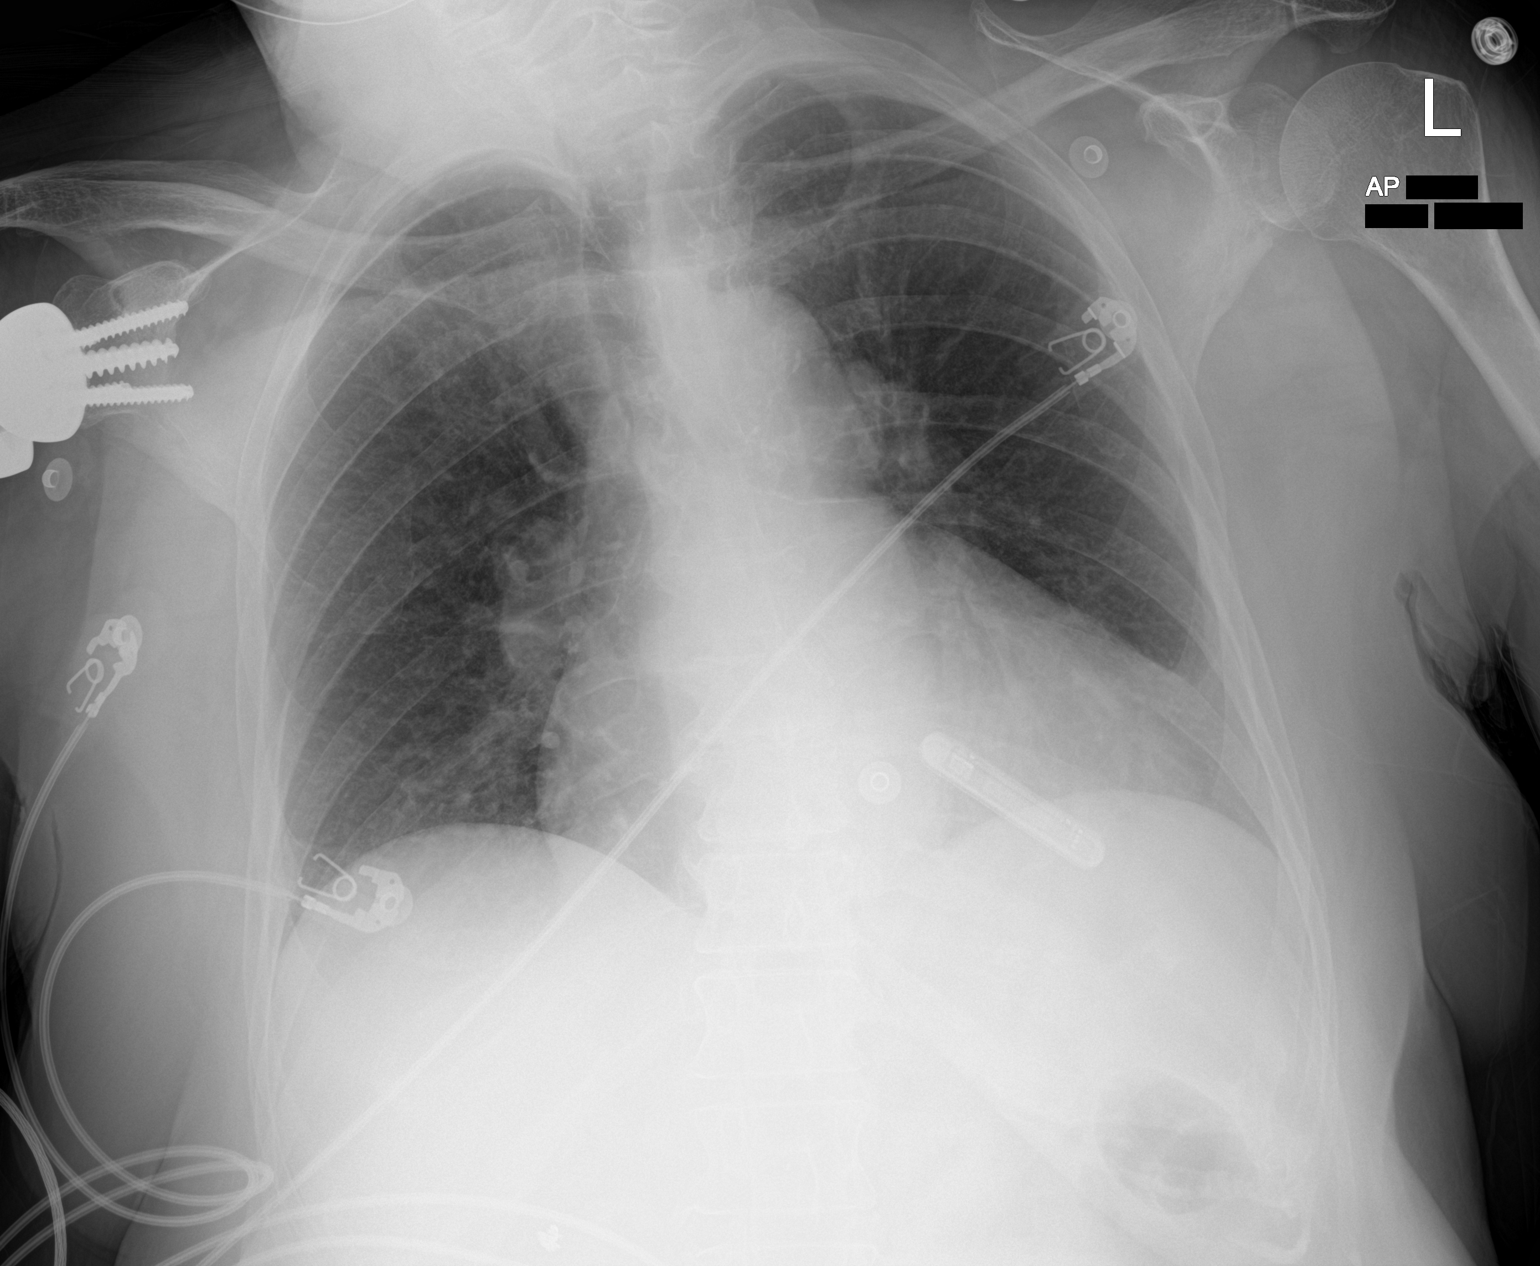

[1 of 1 positions shown; findings below may reference images not displayed]

FINDINGS: Recording device over left chest. Cardiomegaly. No focal opacity,
pleural effusion or pneumothorax. Partially visualized right
shoulder replacement
IMPRESSION: No active disease.  Cardiomegaly

## 2021-10-24 IMAGING — CT CT HEAD W/O CM
4 series · 17 of 47 positions shown, 19 images · non-contrast
Comparison: MRI [DATE], CT [DATE]

CLINICAL DATA: Mental status change

EXAM:
CT HEAD WITHOUT CONTRAST
TECHNIQUE: Contiguous axial images were obtained from the base of the skull
through the vertex without intravenous contrast.

[Series 3: head wo · axial · 0.40mm/px · z∈[-141,-21]mm · 7 of 34 slices shown, 9 images]
[im 5/34  brain]
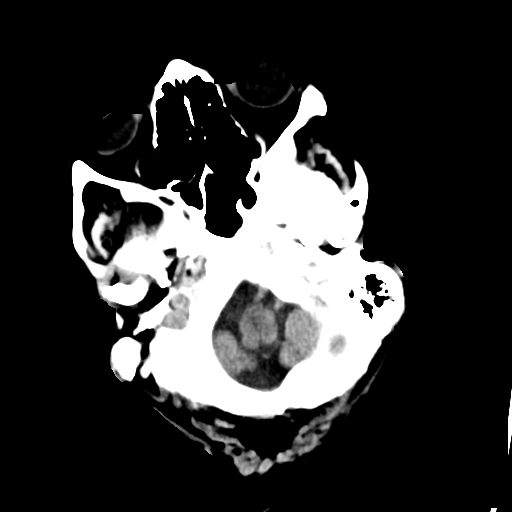
[im 5/34  bone]
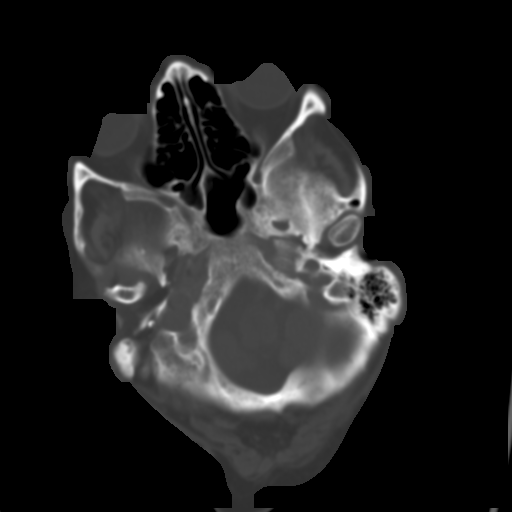
[im 9/34  brain]
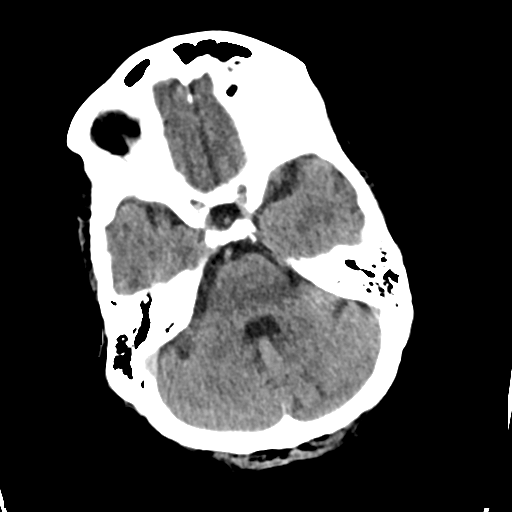
[im 13/34  brain]
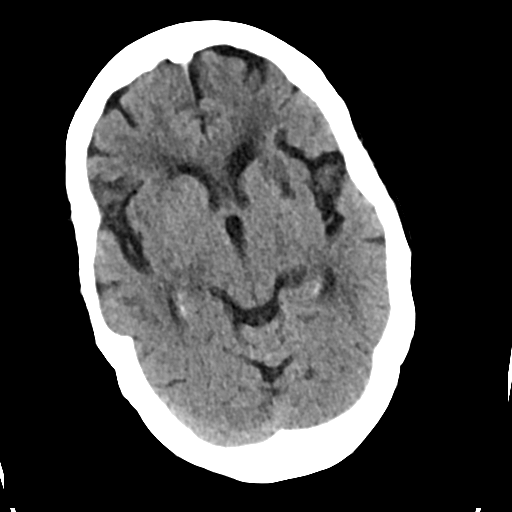
[im 17/34  brain]
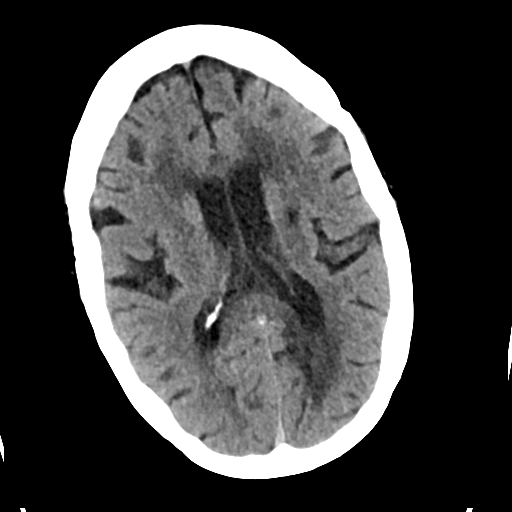
[im 21/34  brain]
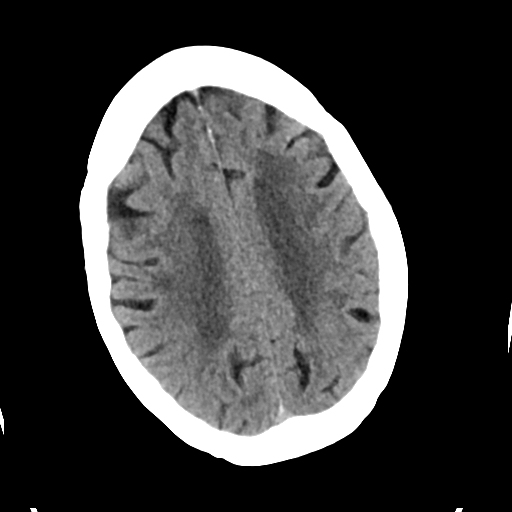
[im 21/34  bone]
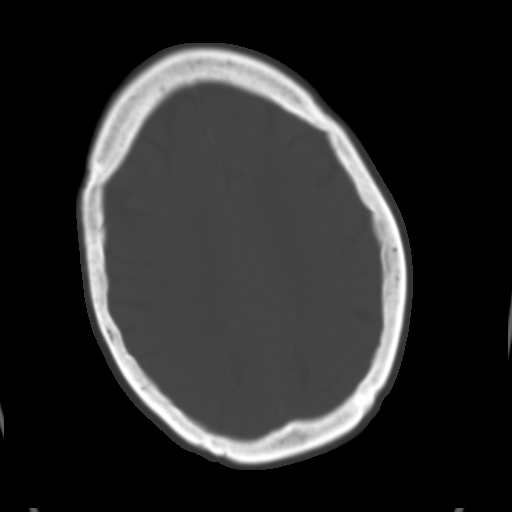
[im 25/34  brain]
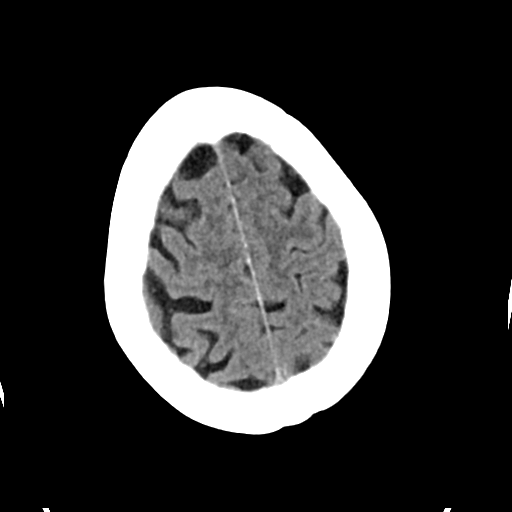
[im 29/34  brain]
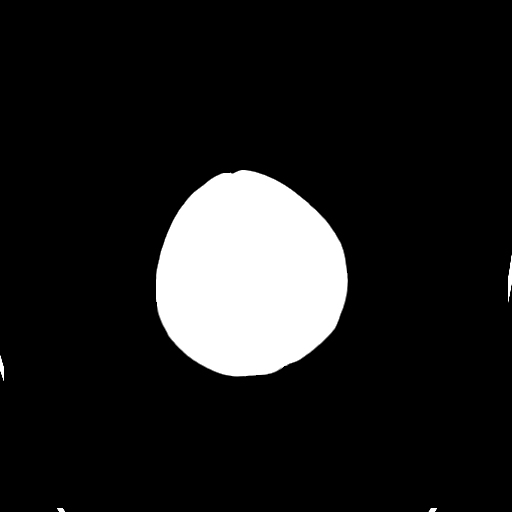

[Series 4: head bone · axial · 0.40mm/px · z∈[-145,-89]mm · 4 of 83 slices shown]
[im 9/83  bone]
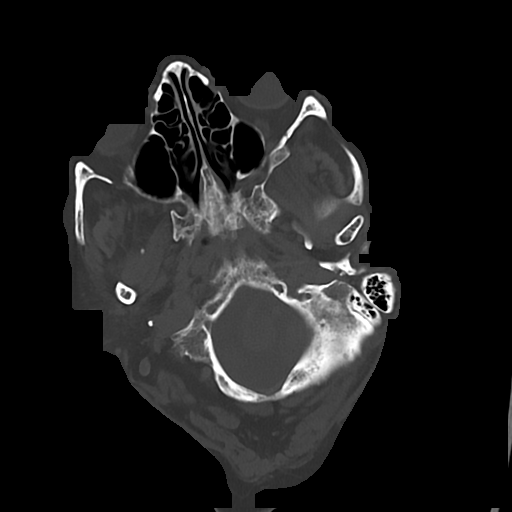
[im 17/83  bone]
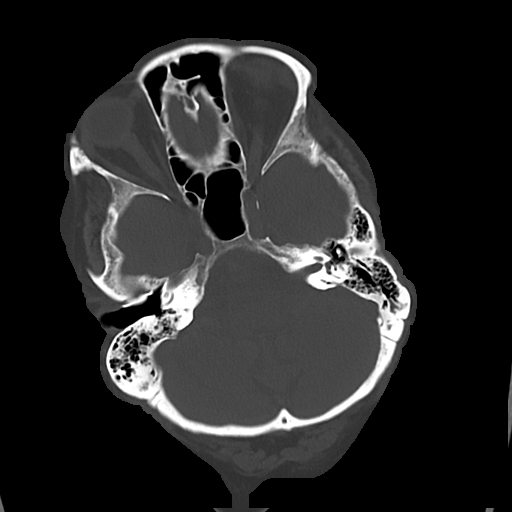
[im 25/83  bone]
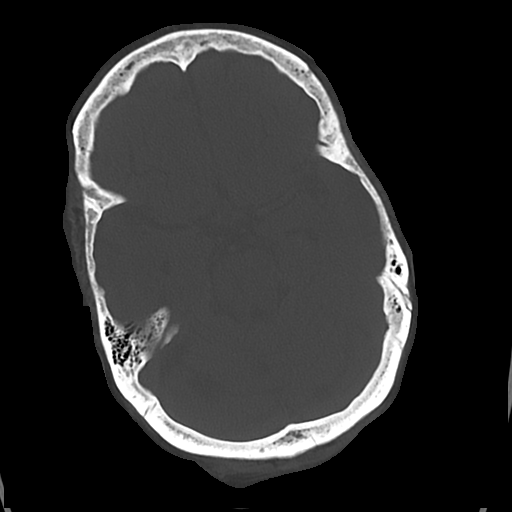
[im 37/83  bone]
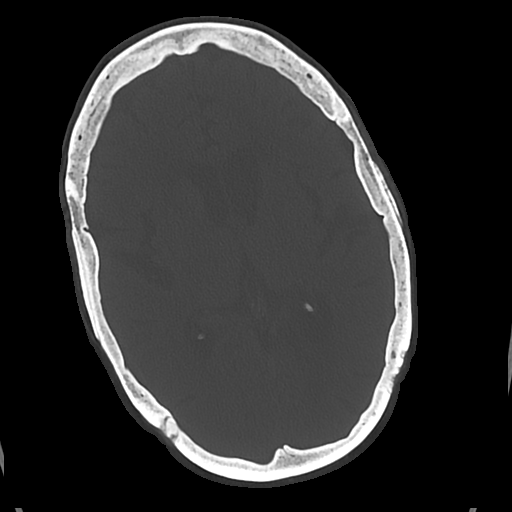

[Series 5: cor soft · coronal · 0.29mm/px · 3 of 66 slices shown]
[im 22/66  brain]
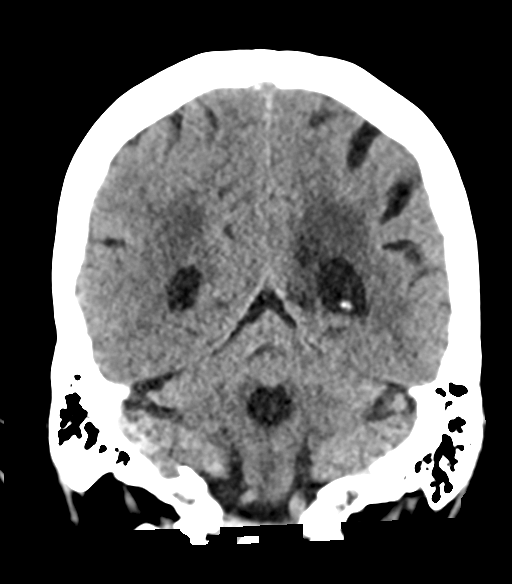
[im 29/66  brain]
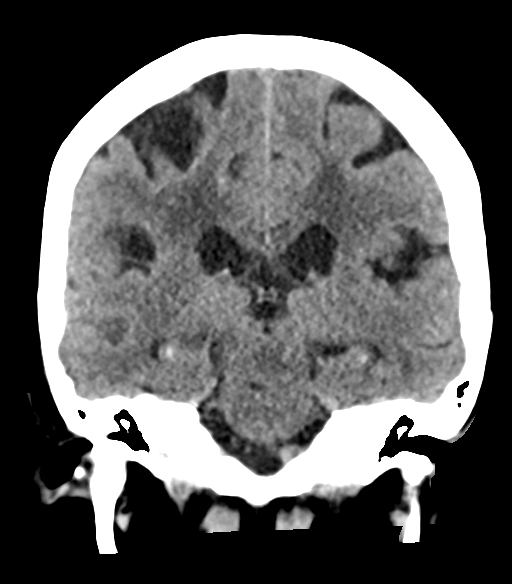
[im 37/66  brain]
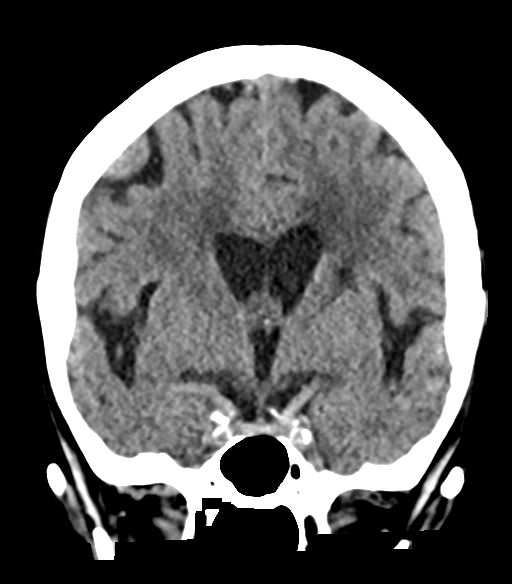

[Series 6: sag soft · sagittal · 0.33mm/px · 3 of 51 slices shown]
[im 17/51  brain]
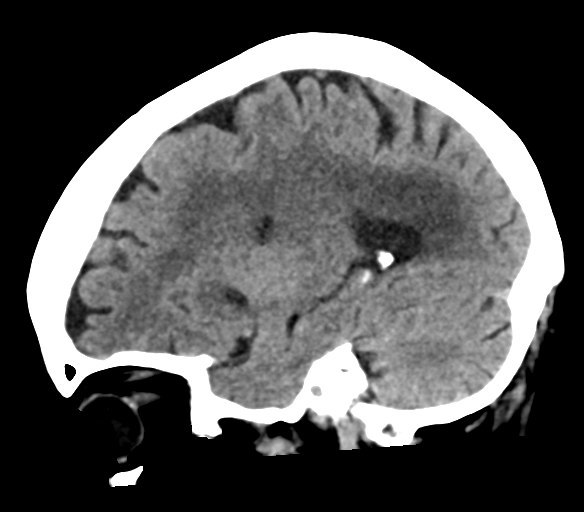
[im 26/51  brain]
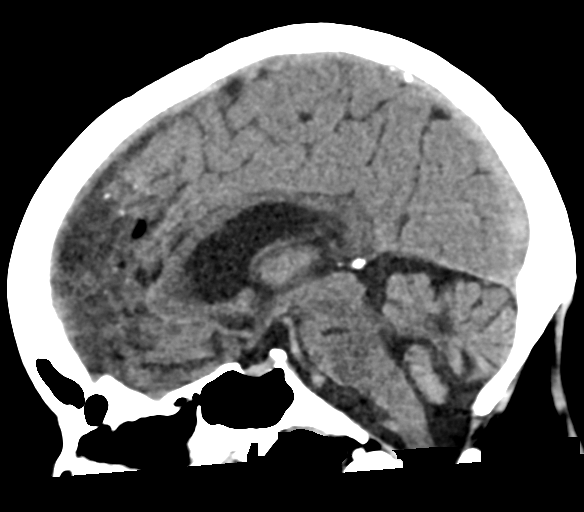
[im 34/51  brain]
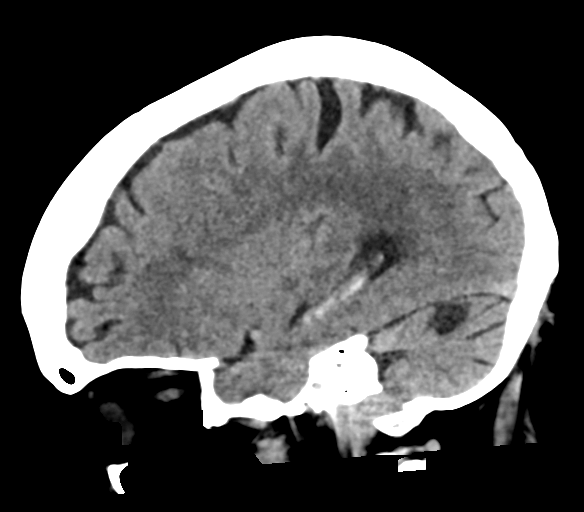

[17 of 47 positions shown; findings below may reference images not displayed]

FINDINGS: Brain: No acute territorial infarction, hemorrhage or new
intracranial mass. Left parietal convexity meningioma on MRI is
better seen on MRI. Multiple chronic infarcts involving the right
greater than left cerebellum, left basal ganglia, left white matter,
and left posteromedial temporal lobe and medial left occipital lobe.
Atrophy and chronic small vessel ischemic changes of the white
matter. Stable ventricle size.

Vascular: No hyperdense vessels.  No unexpected calcification

Skull: Normal. Negative for fracture or focal lesion.

Sinuses/Orbits: No acute finding.

Other: None
IMPRESSION: 1. No definite CT evidence for acute intracranial abnormality.
2. Atrophy and chronic small vessel ischemic changes of the white
matter. Multiple chronic infarcts as described above.

## 2021-10-24 MED ORDER — MOMETASONE FURO-FORMOTEROL FUM 200-5 MCG/ACT IN AERO
2.0000 | INHALATION_SPRAY | Freq: Two times a day (BID) | RESPIRATORY_TRACT | Status: DC
  Administered 2021-10-25 – 2021-11-06 (×23): 2 via RESPIRATORY_TRACT
  Filled 2021-10-24: qty 8.8

## 2021-10-24 MED ORDER — ACETAMINOPHEN 650 MG RE SUPP: 650.0000 mg | Freq: Four times a day (QID) | RECTAL | Status: DC | PRN

## 2021-10-24 MED ORDER — POTASSIUM CHLORIDE 10 MEQ/100ML IV SOLN
10.0000 meq | INTRAVENOUS | Status: AC
  Administered 2021-10-24 – 2021-10-25 (×4): 10 meq via INTRAVENOUS
  Filled 2021-10-24 (×3): qty 100

## 2021-10-24 MED ORDER — ASPIRIN EC 81 MG PO TBEC
81.0000 mg | DELAYED_RELEASE_TABLET | Freq: Every day | ORAL | Status: DC
  Administered 2021-10-25 – 2021-11-07 (×14): 81 mg via ORAL
  Filled 2021-10-24 (×14): qty 1

## 2021-10-24 MED ORDER — POTASSIUM CHLORIDE CRYS ER 20 MEQ PO TBCR
40.0000 meq | EXTENDED_RELEASE_TABLET | Freq: Once | ORAL | Status: DC
  Filled 2021-10-24: qty 2

## 2021-10-24 MED ORDER — SODIUM CHLORIDE 0.9 % IV BOLUS
500.0000 mL | Freq: Once | INTRAVENOUS | Status: AC
  Administered 2021-10-24: 500 mL via INTRAVENOUS

## 2021-10-24 MED ORDER — ACETAMINOPHEN 325 MG PO TABS
650.0000 mg | ORAL_TABLET | Freq: Four times a day (QID) | ORAL | Status: DC | PRN
  Administered 2021-10-27 – 2021-10-29 (×2): 650 mg via ORAL
  Filled 2021-10-24 (×2): qty 2

## 2021-10-24 MED ORDER — ATORVASTATIN CALCIUM 40 MG PO TABS
40.0000 mg | ORAL_TABLET | Freq: Every day | ORAL | Status: DC
  Administered 2021-10-25 – 2021-10-31 (×8): 40 mg via ORAL
  Filled 2021-10-24 (×8): qty 1

## 2021-10-24 MED ORDER — POTASSIUM CHLORIDE 10 MEQ/100ML IV SOLN
10.0000 meq | Freq: Once | INTRAVENOUS | Status: AC
  Administered 2021-10-24: 10 meq via INTRAVENOUS
  Filled 2021-10-24: qty 100

## 2021-10-24 MED ORDER — PANTOPRAZOLE SODIUM 40 MG PO TBEC
40.0000 mg | DELAYED_RELEASE_TABLET | Freq: Every day | ORAL | Status: DC
  Administered 2021-10-25 – 2021-11-07 (×14): 40 mg via ORAL
  Filled 2021-10-24 (×14): qty 1

## 2021-10-24 MED ORDER — SODIUM CHLORIDE 0.9% FLUSH
3.0000 mL | Freq: Once | INTRAVENOUS | Status: DC
Start: 2021-10-24 — End: 2021-11-07

## 2021-10-24 MED ORDER — ALBUTEROL SULFATE (2.5 MG/3ML) 0.083% IN NEBU: 3.0000 mL | INHALATION_SOLUTION | Freq: Four times a day (QID) | RESPIRATORY_TRACT | Status: DC | PRN

## 2021-10-24 MED ORDER — MELATONIN 5 MG PO TABS
10.0000 mg | ORAL_TABLET | Freq: Every evening | ORAL | Status: DC | PRN
  Administered 2021-10-25 (×2): 10 mg via ORAL
  Filled 2021-10-24 (×2): qty 2

## 2021-10-24 MED ORDER — ENOXAPARIN SODIUM 40 MG/0.4ML IJ SOSY
40.0000 mg | PREFILLED_SYRINGE | INTRAMUSCULAR | Status: DC
  Filled 2021-10-24: qty 0.4

## 2021-10-24 MED ORDER — SODIUM CHLORIDE 0.9 % IV SOLN: INTRAVENOUS | Status: DC

## 2021-10-24 MED ORDER — SODIUM CHLORIDE 0.9 % IV SOLN
1.0000 g | INTRAVENOUS | Status: DC
  Administered 2021-10-25: 1 g via INTRAVENOUS
  Filled 2021-10-24: qty 10
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
## 2021-10-24 NOTE — ED Notes (Signed)
While pt is oriented other than the year she sometimes gets confused or repeats questions.

## 2021-10-24 NOTE — ED Provider Notes (Signed)
Emergency Medicine Provider Triage Evaluation Note  Sandra Robinson , a 70 y.o. female  was evaluated in triage.  Pt arrived to the emergency department for altered mental status.  Per nursing note, the son talks to the patient every single day and notes he is more altered today than usual.  Unknown last known normal.  Was recently treated for urinary tract infection per the son.  Review of Systems  Positive:  Negative: See above   Physical Exam  BP (!) 172/96 (BP Location: Left Arm)   Pulse 81   Temp 97.6 F (36.4 C) (Oral)   Resp 16   SpO2 95%  Gen:   Awake, no distress, confused   Resp:  Normal effort MSK:   Moves extremities without difficulty  Other:  Cranial nerves II through XII are intact.  There is clear 3/5 weakness in the left upper extremity.  She is alert and oriented to place only.  4/5 strength in the left lower extremity.  Slow to perform rapid altering movements.  Slow finger-to-nose.  Medical Decision Making  Medically screening exam initiated at 2:42 PM.  Appropriate orders placed.  [place] was informed that the remainder of the evaluation will be completed by another provider, this initial triage assessment does not replace that evaluation, and the importance of remaining in the ED until their evaluation is complete.     Myna Bright [place], PA-C 10/24/21 1445    Daleen Bo, MD 10/24/21 2047

## 2021-10-24 NOTE — ED Triage Notes (Signed)
Patient arrived by [facility] from home for AMS. Patient has hx of recent stroke and patient seemed more altered then normal when they spoke on the phone. Questionable UTI per son. Patient reports that she recently took medication for an infection

## 2021-10-25 DIAGNOSIS — J449 Chronic obstructive pulmonary disease, unspecified: Secondary | ICD-10-CM | POA: Diagnosis not present

## 2021-10-25 DIAGNOSIS — F3170 Bipolar disorder, currently in remission, most recent episode unspecified: Secondary | ICD-10-CM | POA: Diagnosis present

## 2021-10-25 DIAGNOSIS — G9341 Metabolic encephalopathy: Secondary | ICD-10-CM

## 2021-10-25 DIAGNOSIS — F0394 Unspecified dementia, unspecified severity, with anxiety: Secondary | ICD-10-CM | POA: Diagnosis present

## 2021-10-25 DIAGNOSIS — G929 Unspecified toxic encephalopathy: Secondary | ICD-10-CM | POA: Diagnosis not present

## 2021-10-25 DIAGNOSIS — Z90710 Acquired absence of both cervix and uterus: Secondary | ICD-10-CM | POA: Diagnosis not present

## 2021-10-25 DIAGNOSIS — I6623 Occlusion and stenosis of bilateral posterior cerebral arteries: Secondary | ICD-10-CM | POA: Diagnosis not present

## 2021-10-25 DIAGNOSIS — D329 Benign neoplasm of meninges, unspecified: Secondary | ICD-10-CM | POA: Diagnosis present

## 2021-10-25 DIAGNOSIS — E876 Hypokalemia: Secondary | ICD-10-CM | POA: Diagnosis present

## 2021-10-25 DIAGNOSIS — Z95818 Presence of other cardiac implants and grafts: Secondary | ICD-10-CM | POA: Diagnosis not present

## 2021-10-25 DIAGNOSIS — E440 Moderate protein-calorie malnutrition: Secondary | ICD-10-CM | POA: Diagnosis not present

## 2021-10-25 DIAGNOSIS — I6389 Other cerebral infarction: Secondary | ICD-10-CM | POA: Diagnosis not present

## 2021-10-25 DIAGNOSIS — J439 Emphysema, unspecified: Secondary | ICD-10-CM | POA: Diagnosis present

## 2021-10-25 DIAGNOSIS — K219 Gastro-esophageal reflux disease without esophagitis: Secondary | ICD-10-CM | POA: Diagnosis present

## 2021-10-25 DIAGNOSIS — G459 Transient cerebral ischemic attack, unspecified: Secondary | ICD-10-CM | POA: Diagnosis not present

## 2021-10-25 DIAGNOSIS — F32A Depression, unspecified: Secondary | ICD-10-CM | POA: Diagnosis not present

## 2021-10-25 DIAGNOSIS — I472 Ventricular tachycardia, unspecified: Secondary | ICD-10-CM | POA: Diagnosis not present

## 2021-10-25 DIAGNOSIS — I6340 Cerebral infarction due to embolism of unspecified cerebral artery: Secondary | ICD-10-CM | POA: Diagnosis not present

## 2021-10-25 DIAGNOSIS — Z20822 Contact with and (suspected) exposure to covid-19: Secondary | ICD-10-CM | POA: Diagnosis present

## 2021-10-25 DIAGNOSIS — F17210 Nicotine dependence, cigarettes, uncomplicated: Secondary | ICD-10-CM | POA: Diagnosis present

## 2021-10-25 DIAGNOSIS — Z743 Need for continuous supervision: Secondary | ICD-10-CM | POA: Diagnosis not present

## 2021-10-25 DIAGNOSIS — N189 Chronic kidney disease, unspecified: Secondary | ICD-10-CM | POA: Diagnosis not present

## 2021-10-25 DIAGNOSIS — I1310 Hypertensive heart and chronic kidney disease without heart failure, with stage 1 through stage 4 chronic kidney disease, or unspecified chronic kidney disease: Secondary | ICD-10-CM | POA: Diagnosis not present

## 2021-10-25 DIAGNOSIS — I700 Atherosclerosis of aorta: Secondary | ICD-10-CM | POA: Diagnosis present

## 2021-10-25 DIAGNOSIS — R404 Transient alteration of awareness: Secondary | ICD-10-CM | POA: Diagnosis not present

## 2021-10-25 DIAGNOSIS — R627 Adult failure to thrive: Secondary | ICD-10-CM | POA: Diagnosis not present

## 2021-10-25 DIAGNOSIS — F0390 Unspecified dementia without behavioral disturbance: Secondary | ICD-10-CM | POA: Diagnosis not present

## 2021-10-25 DIAGNOSIS — R4182 Altered mental status, unspecified: Secondary | ICD-10-CM | POA: Diagnosis not present

## 2021-10-25 DIAGNOSIS — I639 Cerebral infarction, unspecified: Secondary | ICD-10-CM | POA: Diagnosis not present

## 2021-10-25 DIAGNOSIS — F918 Other conduct disorders: Secondary | ICD-10-CM | POA: Diagnosis not present

## 2021-10-25 DIAGNOSIS — F419 Anxiety disorder, unspecified: Secondary | ICD-10-CM | POA: Diagnosis not present

## 2021-10-25 DIAGNOSIS — R197 Diarrhea, unspecified: Secondary | ICD-10-CM | POA: Diagnosis present

## 2021-10-25 DIAGNOSIS — Q283 Other malformations of cerebral vessels: Secondary | ICD-10-CM | POA: Diagnosis not present

## 2021-10-25 DIAGNOSIS — R531 Weakness: Secondary | ICD-10-CM | POA: Diagnosis not present

## 2021-10-25 DIAGNOSIS — E782 Mixed hyperlipidemia: Secondary | ICD-10-CM | POA: Diagnosis not present

## 2021-10-25 DIAGNOSIS — I6503 Occlusion and stenosis of bilateral vertebral arteries: Secondary | ICD-10-CM | POA: Diagnosis not present

## 2021-10-25 DIAGNOSIS — I63233 Cerebral infarction due to unspecified occlusion or stenosis of bilateral carotid arteries: Secondary | ICD-10-CM | POA: Diagnosis not present

## 2021-10-25 DIAGNOSIS — I340 Nonrheumatic mitral (valve) insufficiency: Secondary | ICD-10-CM | POA: Diagnosis not present

## 2021-10-25 DIAGNOSIS — E538 Deficiency of other specified B group vitamins: Secondary | ICD-10-CM | POA: Diagnosis present

## 2021-10-25 DIAGNOSIS — I672 Cerebral atherosclerosis: Secondary | ICD-10-CM | POA: Diagnosis not present

## 2021-10-25 DIAGNOSIS — R29898 Other symptoms and signs involving the musculoskeletal system: Secondary | ICD-10-CM | POA: Diagnosis not present

## 2021-10-25 DIAGNOSIS — G928 Other toxic encephalopathy: Secondary | ICD-10-CM | POA: Diagnosis not present

## 2021-10-25 DIAGNOSIS — G9340 Encephalopathy, unspecified: Secondary | ICD-10-CM | POA: Diagnosis not present

## 2021-10-25 DIAGNOSIS — Z9049 Acquired absence of other specified parts of digestive tract: Secondary | ICD-10-CM | POA: Diagnosis not present

## 2021-10-25 DIAGNOSIS — R29704 NIHSS score 4: Secondary | ICD-10-CM | POA: Diagnosis present

## 2021-10-25 DIAGNOSIS — I63419 Cerebral infarction due to embolism of unspecified middle cerebral artery: Secondary | ICD-10-CM | POA: Diagnosis not present

## 2021-10-25 DIAGNOSIS — Z96611 Presence of right artificial shoulder joint: Secondary | ICD-10-CM | POA: Diagnosis present

## 2021-10-25 DIAGNOSIS — N182 Chronic kidney disease, stage 2 (mild): Secondary | ICD-10-CM | POA: Diagnosis present

## 2021-10-25 DIAGNOSIS — Z8249 Family history of ischemic heart disease and other diseases of the circulatory system: Secondary | ICD-10-CM | POA: Diagnosis not present

## 2021-10-25 DIAGNOSIS — Z8673 Personal history of transient ischemic attack (TIA), and cerebral infarction without residual deficits: Secondary | ICD-10-CM | POA: Diagnosis not present

## 2021-10-25 LAB — COMPREHENSIVE METABOLIC PANEL
ALT: 28 U/L (ref 0–44)
AST: 37 U/L (ref 15–41)
Albumin: 3.1 g/dL — ABNORMAL LOW (ref 3.5–5.0)
Alkaline Phosphatase: 103 U/L (ref 38–126)
Anion gap: 10 (ref 5–15)
BUN: 9 mg/dL (ref 8–23)
CO2: 24 mmol/L (ref 22–32)
Calcium: 8.8 mg/dL — ABNORMAL LOW (ref 8.9–10.3)
Chloride: 98 mmol/L (ref 98–111)
Creatinine, Ser: 0.97 mg/dL (ref 0.44–1.00)
GFR, Estimated: >60 mL/min (ref >60)
Glucose, Bld: 134 mg/dL — ABNORMAL HIGH (ref 70–99)
Potassium: 2.7 mmol/L — CL (ref 3.5–5.1)
Sodium: 132 mmol/L — ABNORMAL LOW (ref 135–145)
Total Bilirubin: 0.8 mg/dL (ref 0.3–1.2)
Total Protein: 6.1 g/dL — ABNORMAL LOW (ref 6.5–8.1)

## 2021-10-25 LAB — BASIC METABOLIC PANEL
Anion gap: 9 (ref 5–15)
BUN: 9 mg/dL (ref 8–23)
CO2: 24 mmol/L (ref 22–32)
Calcium: 8.7 mg/dL — ABNORMAL LOW (ref 8.9–10.3)
Chloride: 102 mmol/L (ref 98–111)
Creatinine, Ser: 1.03 mg/dL — ABNORMAL HIGH (ref 0.44–1.00)
GFR, Estimated: 59 mL/min — ABNORMAL LOW (ref >60)
Glucose, Bld: 132 mg/dL — ABNORMAL HIGH (ref 70–99)
Potassium: 3.3 mmol/L — ABNORMAL LOW (ref 3.5–5.1)
Sodium: 135 mmol/L (ref 135–145)

## 2021-10-25 LAB — URINE CULTURE

## 2021-10-25 LAB — HIV ANTIBODY (ROUTINE TESTING W REFLEX): HIV Screen 4th Generation wRfx: NONREACTIVE

## 2021-10-25 LAB — CBC
HCT: 41 % (ref 36.0–46.0)
Hemoglobin: 13.8 g/dL (ref 12.0–15.0)
MCH: 29.6 pg (ref 26.0–34.0)
MCHC: 33.7 g/dL (ref 30.0–36.0)
MCV: 88 fL (ref 80.0–100.0)
Platelets: 266 10*3/uL (ref 150–400)
RBC: 4.66 MIL/uL (ref 3.87–5.11)
RDW: 14.4 % (ref 11.5–15.5)
WBC: 10.8 10*3/uL — ABNORMAL HIGH (ref 4.0–10.5)
nRBC: 0 % (ref 0.0–0.2)

## 2021-10-25 MED ORDER — AMLODIPINE BESYLATE 5 MG PO TABS
5.0000 mg | ORAL_TABLET | Freq: Every day | ORAL | Status: DC
  Administered 2021-10-25 – 2021-10-26 (×2): 5 mg via ORAL
  Filled 2021-10-25 (×2): qty 1

## 2021-10-25 MED ORDER — TRAZODONE HCL 50 MG PO TABS
50.0000 mg | ORAL_TABLET | Freq: Every day | ORAL | Status: DC
  Administered 2021-10-25 – 2021-11-06 (×13): 50 mg via ORAL
  Filled 2021-10-25 (×13): qty 1

## 2021-10-25 MED ORDER — POTASSIUM CHLORIDE CRYS ER 20 MEQ PO TBCR
40.0000 meq | EXTENDED_RELEASE_TABLET | Freq: Once | ORAL | Status: AC
  Administered 2021-10-25: 40 meq via ORAL
  Filled 2021-10-25: qty 2

## 2021-10-25 MED ORDER — TOPIRAMATE 25 MG PO TABS
50.0000 mg | ORAL_TABLET | Freq: Every day | ORAL | Status: DC
  Administered 2021-10-25 – 2021-10-30 (×6): 50 mg via ORAL
  Filled 2021-10-25 (×6): qty 2

## 2021-10-25 MED ORDER — TAB-A-VITE/IRON PO TABS
1.0000 | ORAL_TABLET | Freq: Every day | ORAL | Status: DC
  Administered 2021-10-25 – 2021-11-07 (×13): 1 via ORAL
  Filled 2021-10-25 (×14): qty 1

## 2021-10-25 MED ORDER — THIAMINE HCL 100 MG PO TABS
250.0000 mg | ORAL_TABLET | Freq: Two times a day (BID) | ORAL | Status: AC
  Administered 2021-10-25 – 2021-10-30 (×10): 250 mg via ORAL
  Filled 2021-10-25 (×11): qty 3

## 2021-10-25 MED ORDER — ENOXAPARIN SODIUM 40 MG/0.4ML IJ SOSY
40.0000 mg | PREFILLED_SYRINGE | INTRAMUSCULAR | Status: DC
  Administered 2021-10-25 – 2021-11-06 (×13): 40 mg via SUBCUTANEOUS
  Filled 2021-10-25 (×13): qty 0.4

## 2021-10-25 MED ORDER — SERTRALINE HCL 100 MG PO TABS
100.0000 mg | ORAL_TABLET | Freq: Every morning | ORAL | Status: DC
  Administered 2021-10-26 – 2021-11-07 (×13): 100 mg via ORAL
  Filled 2021-10-25 (×13): qty 1

## 2021-10-25 MED ORDER — POTASSIUM CHLORIDE CRYS ER 20 MEQ PO TBCR: 40.0000 meq | EXTENDED_RELEASE_TABLET | ORAL | Status: DC

## 2021-10-25 MED ORDER — GABAPENTIN 100 MG PO CAPS
100.0000 mg | ORAL_CAPSULE | Freq: Three times a day (TID) | ORAL | Status: DC
  Administered 2021-10-25 – 2021-10-31 (×18): 100 mg via ORAL
  Filled 2021-10-25 (×19): qty 1

## 2021-10-25 MED ORDER — BUSPIRONE HCL 5 MG PO TABS
15.0000 mg | ORAL_TABLET | Freq: Two times a day (BID) | ORAL | Status: DC
  Administered 2021-10-25 – 2021-11-07 (×25): 15 mg via ORAL
  Filled 2021-10-25 (×25): qty 1

## 2021-10-25 MED ORDER — VALACYCLOVIR HCL 500 MG PO TABS: 1000.0000 mg | ORAL_TABLET | Freq: Every day | ORAL | Status: DC

## 2021-10-25 MED ORDER — GABAPENTIN 400 MG PO CAPS: 400.0000 mg | ORAL_CAPSULE | Freq: Three times a day (TID) | ORAL | Status: DC

## 2021-10-25 MED ORDER — POTASSIUM CHLORIDE 10 MEQ/100ML IV SOLN
10.0000 meq | INTRAVENOUS | Status: AC
  Administered 2021-10-25 (×4): 10 meq via INTRAVENOUS
  Filled 2021-10-25 (×4): qty 100

## 2021-10-25 MED ORDER — TOPIRAMATE 25 MG PO TABS: 100.0000 mg | ORAL_TABLET | Freq: Every day | ORAL | Status: DC

## 2021-10-25 MED ORDER — TRAZODONE HCL 50 MG PO TABS: 150.0000 mg | ORAL_TABLET | Freq: Every day | ORAL | Status: DC

## 2021-10-25 NOTE — ED Notes (Signed)
Pt needed to use the bathroom, two RNs assisted in using bedside commode.  Pt was slow to move but was able to comply w/ RN's directions. She is mildly anxious but was able to be verbally redirected.

## 2021-10-25 NOTE — Progress Notes (Signed)
PROGRESS NOTE    HAYLEA SCHLICHTING  IRJ:[id number] DOB: 1951/04/20 DOA: 10/24/2021 PCP: No primary care provider on file.    Brief Narrative:  Mrs. Merlino was admitted to the hospital with the working diagnosis of metabolic encephalopathy.    70 yo female with the past medical history of CVA, COPD, CKD, GERD, HTN, dyslipidemia, depression and bipolar who presented with altered mental status. Patient was noted to be herself, EMS was called and patient was brought to the hospital. On her initial physical examination patient was orientated to self but not time, her blood pressure was 161/89, hr 84,  rr 17 and oxygen saturation 98% Her lungs were clear to auscultation, heart S1 and S2 present, and rhythmic, abdomen soft and non tender, no lower extremity edema.  Neurologically not focal.   Na 134, K 2,5, Cl 98, bicarbonate 24, glucose 104, BUN 8 and serum cr at 0,89. Wbc 12,5, hgb 14,2, hct at 40,6 and Plt 304.  SARS covid 19 negative  Urine with SG 1,010 with negative nitrates, 0-5 wbc  Head CT negative for acute changes. Atrophy and chronic small vessel ischemic changes of the white matter. Multiple chronic infarcts.   Chest film positive for cardiomegaly   EKG 85 bpm, left axis deviation, qtc 509, sinus rhythm, no significant ST segment or T wave changes.   Assessment & Plan:   Principal Problem:   Acute encephalopathy Active Problems:   Hypokalemia   Anxiety   Bipolar affective disorder in remission (HCC)   Depression   CKD (chronic kidney disease)   COPD (chronic obstructive pulmonary disease) (HCC)   GERD (gastroesophageal reflux disease)   History of stroke   Mixed hyperlipidemia   Primary hypertension   Metabolic encephalopathy   Acute metabolic encephalopathy in the setting of prior CVA, bipolar, depression, and anxiety. Multifactorial, patient acknowledges being severely confused at home. Patient today is orientated to place and person but not time.   Plan to  continue supportive medical therapy  At home patient on sertraline, buspirone, topiramate (reduce dose) and trazodone (reduce dose) medications will be resumed to prevent withdrawal. Decrease dos of gabapentin to 100 mg po tid.   Continue close neuro checks, consult PT and OT Nutrition consultation Add thiamine and multivitamins.  Urine analysis negative for pyuria, will discontinue antibiotic therapy.   2. Hypokalemia, hyponatremia CKD stage 2. Stable renal function with serum cr at 1,0, K now is up to 3,3 and serum bicarbonate at 24. Na 135. Patient is tolerating po well.  Plan to discontinue IV fluids and add 40 meq Kcl po x1  Follow up renal function and electrolytes in am.   3. HTN/ dyslipidemia. At home on HCTZ.  Blood pressure has been elevated, will add amlodipine for better control.   Continue to hold diuretic therapy due to hypokalemia.  On statin therapy  4. COPD. No signs of acute exacerbation    Patient continue to be at high risk for worsening hypokalemia and encephalopathy   Status is: Inpatient  Remains inpatient appropriate because: neuro checks and electrolyte correction    DVT prophylaxis: Enoxaparin   Code Status:   full  Family Communication: I spoke over the phone with the patient's son about patient's  condition, plan of care, prognosis and all questions were addressed.   Subjective: Patient continue to be disorientated to time but not place and personal no nausea or vomiting, no chest pain or dyspnea.   Objective: Vitals:   10/25/21 1340 10/25/21 1345 10/25/21 1400  10/25/21 1418  BP: (!) 183/93 (!) 179/83 (!) 177/86 (!) 158/96  Pulse: 89 89 94 88  Resp: 19 18  18   Temp:    98.2 F (36.8 C)  TempSrc:    Oral  SpO2: 96% 96% 96% 98%   No intake or output data in the 24 hours ending 10/25/21 1526 There were no vitals filed for this visit.   Examination:   General: Not in pain or dyspnea,. Deconditioned and ill looking appearing  Neurology:  Awake and alert, non focal. Disorientated time time and situation but not place and person.  E ENT: mild pallor, no icterus, oral mucosa moist Cardiovascular: No JVD. S1-S2 present, rhythmic, no gallops, rubs, or murmurs. No lower extremity edema. Pulmonary: positive breath sounds bilaterally, adequate air movement, no wheezing, rhonchi or rales. Gastrointestinal. Abdomen soft and non tender Skin. No rashes Musculoskeletal: no joint deformities   Data Reviewed: I have personally reviewed following labs and imaging studies  CBC: Recent Labs  Lab 10/24/21 1442 10/24/21 1523 10/25/21 0245  WBC 12.5*  --  10.8*  NEUTROABS 7.7  --   --   HGB 14.2 13.9 13.8  HCT 40.6 41.0 41.0  MCV 84.8  --  88.0  PLT 304  --  786   Basic Metabolic Panel: Recent Labs  Lab 10/24/21 1442 10/24/21 1523 10/24/21 2016 10/24/21 2240 10/25/21 0245  NA 134* 133*  --   --  132*  K 2.5* 2.4*  --  2.5* 2.7*  CL 98 98  --   --  98  CO2 24  --   --   --  24  GLUCOSE 104* 107*  --   --  134*  BUN 8 8  --   --  9  CREATININE 0.89 0.80  --   --  0.97  CALCIUM 9.2  --   --   --  8.8*  MG  --   --  1.8  --   --    GFR: CrCl cannot be calculated (Unknown ideal weight.). Liver Function Tests: Recent Labs  Lab 10/24/21 1442 10/25/21 0245  AST 30 37  ALT 27 28  ALKPHOS 107 103  BILITOT 0.7 0.8  PROT 6.7 6.1*  ALBUMIN 3.5 3.1*   No results for input(s): LIPASE, AMYLASE in the last 168 hours. Recent Labs  Lab 10/24/21 1918  AMMONIA 16   Coagulation Profile: Recent Labs  Lab 10/24/21 1442  INR 1.0   Cardiac Enzymes: No results for input(s): CKTOTAL, CKMB, CKMBINDEX, TROPONINI in the last 168 hours. BNP (last 3 results) No results for input(s): PROBNP in the last 8760 hours. HbA1C: No results for input(s): HGBA1C in the last 72 hours. CBG: No results for input(s): GLUCAP in the last 168 hours. Lipid Profile: No results for input(s): CHOL, HDL, LDLCALC, TRIG, CHOLHDL, LDLDIRECT in the last  72 hours. Thyroid Function Tests: No results for input(s): TSH, T4TOTAL, FREET4, T3FREE, THYROIDAB in the last 72 hours. Anemia Panel: No results for input(s): VITAMINB12, FOLATE, FERRITIN, TIBC, IRON, RETICCTPCT in the last 72 hours.    Radiology Studies: I have reviewed all of the imaging during this hospital visit personally     Scheduled Meds:  aspirin EC  81 mg Oral Daily   atorvastatin  40 mg Oral QHS   enoxaparin (LOVENOX) injection  40 mg Subcutaneous Q24H   melatonin  10 mg Oral QHS,MR X 1   mometasone-formoterol  2 puff Inhalation BID   pantoprazole  40 mg Oral  Daily   sodium chloride flush  3 mL Intravenous Once   Continuous Infusions:  sodium chloride 100 mL/hr at 10/25/21 1211   cefTRIAXone (ROCEPHIN)  IV Stopped (10/25/21 0319)     LOS: 0 days        Khaleb Broz Gerome Apley, MD

## 2021-10-25 NOTE — ED Notes (Signed)
Pt c/o wanting to go home.  RN was able to de-esclate and she agreed to try to rest.

## 2021-10-26 DIAGNOSIS — F3170 Bipolar disorder, currently in remission, most recent episode unspecified: Secondary | ICD-10-CM | POA: Diagnosis not present

## 2021-10-26 DIAGNOSIS — E876 Hypokalemia: Secondary | ICD-10-CM | POA: Diagnosis not present

## 2021-10-26 DIAGNOSIS — G9340 Encephalopathy, unspecified: Secondary | ICD-10-CM | POA: Diagnosis not present

## 2021-10-26 DIAGNOSIS — F419 Anxiety disorder, unspecified: Secondary | ICD-10-CM | POA: Diagnosis not present

## 2021-10-26 LAB — BASIC METABOLIC PANEL
Anion gap: 9 (ref 5–15)
BUN: 10 mg/dL (ref 8–23)
CO2: 22 mmol/L (ref 22–32)
Calcium: 8.7 mg/dL — ABNORMAL LOW (ref 8.9–10.3)
Chloride: 107 mmol/L (ref 98–111)
Creatinine, Ser: 0.93 mg/dL (ref 0.44–1.00)
GFR, Estimated: >60 mL/min (ref >60)
Glucose, Bld: 88 mg/dL (ref 70–99)
Potassium: 3.7 mmol/L (ref 3.5–5.1)
Sodium: 138 mmol/L (ref 135–145)

## 2021-10-26 LAB — MAGNESIUM: Magnesium: 1.7 mg/dL (ref 1.7–2.4)

## 2021-10-26 MED ORDER — AMLODIPINE BESYLATE 5 MG PO TABS
5.0000 mg | ORAL_TABLET | Freq: Once | ORAL | Status: AC
  Administered 2021-10-26: 5 mg via ORAL
  Filled 2021-10-26: qty 1

## 2021-10-26 MED ORDER — ENSURE ENLIVE PO LIQD
237.0000 mL | Freq: Two times a day (BID) | ORAL | Status: DC
  Administered 2021-10-26 – 2021-11-07 (×17): 237 mL via ORAL

## 2021-10-26 MED ORDER — POTASSIUM CHLORIDE CRYS ER 20 MEQ PO TBCR
40.0000 meq | EXTENDED_RELEASE_TABLET | Freq: Once | ORAL | Status: AC
  Administered 2021-10-26: 40 meq via ORAL
  Filled 2021-10-26: qty 2

## 2021-10-26 MED ORDER — AMLODIPINE BESYLATE 10 MG PO TABS
10.0000 mg | ORAL_TABLET | Freq: Every day | ORAL | Status: DC
  Administered 2021-10-27 – 2021-11-07 (×12): 10 mg via ORAL
  Filled 2021-10-26 (×12): qty 1

## 2021-10-26 MED ORDER — MAGNESIUM SULFATE 2 GM/50ML IV SOLN
2.0000 g | Freq: Once | INTRAVENOUS | Status: AC
  Administered 2021-10-26: 2 g via INTRAVENOUS
  Filled 2021-10-26: qty 50

## 2021-10-26 NOTE — Progress Notes (Signed)
Assisted patient with her phone to call son (Dwayne) .   Patient told son she was being discharged but corrected info with son that she is not ready to be dc'd at this time.  Informed son that patient was being evaluated by PT/OT and recommendations would be made.  Son reports he is in  [place] and would like for her to come to [place] to live near him but his home is not appropriate for her to live there with him at this time.  He reports he has applied for some "apartments" for her to live in that would be near him.  Informed son that CM/SW would contact him soon.  His phone number is [phone].  He also reports patient's grandson helps her here but reports he "is not all there" and may not have helped patient with medications needed.  Yolanda Bonine is Josh [phone].

## 2021-10-26 NOTE — Progress Notes (Signed)
Initial Nutrition Assessment  DOCUMENTATION CODES:   Non-severe (moderate) malnutrition in context of chronic illness  INTERVENTION:   Continue Multivitamin w/ minerals daily  Ensure Enlive po BID, each supplement provides 350 kcal and 20 grams of protein  Encourage good PO intake  NUTRITION DIAGNOSIS:   Moderate Malnutrition related to chronic illness (COPD) as evidenced by moderate muscle depletion, mild fat depletion, percent weight loss.  GOAL:   Patient will meet greater than or equal to 90% of their needs  MONITOR:   PO intake, Weight trends, Supplement acceptance, Labs  REASON FOR ASSESSMENT:   Consult Assessment of nutrition requirement/status  ASSESSMENT:   70 y.o. female presented to the ED with altered mental statues. Pt recently admitted 10/23 also for altered mental status caused by UTI. PMH includes COPD, CKD, bipolar, diverticulosis, GERD, HTN, and a stroke back in August. Pt admitted with acute encephalopathy and unknown cause.   Pt resting in bed at time of visit, not very talkative, answers questions with very few words. Pt answered some questions with that she could not remember.   Pt reports that she has been experiencing a decrease in her appetite. Reports a typical intake of Breakfast: [place] toast and sausage, Lunch: "can't remember", Dinner: BBQ chicken and potato salad.  Pt reports a UBW of 130# and that she has experienced a 10# weight loss within the past two weeks. No new admission weight in EMR, RN notified. RN able to obtain new weight. Per EMR, pt has had a 12% weight loss within 3 months, this is clinically significant for time frame. Pt reports that she uses a walker at home to ambulate.  Discussed ONS with pt; pt agreeable to Ensure.     Medications reviewed and include: MVI, Protonix, Thiamine Labs reviewed.    NUTRITION - FOCUSED PHYSICAL EXAM:  Flowsheet Row Most Recent Value  Orbital Region Mild depletion  Upper Arm Region No  depletion  Thoracic and Lumbar Region No depletion  Buccal Region Mild depletion  Temple Region No depletion  Clavicle Bone Region Mild depletion  Clavicle and Acromion Bone Region Mild depletion  Scapular Bone Region Mild depletion  Dorsal Hand Mild depletion  Patellar Region Moderate depletion  Anterior Thigh Region Moderate depletion  Posterior Calf Region Moderate depletion  Edema (RD Assessment) None  Hair Reviewed  Eyes Reviewed  Mouth Reviewed  Skin Reviewed  Nails Reviewed       Diet Order:   Diet Order             Diet regular Room service appropriate? Yes; Fluid consistency: Thin  Diet effective now                   EDUCATION NEEDS:   No education needs have been identified at this time  Skin:  Skin Assessment: Reviewed RN Assessment  Last BM:  No Documentation  Height:   Ht Readings from Last 1 Encounters:  08/16/21 5\' 3"  (1.6 m)    Weight:   Wt Readings from Last 1 Encounters:  10/26/21 70.5 kg    Ideal Body Weight:  52.3 kg  BMI:  Body mass index is 27.53 kg/m.  Estimated Nutritional Needs:   Kcal:  2000-2200  Protein:  100-115 grams  Fluid:  > 2 L    Tehillah Cipriani BS, PLDN Clinical Dietitian See AMiON for contact information.

## 2021-10-26 NOTE — Progress Notes (Signed)
PROGRESS NOTE    Sandra Robinson  KJZ:[id number] DOB: 28-Apr-1951 DOA: 10/24/2021 PCP: No primary care provider on file.    Brief Narrative:  Sandra Robinson was admitted to the hospital with the working diagnosis of metabolic encephalopathy.     70 yo female with the past medical history of CVA, COPD, CKD, GERD, HTN, dyslipidemia, depression and bipolar who presented with altered mental status. Patient was noted to be herself, EMS was called and patient was brought to the hospital. On her initial physical examination patient was orientated to self but not time, her blood pressure was 161/89, hr 84,  rr 17 and oxygen saturation 98% Her lungs were clear to auscultation, heart S1 and S2 present, and rhythmic, abdomen soft and non tender, no lower extremity edema.  Neurologically not focal.    Na 134, K 2,5, Cl 98, bicarbonate 24, glucose 104, BUN 8 and serum cr at 0,89. Wbc 12,5, hgb 14,2, hct at 40,6 and Plt 304.  SARS covid 19 negative   Urine with SG 1,010 with negative nitrates, 0-5 wbc   Head CT negative for acute changes. Atrophy and chronic small vessel ischemic changes of the white matter. Multiple chronic infarcts.    Chest film positive for cardiomegaly    EKG 85 bpm, left axis deviation, qtc 509, sinus rhythm, no significant ST segment or T wave changes.   Patient was admitted to the medical ward for close neuro checks. Her psychotropic medications have been adjusted. Placed on vitamins and nutritional supplements.  Consulted PT and OT.   Assessment & Plan:   Principal Problem:   Acute encephalopathy Active Problems:   Hypokalemia   Anxiety   Bipolar affective disorder in remission (HCC)   Depression   CKD (chronic kidney disease)   COPD (chronic obstructive pulmonary disease) (HCC)   GERD (gastroesophageal reflux disease)   History of stroke   Mixed hyperlipidemia   Primary hypertension   Metabolic encephalopathy   Acute metabolic encephalopathy in the setting  of prior CVA, bipolar, depression, and anxiety.  Confusion has been improving, patient very weak and deconditioned, not back to her baseline.   Continue with sertraline, buspirone, topiramate (reduce dose) and trazodone (reduce dose) with good toleration. Continue with gabapentin to 100 mg po tid.  Nutritional support with thiamine (high dose) and multivitamins, follow with nutrition recommendation for supplements. Follow with PT and Ot recommendations.  .    2. Hypokalemia, hyponatremia CKD stage 2.  Stable renal function with serum cr at 0.93, K is down to 3,7 and Mg 1,7. Will add 40 meq Kcl and 2 g of Mag sulfate.  Follow electrolytes in am.  Continue to hold on IV fluids, and encourage po intake.    3. HTN/ dyslipidemia.  Blood pressure continue to be elevated 948 to 016 systolic, will increase amlodipine to 10 mg daily. Continue to hold on HCTZ due to electrolyte disturbances.    Continue with statin therapy.    4. COPD. No clinical signs of acute exacerbation  Continue with albuterol and dulera.  Patient continue to be at high risk for worsening encephalopathy   Status is: Inpatient  Remains inpatient appropriate because: supportive medical therapy.   DVT prophylaxis: Enoxaparin   Code Status:    full  Family Communication:   No family at the bedside      Subjective: Patient with no nausea or vomiting, continue to be very weak and deconditioned, confusion is improving but she does not feel back to her baseline.  Objective: Vitals:   10/25/21 2033 10/26/21 0452 10/26/21 0749 10/26/21 0910  BP:  (!) 157/71 (!) 160/89   Pulse:  76 90   Resp:  20 17   Temp:  98.8 F (37.1 C) 98.5 F (36.9 C)   TempSrc:   Oral   SpO2: 94% 100% 95% 95%   No intake or output data in the 24 hours ending 10/26/21 1039 There were no vitals filed for this visit.  Examination:   General: Not in pain or dyspnea, deconditioned  Neurology: Awake and alert, non focal  E ENT: mild  pallor, no icterus, oral mucosa moist Cardiovascular: No JVD. S1-S2 present, rhythmic, no gallops, rubs, or murmurs. No lower extremity edema. Pulmonary: positive breath sounds bilaterally, adequate air movement, no wheezing, rhonchi or rales. Gastrointestinal. Abdomen soft and non tender Skin. No rashes Musculoskeletal: no joint deformities     Data Reviewed: I have personally reviewed following labs and imaging studies  CBC: Recent Labs  Lab 10/24/21 1442 10/24/21 1523 10/25/21 0245  WBC 12.5*  --  10.8*  NEUTROABS 7.7  --   --   HGB 14.2 13.9 13.8  HCT 40.6 41.0 41.0  MCV 84.8  --  88.0  PLT 304  --  956   Basic Metabolic Panel: Recent Labs  Lab 10/24/21 1442 10/24/21 1523 10/24/21 2016 10/24/21 2240 10/25/21 0245 10/25/21 1423 10/26/21 0248  NA 134* 133*  --   --  132* 135 138  K 2.5* 2.4*  --  2.5* 2.7* 3.3* 3.7  CL 98 98  --   --  98 102 107  CO2 24  --   --   --  24 24 22   GLUCOSE 104* 107*  --   --  134* 132* 88  BUN 8 8  --   --  9 9 10   CREATININE 0.89 0.80  --   --  0.97 1.03* 0.93  CALCIUM 9.2  --   --   --  8.8* 8.7* 8.7*  MG  --   --  1.8  --   --   --  1.7   GFR: CrCl cannot be calculated (Unknown ideal weight.). Liver Function Tests: Recent Labs  Lab 10/24/21 1442 10/25/21 0245  AST 30 37  ALT 27 28  ALKPHOS 107 103  BILITOT 0.7 0.8  PROT 6.7 6.1*  ALBUMIN 3.5 3.1*   No results for input(s): LIPASE, AMYLASE in the last 168 hours. Recent Labs  Lab 10/24/21 1918  AMMONIA 16   Coagulation Profile: Recent Labs  Lab 10/24/21 1442  INR 1.0   Cardiac Enzymes: No results for input(s): CKTOTAL, CKMB, CKMBINDEX, TROPONINI in the last 168 hours. BNP (last 3 results) No results for input(s): PROBNP in the last 8760 hours. HbA1C: No results for input(s): HGBA1C in the last 72 hours. CBG: No results for input(s): GLUCAP in the last 168 hours. Lipid Profile: No results for input(s): CHOL, HDL, LDLCALC, TRIG, CHOLHDL, LDLDIRECT in the  last 72 hours. Thyroid Function Tests: No results for input(s): TSH, T4TOTAL, FREET4, T3FREE, THYROIDAB in the last 72 hours. Anemia Panel: No results for input(s): VITAMINB12, FOLATE, FERRITIN, TIBC, IRON, RETICCTPCT in the last 72 hours.    Radiology Studies: I have reviewed all of the imaging during this hospital visit personally     Scheduled Meds:  amLODipine  5 mg Oral Daily   aspirin EC  81 mg Oral Daily   atorvastatin  40 mg Oral QHS   busPIRone  15  mg Oral BID   enoxaparin (LOVENOX) injection  40 mg Subcutaneous Q24H   gabapentin  100 mg Oral TID   mometasone-formoterol  2 puff Inhalation BID   multivitamins with iron  1 tablet Oral Daily   pantoprazole  40 mg Oral Daily   sertraline  100 mg Oral q morning   sodium chloride flush  3 mL Intravenous Once   thiamine  250 mg Oral BID   topiramate  50 mg Oral Daily   traZODone  50 mg Oral QHS   Continuous Infusions:   LOS: 1 day        Sandra Noren Gerome Apley, MD

## 2021-10-26 NOTE — Progress Notes (Signed)
Mobility Specialist Criteria Algorithm Info.  Mobility Team: HOB elevated: Activity: Ambulated in room (In chair before and after) Range of motion: Active; All extremities Level of assistance: Minimal assist, patient does 75% or more Assistive device: Front wheel walker Mobility response: Tolerated fair Bed Position: Chair  Patient received in recliner chair pleasantly agreed to participate in mobility. Required minimal assistance sit<>stand + cues for hand placement. Ambulated short distance in room with close min G. LOBx1 but able to gather balance independently. Returned to recliner chair without incident or complaint. Was left with all needs met and call bell in reach.   10/26/2021 4:23 PM

## 2021-10-26 NOTE — Evaluation (Signed)
Occupational Therapy Evaluation Patient Details Name: Sandra Robinson MRN: [id number] DOB: 1951-11-05 Today's Date: 10/26/2021   History of Present Illness Pt is pleasantly confused 70 y/o F admitted 10/24/21 with acute encephalopathy after having AMS at home. PMH includes CVA, anxiety, bipolar, depression, COPD, CKD, GERD, diverticulosis, hyperlipidemia, hypertension.   Clinical Impression   Pt presents with decreased balance, activity tolerance, strength and cognition. Pt currently requiring Min A with most ADLs and Mod A for functional transfers/mobility. Requiring significant cues for executive function and safety during activity and mobility, which appears to be primary barrier to independence at this time. Pt appears to be poor historian, reporting she lives alone with no assistance at home, however is not safe to return home without assistance at this time Recommend SNF, however could likely return [facility] with [facility] if frequent supervision available. Will follow acutely.     Recommendations for follow up therapy are one component of a multi-disciplinary discharge planning process, led by the attending physician.  Recommendations may be updated based on patient status, additional functional criteria and insurance authorization.   Follow Up Recommendations  Skilled nursing-short term rehab (<3 hours/day)    Assistance Recommended at Discharge Frequent or constant Supervision/Assistance  Functional Status Assessment  Patient has had a recent decline in their functional status and/or demonstrates limited ability to make significant improvements in function in a reasonable and predictable amount of time  Equipment Recommendations  Other (comment) (Defer)    Recommendations for Other Services PT consult     Precautions / Restrictions Precautions Precautions: Fall Restrictions Weight Bearing Restrictions: No      Mobility Bed Mobility Overal bed mobility: Needs Assistance Bed Mobility:  Supine to Sit     Supine to sit: Min assist          Transfers Overall transfer level: Needs assistance Equipment used: Rolling walker (2 wheels) Transfers: Sit to/from Stand Sit to Stand: Min assist                  Balance Overall balance assessment: Needs assistance Sitting-balance support: Feet supported Sitting balance-Leahy Scale: Fair     Standing balance support: During functional activity Standing balance-Leahy Scale: Poor                             ADL either performed or assessed with clinical judgement   ADL Overall ADL's : Needs assistance/impaired Eating/Feeding: Independent;Sitting   Grooming: Set up;Sitting   Upper Body Bathing: Supervision/ safety;Sitting   Lower Body Bathing: Minimal assistance;Cueing for safety   Upper Body Dressing : Set up;Sitting   Lower Body Dressing: Minimal assistance;Cueing for safety   Toilet Transfer: Moderate assistance;Cueing for safety;Cueing for sequencing;Regular Toilet;Rolling walker (2 wheels);Ambulation           Functional mobility during ADLs: Minimal assistance;Cueing for safety;Cueing for sequencing;Rolling walker (2 wheels) General ADL Comments: Limited by balance, activity tolerance and cognition. Slow processing and decreased initiation, requiring extended time and extensive cueing during activity.     Vision   Vision Assessment?: No apparent visual deficits     Perception     Praxis      Pertinent Vitals/Pain Pain Assessment: No/denies pain     Hand Dominance Left   Extremity/Trunk Assessment Upper Extremity Assessment Upper Extremity Assessment: Generalized weakness   Lower Extremity Assessment Lower Extremity Assessment: Defer to PT evaluation   Cervical / Trunk Assessment Cervical / Trunk Assessment: Kyphotic   Communication Communication Communication:  No difficulties   Cognition Arousal/Alertness: Awake/alert Behavior During Therapy: Flat affect Overall  Cognitive Status: Impaired/Different from baseline Area of Impairment: Orientation;Attention;Memory;Following commands;Awareness                 Orientation Level: Time Current Attention Level: Selective Memory: Decreased short-term memory Following Commands: Follows one step commands inconsistently   Awareness: Emergent   General Comments: Pt disoriented and appears confused. Thinks year is 54 and "can't believe it" when told it is 2022. Also states that she was playing baseball yesterday (shortstop) which seems unlikely. Slow processing and decreased initiation.     General Comments       Exercises     Shoulder Instructions      Home Living Family/patient expects to be discharged to:: Private residence Living Arrangements: Alone Available Help at Discharge: Friend(s);Available PRN/intermittently Type of Home: House Home Access: Stairs to enter [employer] of Steps: 6 Entrance Stairs-Rails: Left;Right Home Layout: Two level;Able to live on main level with bedroom/bathroom Alternate Level Stairs-Number of Steps: flight Alternate Level Stairs-Rails: Left;Right Bathroom Shower/Tub: Teacher, early years/pre: Standard     Home Equipment: Conservation officer, nature (2 wheels);Grab bars - toilet;Grab bars - tub/shower;Shower seat;BSC/3in1   Additional Comments: Pt confused and may be poor historian. Stated she had "friends here and there" who could provide asssistance. Began answering"yes" to every question about home environment. Unable to verify.      Prior Functioning/Environment Prior Level of Function : Patient poor historian/Family not available               ADLs Comments: Reports living alone and independent with ADLs, IADLs, and driving.        OT Problem List: Decreased strength;Decreased activity tolerance;Impaired balance (sitting and/or standing);Decreased cognition;Decreased safety awareness;Decreased knowledge of use of DME or  AE;Decreased knowledge of precautions      OT Treatment/Interventions: Self-care/ADL training;Therapeutic exercise;Neuromuscular education;Energy conservation;DME and/or AE instruction;Therapeutic activities;Patient/family education;Balance training;Cognitive remediation/compensation    OT Goals(Current goals can be found in the care plan section) Acute Rehab OT Goals Patient Stated Goal: none stated OT Goal Formulation: Patient unable to participate in goal setting Time For Goal Achievement: 11/09/21 Potential to Achieve Goals: Fair  OT Frequency: Min 2X/week   Barriers to D/C:            Co-evaluation              AM-PAC OT "6 Clicks" Daily Activity     Outcome Measure Help from another person eating meals?: None Help from another person taking care of personal grooming?: A Little Help from another person toileting, which includes using toliet, bedpan, or urinal?: A Lot Help from another person bathing (including washing, rinsing, drying)?: A Little Help from another person to put on and taking off regular upper body clothing?: A Little Help from another person to put on and taking off regular lower body clothing?: A Little 6 Click Score: 18   End of Session Equipment Utilized During Treatment: Gait belt;Rolling walker (2 wheels) Nurse Communication: Mobility status  Activity Tolerance: Patient tolerated treatment well Patient left: in chair;with call bell/phone within reach;with chair alarm set;with nursing/sitter in room  OT Visit Diagnosis: Unsteadiness on feet (R26.81);Other abnormalities of gait and mobility (R26.89);Muscle weakness (generalized) (M62.81);Other symptoms and signs involving cognitive function                Time: 9024-0973 OT Time Calculation (min): 29 min Charges:  OT General Charges $OT Visit: 1 Visit OT Evaluation $  OT Eval Moderate Complexity: 1 Mod OT Treatments $Self Care/Home Management : 8-22 mins  Carie Kapuscinski C, OT/L  Acute  Rehab [place] 10/26/2021, 1:33 PM

## 2021-10-26 NOTE — Evaluation (Signed)
Physical Therapy Evaluation Patient Details Name: Sandra Robinson MRN: [id number] DOB: [birth year] Today's Date: 10/26/2021  History of Present Illness  Pt is pleasantly confused 70 y/o F admitted 10/24/21 with acute encephalopathy after having AMS at home. PMH includes CVA, anxiety, bipolar, depression, COPD, CKD, GERD, diverticulosis, hyperlipidemia, hypertension.  Clinical Impression  Pt admitted with/for AMS.  Work up continues.  Pt needing min to mod assist for basic mobility and gait.  Pt currently limited functionally due to the problems listed. ( See problems list.)   Pt will benefit from PT to maximize function and safety in order to get ready for next venue listed below.        Recommendations for follow up therapy are one component of a multi-disciplinary discharge planning process, led by the attending physician.  Recommendations may be updated based on patient status, additional functional criteria and insurance authorization.  Follow Up Recommendations Skilled nursing-short term rehab (<3 hours/day)    Assistance Recommended at Discharge Intermittent Supervision/Assistance  Functional Status Assessment Patient has had a recent decline in their functional status and demonstrates the ability to make significant improvements in function in a reasonable and predictable amount of time.  Equipment Recommendations  Other (comment) (TBD)    Recommendations for Other Services       Precautions / Restrictions Precautions Precautions: Fall      Mobility  Bed Mobility Overal bed mobility: Needs Assistance Bed Mobility: Supine to Sit     Supine to sit: Min assist          Transfers Overall transfer level: Needs assistance Equipment used: Rolling walker (2 wheels) Transfers: Sit to/from Stand Sit to Stand: Min assist                Ambulation/Gait Ambulation/Gait assistance: Mod assist Gait Distance (Feet): 30 Feet Assistive device: 1 person hand held  assist Gait Pattern/deviations: Step-to pattern;Step-through pattern;Decreased step length - right;Decreased step length - left;Decreased stance time - left;Decreased stride length Gait velocity: slower Gait velocity interpretation: <1.31 ft/sec, indicative of household ambulator   General Gait Details: Pt's gait is paretic in nature with uncoordinated w/shift, advancement of L LE and pt also not confident w/bearing on the L LE.  Stairs            Wheelchair Mobility    Modified Rankin (Stroke Patients Only)       Balance Overall balance assessment: Needs assistance Sitting-balance support: Feet supported Sitting balance-Leahy Scale: Fair     Standing balance support: During functional activity;Single extremity supported Standing balance-Leahy Scale: Poor Standing balance comment: reliant on external support                             Pertinent Vitals/Pain Pain Assessment: No/denies pain    Home Living Family/patient expects to be discharged to:: Private residence Living Arrangements: Alone Available Help at Discharge: Friend(s);Available PRN/intermittently Type of Home: House Home Access: Stairs to enter Entrance Stairs-Rails: Left;Right Entrance Stairs-Number of Steps: 6 Alternate Level Stairs-Number of Steps: flight Home Layout: Two level;Able to live on main level with bedroom/bathroom Home Equipment: Rolling Walker (2 wheels);Grab bars - toilet;Grab bars - tub/shower;Shower seat;BSC/3in1 Additional Comments: Pt confused and may be poor historian. She took a long time to bring up that her daughter lives just down the street.    Prior Function Prior Level of Function : Patient poor historian/Family not available  ADLs Comments: Reports living alone and independent with ADLs, IADLs, and driving.     Hand Dominance   Dominant Hand: Left    Extremity/Trunk Assessment   Upper Extremity Assessment Upper Extremity Assessment:  Generalized weakness (L notably weaker than R UE)    Lower Extremity Assessment Lower Extremity Assessment: Generalized weakness (L notably weaker than R LE)    Cervical / Trunk Assessment Cervical / Trunk Assessment: Kyphotic  Communication   Communication: No difficulties  Cognition Arousal/Alertness: Awake/alert Behavior During Therapy: Flat affect Overall Cognitive Status: Impaired/Different from baseline Area of Impairment: Orientation;Attention;Memory;Following commands;Awareness                 Orientation Level: Time Current Attention Level: Selective Memory: Decreased short-term memory Following Commands: Follows one step commands inconsistently   Awareness: Emergent            General Comments      Exercises     Assessment/Plan    PT Assessment Patient needs continued PT services  PT Problem List Decreased strength;Decreased activity tolerance;Decreased balance;Decreased mobility;Decreased coordination;Decreased safety awareness       PT Treatment Interventions DME instruction;Gait training;Functional mobility training;Therapeutic activities;Balance training;Patient/family education    PT Goals (Current goals can be found in the Care Plan section)  Acute Rehab PT Goals Patient Stated Goal: pt unable to participate PT Goal Formulation: Patient unable to participate in goal setting Time For Goal Achievement: 11/09/21 Potential to Achieve Goals: Fair    Frequency Min 3X/week   Barriers to discharge        Co-evaluation               AM-PAC PT "6 Clicks" Mobility  Outcome Measure Help needed turning from your back to your side while in a flat bed without using bedrails?: A Little Help needed moving from lying on your back to sitting on the side of a flat bed without using bedrails?: A Little Help needed moving to and from a bed to a chair (including a wheelchair)?: A Lot Help needed standing up from a chair using your arms (e.g.,  wheelchair or bedside chair)?: A Lot Help needed to walk in hospital room?: A Lot Help needed climbing 3-5 steps with a railing? : A Lot 6 Click Score: 14    End of Session   Activity Tolerance: Patient tolerated treatment well;Patient limited by fatigue Patient left: in bed;with call bell/phone within reach;with bed alarm set Nurse Communication: Mobility status PT Visit Diagnosis: Unsteadiness on feet (R26.81);Other abnormalities of gait and mobility (R26.89);Muscle weakness (generalized) (M62.81)    Time: 8099-8338 PT Time Calculation (min) (ACUTE ONLY): 16 min   Charges:   PT Evaluation $PT Eval Moderate Complexity: 1 Mod          10/26/2021  Ginger Carne., PT Acute Rehabilitation Services [phone]  (pager) [phone]  (office)  Tessie Fass Qualyn Oyervides 10/26/2021, 6:51 PM

## 2021-10-27 DIAGNOSIS — F32A Depression, unspecified: Secondary | ICD-10-CM | POA: Diagnosis not present

## 2021-10-27 DIAGNOSIS — N189 Chronic kidney disease, unspecified: Secondary | ICD-10-CM | POA: Diagnosis not present

## 2021-10-27 DIAGNOSIS — F3170 Bipolar disorder, currently in remission, most recent episode unspecified: Secondary | ICD-10-CM | POA: Diagnosis not present

## 2021-10-27 DIAGNOSIS — E440 Moderate protein-calorie malnutrition: Secondary | ICD-10-CM | POA: Insufficient documentation

## 2021-10-27 DIAGNOSIS — G9340 Encephalopathy, unspecified: Secondary | ICD-10-CM | POA: Diagnosis not present

## 2021-10-27 LAB — BASIC METABOLIC PANEL
Anion gap: 3 — ABNORMAL LOW (ref 5–15)
BUN: 15 mg/dL (ref 8–23)
CO2: 26 mmol/L (ref 22–32)
Calcium: 8.6 mg/dL — ABNORMAL LOW (ref 8.9–10.3)
Chloride: 108 mmol/L (ref 98–111)
Creatinine, Ser: 0.83 mg/dL (ref 0.44–1.00)
GFR, Estimated: >60 mL/min (ref >60)
Glucose, Bld: 87 mg/dL (ref 70–99)
Potassium: 4.3 mmol/L (ref 3.5–5.1)
Sodium: 137 mmol/L (ref 135–145)

## 2021-10-27 LAB — MAGNESIUM: Magnesium: 2.3 mg/dL (ref 1.7–2.4)

## 2021-10-27 MED ORDER — MELATONIN 5 MG PO TABS
10.0000 mg | ORAL_TABLET | Freq: Every evening | ORAL | Status: DC | PRN
  Administered 2021-10-27 – 2021-11-05 (×6): 10 mg via ORAL
  Filled 2021-10-27 (×6): qty 2

## 2021-10-27 NOTE — Care Management Important Message (Signed)
Important Message  Patient Details  Name: Sandra Robinson MRN: [id number] Date of Birth: August 02, 1951   Medicare Important Message Given:  Yes     Orbie Pyo 10/27/2021, 2:47 PM

## 2021-10-27 NOTE — Progress Notes (Signed)
PROGRESS NOTE    Sandra Robinson  MVE:[id number] DOB: 05-29-1951 DOA: 10/24/2021 PCP: No primary care provider on file.    Brief Narrative:  Sandra Robinson was admitted to the hospital with the working diagnosis of metabolic encephalopathy.     70 yo female with the past medical history of CVA, COPD, CKD, GERD, HTN, dyslipidemia, depression and bipolar who presented with altered mental status. Patient was noted to be herself, EMS was called and patient was brought to the hospital. On her initial physical examination patient was orientated to self but not time, her blood pressure was 161/89, hr 84,  rr 17 and oxygen saturation 98% Her lungs were clear to auscultation, heart S1 and S2 present, and rhythmic, abdomen soft and non tender, no lower extremity edema.  Neurologically not focal.    Na 134, K 2,5, Cl 98, bicarbonate 24, glucose 104, BUN 8 and serum cr at 0,89. Wbc 12,5, hgb 14,2, hct at 40,6 and Plt 304.  SARS covid 19 negative   Urine with SG 1,010 with negative nitrates, 0-5 wbc   Head CT negative for acute changes. Atrophy and chronic small vessel ischemic changes of the white matter. Multiple chronic infarcts.    Chest film positive for cardiomegaly    EKG 85 bpm, left axis deviation, qtc 509, sinus rhythm, no significant ST segment or T wave changes.   Patient was admitted to the medical ward, for close neuro checks. Her psychotropic medications were adjusted (reduced dosing) with good toleration.  Patient very weak and deconditioned will need short term rehab.   Urine analysis negative for infection, UTI was ruled out.   Assessment & Plan:   Principal Problem:   Acute encephalopathy Active Problems:   Hypokalemia   Anxiety   Bipolar affective disorder in remission (HCC)   Depression   CKD (chronic kidney disease)   COPD (chronic obstructive pulmonary disease) (HCC)   GERD (gastroesophageal reflux disease)   History of stroke   Mixed hyperlipidemia   Primary  hypertension   Metabolic encephalopathy   Malnutrition of moderate degree     Acute metabolic encephalopathy in the setting of prior CVA, bipolar, depression, and anxiety/ moderate calorie protein malnutrition. Multifactorial, patient acknowledges being severely confused at home.  Patient has been stable, tolerating po well. Continue to be very weak and deconditioned.    Continue with sertraline, buspirone, topiramate (reduce dose) and trazodone (reduce dose)  Tolerating well reduced dose of gabapentin to 100 mg po tid.    Continue with thiamine, multivitamins, and nutritional supplements.     2. Hypokalemia, hyponatremia CKD stage 2. \ Renal function with serum cr at 0,83, K is 4,3 and serum bicarbonate at 26.  Mg is 2,3 Patient is tolerating po well, no nausea or vomiting.  Continue with nutritional supplements.   3. HTN/ dyslipidemia. Discontinue HCTZ to prevent electrolyte disturbances.   Continue blood pressure control with amlodipine, systolic blood pressure has been 130 to 160 mmHg.  Continue with statin therapy and aspirin.    4. COPD. No signs of acute exacerbation  Continue with as needed albuterol and dulera.     Status is: Inpatient  Remains inpatient appropriate because: Need transfer to SNF  DVT prophylaxis: Enoxaparin   Code Status:    full  Family Communication:   No family at the bedside      Nutrition Status: Nutrition Problem: Moderate Malnutrition Etiology: chronic illness (COPD) Signs/Symptoms: moderate muscle depletion, mild fat depletion, percent weight loss Percent weight loss: 12 %  Interventions: MVI, Ensure Enlive (each supplement provides 350kcal and 20 grams of protein)   Subjective: Patient with no nausea or vomiting, no chest pain or dyspnea, tolerating po well. She is poorly  interactive but follows commands and answers to simple questions.   Objective: Vitals:   10/26/21 1448 10/26/21 2022 10/27/21 0442 10/27/21 0737  BP: 140/75 (!)  152/76 (!) 162/92 (!) 136/92  Pulse: 85 91 72 79  Resp: 17 20 20 16   Temp: 98.1 F (36.7 C) 97.8 F (36.6 C) 98.6 F (37 C) 98 F (36.7 C)  TempSrc: Oral Oral  Oral  SpO2: 94% 98% 95% 96%  Weight:        Intake/Output Summary (Last 24 hours) at 10/27/2021 0842 Last data filed at 10/26/2021 1759 Gross per 24 hour  Intake 600 ml  Output --  Net 600 ml   Filed Weights   10/26/21 1347  Weight: 70.5 kg    Examination:   General: Not in pain or dyspnea. Deconditioned  Neurology: Awake and alert, non focal, flat affect and poorly interactive, but follows commands and answers to simple questions.  E ENT: no pallor, no icterus, oral mucosa moist Cardiovascular: No JVD. S1-S2 present, rhythmic, no gallops, rubs, or murmurs. No lower extremity edema. Pulmonary: positive breath sounds bilaterally, adequate air movement, no wheezing, rhonchi or rales. Gastrointestinal. Abdomen soft and non tender Skin. No rashes Musculoskeletal: no joint deformities     Data Reviewed: I have personally reviewed following labs and imaging studies  CBC: Recent Labs  Lab 10/24/21 1442 10/24/21 1523 10/25/21 0245  WBC 12.5*  --  10.8*  NEUTROABS 7.7  --   --   HGB 14.2 13.9 13.8  HCT 40.6 41.0 41.0  MCV 84.8  --  88.0  PLT 304  --  706   Basic Metabolic Panel: Recent Labs  Lab 10/24/21 1442 10/24/21 1523 10/24/21 2016 10/24/21 2240 10/25/21 0245 10/25/21 1423 10/26/21 0248 10/27/21 0329  NA 134* 133*  --   --  132* 135 138 137  K 2.5* 2.4*  --  2.5* 2.7* 3.3* 3.7 4.3  CL 98 98  --   --  98 102 107 108  CO2 24  --   --   --  24 24 22 26   GLUCOSE 104* 107*  --   --  134* 132* 88 87  BUN 8 8  --   --  9 9 10 15   CREATININE 0.89 0.80  --   --  0.97 1.03* 0.93 0.83  CALCIUM 9.2  --   --   --  8.8* 8.7* 8.7* 8.6*  MG  --   --  1.8  --   --   --  1.7 2.3   GFR: Estimated Creatinine Clearance: 60.2 mL/min (by C-G formula based on SCr of 0.83 mg/dL). Liver Function Tests: Recent  Labs  Lab 10/24/21 1442 10/25/21 0245  AST 30 37  ALT 27 28  ALKPHOS 107 103  BILITOT 0.7 0.8  PROT 6.7 6.1*  ALBUMIN 3.5 3.1*   No results for input(s): LIPASE, AMYLASE in the last 168 hours. Recent Labs  Lab 10/24/21 1918  AMMONIA 16   Coagulation Profile: Recent Labs  Lab 10/24/21 1442  INR 1.0   Cardiac Enzymes: No results for input(s): CKTOTAL, CKMB, CKMBINDEX, TROPONINI in the last 168 hours. BNP (last 3 results) No results for input(s): PROBNP in the last 8760 hours. HbA1C: No results for input(s): HGBA1C in the last 72 hours. CBG:  No results for input(s): GLUCAP in the last 168 hours. Lipid Profile: No results for input(s): CHOL, HDL, LDLCALC, TRIG, CHOLHDL, LDLDIRECT in the last 72 hours. Thyroid Function Tests: No results for input(s): TSH, T4TOTAL, FREET4, T3FREE, THYROIDAB in the last 72 hours. Anemia Panel: No results for input(s): VITAMINB12, FOLATE, FERRITIN, TIBC, IRON, RETICCTPCT in the last 72 hours.    Radiology Studies: I have reviewed all of the imaging during this hospital visit personally     Scheduled Meds:  amLODipine  10 mg Oral Daily   aspirin EC  81 mg Oral Daily   atorvastatin  40 mg Oral QHS   busPIRone  15 mg Oral BID   enoxaparin (LOVENOX) injection  40 mg Subcutaneous Q24H   feeding supplement  237 mL Oral BID BM   gabapentin  100 mg Oral TID   mometasone-formoterol  2 puff Inhalation BID   multivitamins with iron  1 tablet Oral Daily   pantoprazole  40 mg Oral Daily   sertraline  100 mg Oral q morning   sodium chloride flush  3 mL Intravenous Once   thiamine  250 mg Oral BID   topiramate  50 mg Oral Daily   traZODone  50 mg Oral QHS   Continuous Infusions:   LOS: 2 days        Khallid Pasillas Gerome Apley, MD

## 2021-10-28 ENCOUNTER — Encounter (HOSPITAL_COMMUNITY): Payer: Self-pay | Admitting: Internal Medicine

## 2021-10-28 DIAGNOSIS — E440 Moderate protein-calorie malnutrition: Secondary | ICD-10-CM

## 2021-10-28 DIAGNOSIS — G9340 Encephalopathy, unspecified: Secondary | ICD-10-CM | POA: Diagnosis not present

## 2021-10-28 DIAGNOSIS — F419 Anxiety disorder, unspecified: Secondary | ICD-10-CM | POA: Diagnosis not present

## 2021-10-28 DIAGNOSIS — N189 Chronic kidney disease, unspecified: Secondary | ICD-10-CM | POA: Diagnosis not present

## 2021-10-28 DIAGNOSIS — F3170 Bipolar disorder, currently in remission, most recent episode unspecified: Secondary | ICD-10-CM | POA: Diagnosis not present

## 2021-10-28 NOTE — TOC Initial Note (Signed)
Transition of Care [facility]) - Initial/Assessment Note    Patient Details  Name: Sandra Robinson MRN: [id number] Date of Birth: [birth year]  [facility] CM/SW Contact:    Coralee Pesa, [place] Phone Number: 10/28/2021, 11:17 AM  Clinical Narrative:                 CSW met with pt at bedside to discuss recommendation for SNF. Pt not interested in conversation, states she does not want any rehab. CSW asked if there was anyone pt wanted called, she said know and "leave me alone". CSW noted that TOC would follow back up again with pt. TOC will continue to follow for DC planning.        Patient Goals and CMS Choice        Expected Discharge Plan and Services                                                Prior Living Arrangements/Services                       Activities of Daily Living Home Assistive Devices/Equipment: None ADL Screening (condition at time of admission) Patient's cognitive ability adequate to safely complete daily activities?: No Is the patient deaf or have difficulty hearing?: No Does the patient have difficulty seeing, even when wearing glasses/contacts?: No Does the patient have difficulty concentrating, remembering, or making decisions?: Yes Patient able to express need for assistance with ADLs?: Yes Does the patient have difficulty dressing or bathing?: Yes Independently performs ADLs?: Yes (appropriate for developmental age) Does the patient have difficulty walking or climbing stairs?: Yes Weakness of Legs: Both Weakness of Arms/Hands: Both  Permission Sought/Granted                  Emotional Assessment              Admission diagnosis:  Hypokalemia [E87.6] Altered mental status, unspecified altered mental status type [B55.97] Metabolic encephalopathy [C16.38] Patient Active Problem List   Diagnosis Date Noted   Malnutrition of moderate degree 45/36/4680   Metabolic encephalopathy 32/11/2481    Depression 10/24/2021   COPD (chronic obstructive pulmonary disease) ([place]) 10/24/2021   Anxiety 10/11/2021   CKD (chronic kidney disease) 10/11/2021   Diverticulosis 10/11/2021   GERD (gastroesophageal reflux disease) 10/11/2021   Mixed hyperlipidemia 10/11/2021   Primary hypertension 10/11/2021   Bipolar affective disorder in remission ([place]) 10/09/2021   Cerebral thrombosis with cerebral infarction 08/15/2021   Encephalopathy 08/12/2021   Acute encephalopathy 08/11/2021   Hypokalemia 08/11/2021   History of stroke 08/11/2021   Incontinence of feces 07/07/2018   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 Pharmacy:   [id number] [place] [id number]: [phone]: [phone]    Social Determinants of Health (SDOH) Interventions    Readmission Risk Interventions No flowsheet data found.

## 2021-10-28 NOTE — Progress Notes (Signed)
PROGRESS NOTE    Sandra Robinson  CVE:[id number] DOB: [birth year] DOA: 10/24/2021 PCP: No primary care provider on file.    Brief Narrative:  Sandra Robinson was admitted to the hospital with the working diagnosis of metabolic encephalopathy and hypokalemia.    70 yo female with the past medical history of CVA, COPD, CKD, GERD, HTN, dyslipidemia, depression and bipolar who presented with altered mental status. Patient was noted to be herself, EMS was called and patient was brought to the hospital. On her initial physical examination patient was orientated to self but not time, her blood pressure was 161/89, hr 84,  rr 17 and oxygen saturation 98% Her lungs were clear to auscultation, heart S1 and S2 present, and rhythmic, abdomen soft and non tender, no lower extremity edema.  Neurologically not focal.    Na 134, K 2,5, Cl 98, bicarbonate 24, glucose 104, BUN 8 and serum cr at 0,89. Wbc 12,5, hgb 14,2, hct at 40,6 and Plt 304.  SARS covid 19 negative   Urine with SG 1,010 with negative nitrates, 0-5 wbc   Head CT negative for acute changes. Atrophy and chronic small vessel ischemic changes of the white matter. Multiple chronic infarcts.    Chest film positive for cardiomegaly    EKG 85 bpm, left axis deviation, qtc 509, sinus rhythm, no significant ST segment or T wave changes.    Patient was admitted to the medical ward, for close neuro checks. Her psychotropic medications were adjusted (reduced dosing) with good toleration.  Patient very weak and deconditioned will need short term rehab.    Urine analysis negative for infection, UTI was ruled out.    Assessment & Plan:   Principal Problem:   Acute encephalopathy Active Problems:   Hypokalemia   Anxiety   Bipolar affective disorder in remission (HCC)   Depression   CKD (chronic kidney disease)   COPD (chronic obstructive pulmonary disease) (HCC)   GERD (gastroesophageal reflux disease)   History of stroke   Mixed  hyperlipidemia   Primary hypertension   Metabolic encephalopathy   Malnutrition of moderate degree     Acute metabolic encephalopathy in the setting of prior CVA, bipolar, depression, and anxiety/ moderate calorie protein malnutrition. Multifactorial, patient acknowledges being severely confused at home. Patient has no sings of delirium, no lack of attention or disorganized thinking, but positive dementia.     On sertraline, buspirone, topiramate (reduce dose) and trazodone (reduce dose)  Continue with gabapentin to 100 mg po tid. (reduced dose from home).    On thiamine, multivitamins, and nutritional supplements.     2. Hypokalemia, hyponatremia CKD stage 2. \ Patient is tolerating po well, no off IV fluids and her electrolytes have been corrected.    3. HTN/ dyslipidemia. Discontinue HCTZ to prevent electrolyte disturbances.   On amlodipine for blood pressure control.  On statin therapy and aspirin.    4. COPD. No signs of acute exacerbation  On as needed albuterol and dulera.      Status is: Inpatient  Remains inpatient appropriate because: needs SNF.    DVT prophylaxis: Enoxaparin   Code Status:    full  Family Communication:  I was not able to reach her family over the phone.     Nutrition Status: Nutrition Problem: Moderate Malnutrition Etiology: chronic illness (COPD) Signs/Symptoms: moderate muscle depletion, mild fat depletion, percent weight loss Percent weight loss: 12 % Interventions: MVI, Ensure Enlive (each supplement provides 350kcal and 20 grams of protein)    Subjective:  Patient with no nausea or vomiting, no chest pain or dyspnea, she is confused but not agitated.   Objective: Vitals:   10/27/21 1432 10/27/21 2027 10/28/21 0358 10/28/21 0828  BP: (!) 142/80 (!) 166/90 (!) 153/83   Pulse: 85 88 79   Resp: 17  18   Temp: 98.2 F (36.8 C) 98.6 F (37 C) 98 F (36.7 C)   TempSrc: Oral  Oral   SpO2: 99% 98% 98% 98%  Weight:         Intake/Output Summary (Last 24 hours) at 10/28/2021 1032 Last data filed at 10/27/2021 1806 Gross per 24 hour  Intake 480 ml  Output --  Net 480 ml   Filed Weights   10/26/21 1347  Weight: 70.5 kg    Examination:   General: deconditioned  Neurology: Awake and alert, non focal, positive disorientation x2, no lack of attention or disorganized thinking.  E ENT: mid pallor, no icterus, oral mucosa moist Cardiovascular: No JVD. S1-S2 present, rhythmic, no gallops, rubs, or murmurs. No lower extremity edema. Pulmonary: positive breath sounds bilaterally, adequate air movement, no wheezing, rhonchi or rales. Gastrointestinal. Abdomen soft and non tender Skin. No rashes Musculoskeletal: no joint deformities   Data Reviewed: I have personally reviewed following labs and imaging studies  CBC: Recent Labs  Lab 10/24/21 1442 10/24/21 1523 10/25/21 0245  WBC 12.5*  --  10.8*  NEUTROABS 7.7  --   --   HGB 14.2 13.9 13.8  HCT 40.6 41.0 41.0  MCV 84.8  --  88.0  PLT 304  --  712   Basic Metabolic Panel: Recent Labs  Lab 10/24/21 1442 10/24/21 1523 10/24/21 2016 10/24/21 2240 10/25/21 0245 10/25/21 1423 10/26/21 0248 10/27/21 0329  NA 134* 133*  --   --  132* 135 138 137  K 2.5* 2.4*  --  2.5* 2.7* 3.3* 3.7 4.3  CL 98 98  --   --  98 102 107 108  CO2 24  --   --   --  24 24 22 26   GLUCOSE 104* 107*  --   --  134* 132* 88 87  BUN 8 8  --   --  9 9 10 15   CREATININE 0.89 0.80  --   --  0.97 1.03* 0.93 0.83  CALCIUM 9.2  --   --   --  8.8* 8.7* 8.7* 8.6*  MG  --   --  1.8  --   --   --  1.7 2.3   GFR: Estimated Creatinine Clearance: 60.2 mL/min (by C-G formula based on SCr of 0.83 mg/dL). Liver Function Tests: Recent Labs  Lab 10/24/21 1442 10/25/21 0245  AST 30 37  ALT 27 28  ALKPHOS 107 103  BILITOT 0.7 0.8  PROT 6.7 6.1*  ALBUMIN 3.5 3.1*   No results for input(s): LIPASE, AMYLASE in the last 168 hours. Recent Labs  Lab 10/24/21 1918  AMMONIA 16    Coagulation Profile: Recent Labs  Lab 10/24/21 1442  INR 1.0   Cardiac Enzymes: No results for input(s): CKTOTAL, CKMB, CKMBINDEX, TROPONINI in the last 168 hours. BNP (last 3 results) No results for input(s): PROBNP in the last 8760 hours. HbA1C: No results for input(s): HGBA1C in the last 72 hours. CBG: No results for input(s): GLUCAP in the last 168 hours. Lipid Profile: No results for input(s): CHOL, HDL, LDLCALC, TRIG, CHOLHDL, LDLDIRECT in the last 72 hours. Thyroid Function Tests: No results for input(s): TSH, T4TOTAL, FREET4, T3FREE, THYROIDAB  in the last 72 hours. Anemia Panel: No results for input(s): VITAMINB12, FOLATE, FERRITIN, TIBC, IRON, RETICCTPCT in the last 72 hours.    Radiology Studies: I have reviewed all of the imaging during this hospital visit personally     Scheduled Meds:  amLODipine  10 mg Oral Daily   aspirin EC  81 mg Oral Daily   atorvastatin  40 mg Oral QHS   busPIRone  15 mg Oral BID   enoxaparin (LOVENOX) injection  40 mg Subcutaneous Q24H   feeding supplement  237 mL Oral BID BM   gabapentin  100 mg Oral TID   mometasone-formoterol  2 puff Inhalation BID   multivitamins with iron  1 tablet Oral Daily   pantoprazole  40 mg Oral Daily   sertraline  100 mg Oral q morning   sodium chloride flush  3 mL Intravenous Once   thiamine  250 mg Oral BID   topiramate  50 mg Oral Daily   traZODone  50 mg Oral QHS   Continuous Infusions:   LOS: 3 days        Niang Mitcheltree Gerome Apley, MD

## 2021-10-28 NOTE — Progress Notes (Signed)
Physical Therapy Treatment Patient Details Name: Sandra Robinson MRN: [id number] DOB: 09-Sep-1951 Today's Date: 10/28/2021   History of Present Illness Pt is pleasantly confused 70 y/o F admitted 10/24/21 with acute encephalopathy after having AMS at home. PMH includes CVA, anxiety, bipolar, depression, COPD, CKD, GERD, diverticulosis, hyperlipidemia, hypertension.    PT Comments    Pt slower progressing toward goals with L side weakness still present.  Emphasis on transitions, sit to stand safety/technique, progression of gait stability/quality and stamina, presently having to assist advancing L LE more than not.    Recommendations for follow up therapy are one component of a multi-disciplinary discharge planning process, led by the attending physician.  Recommendations may be updated based on patient status, additional functional criteria and insurance authorization.  Follow Up Recommendations  Skilled nursing-short term rehab (<3 hours/day)     Assistance Recommended at Discharge Intermittent Supervision/Assistance  Equipment Recommendations  Other (comment)    Recommendations for Other Services       Precautions / Restrictions Precautions Precautions: Fall     Mobility  Bed Mobility               General bed mobility comments: In the chair on arrival    Transfers Overall transfer level: Needs assistance   Transfers: Sit to/from Stand Sit to Stand: Min assist;Mod assist           General transfer comment: cues for hand placement, assist to come forward and then boost.    Ambulation/Gait Ambulation/Gait assistance: Mod assist Gait Distance (Feet): 30 Feet Assistive device: Rolling walker (2 wheels) Gait Pattern/deviations: Step-to pattern;Step-through pattern;Decreased step length - right;Decreased step length - left;Decreased stance time - left;Decreased stride length Gait velocity: slower Gait velocity interpretation: <1.31 ft/sec, indicative of  household ambulator   General Gait Details: Pt with paretic-like gait, Needed assist in advancing L LE on occasion as she fatigued.  Also needing control of RW, assist with w/shift.   Stairs             Wheelchair Mobility    Modified Rankin (Stroke Patients Only)       Balance Overall balance assessment: Needs assistance Sitting-balance support: Feet supported Sitting balance-Leahy Scale: Fair       Standing balance-Leahy Scale: Poor Standing balance comment: reliant on external support                            Cognition Arousal/Alertness: Awake/alert Behavior During Therapy: Flat affect Overall Cognitive Status: Impaired/Different from baseline Area of Impairment: Orientation;Attention;Memory;Following commands;Awareness                 Orientation Level: Time Current Attention Level: Selective Memory: Decreased short-term memory Following Commands: Follows one step commands inconsistently   Awareness: Emergent   General Comments: Pt disoriented and appears confused. Thinks year is 10 and "can't believe it" when told it is 2022. Also states that she was playing baseball yesterday (shortstop) which seems unlikely. Slow processing and decreased initiation.        Exercises      General Comments        Pertinent Vitals/Pain Pain Assessment: No/denies pain    Home Living                          Prior Function            PT Goals (current goals can now be found in the care  plan section) Acute Rehab PT Goals PT Goal Formulation: Patient unable to participate in goal setting Time For Goal Achievement: 11/09/21 Potential to Achieve Goals: Fair Progress towards PT goals: Progressing toward goals    Frequency    Min 3X/week      PT Plan Current plan remains appropriate    Co-evaluation              AM-PAC PT "6 Clicks" Mobility   Outcome Measure  Help needed turning from your back to your side while  in a flat bed without using bedrails?: A Little Help needed moving from lying on your back to sitting on the side of a flat bed without using bedrails?: A Little Help needed moving to and from a bed to a chair (including a wheelchair)?: A Lot Help needed standing up from a chair using your arms (e.g., wheelchair or bedside chair)?: A Lot Help needed to walk in hospital room?: A Lot   6 Click Score: 12    End of Session   Activity Tolerance: Patient tolerated treatment well;Patient limited by fatigue Patient left: in chair;with call bell/phone within reach;with chair alarm set Nurse Communication: Mobility status PT Visit Diagnosis: Unsteadiness on feet (R26.81);Other abnormalities of gait and mobility (R26.89);Muscle weakness (generalized) (M62.81);Other symptoms and signs involving the nervous system (R29.898)     Time: 3762-8315 PT Time Calculation (min) (ACUTE ONLY): 22 min  Charges:  $Gait Training: 8-22 mins                     10/28/2021  Ginger Carne., PT Acute Rehabilitation Services [phone]  (pager) [phone]  (office)   Tessie Fass Braheem Tomasik 10/28/2021, 5:58 PM

## 2021-10-28 NOTE — Progress Notes (Signed)
OT Cancellation Note  Patient Details Name: Sandra Robinson MRN: [id number] DOB: 07-10-1951   Cancelled Treatment:    Reason Eval/Treat Not Completed: Patient declined, no reason specified (Pt stating "leave me alone". mutiple attempts to education on benefits of OOB. Pt repeating "leave me alone". Pt nodding her head yes to OOB later today. Will returne as schedule allows.)  [place], OTR/L Acute Rehab Pager: [phone] Office: [phone] 10/28/2021, 10:20 AM

## 2021-10-28 NOTE — Plan of Care (Signed)
  Problem: Activity: Goal: Risk for activity intolerance will decrease Outcome: Progressing   Problem: Nutrition: Goal: Adequate nutrition will be maintained Outcome: Progressing   Problem: Coping: Goal: Level of anxiety will decrease Outcome: Progressing   Problem: Elimination: Goal: Will not experience complications related to bowel motility Outcome: Progressing   Problem: Safety: Goal: Ability to remain free from injury will improve Outcome: Progressing   

## 2021-10-29 ENCOUNTER — Inpatient Hospital Stay (HOSPITAL_COMMUNITY): Payer: Medicare HMO

## 2021-10-29 DIAGNOSIS — F0390 Unspecified dementia without behavioral disturbance: Secondary | ICD-10-CM | POA: Diagnosis not present

## 2021-10-29 DIAGNOSIS — F3170 Bipolar disorder, currently in remission, most recent episode unspecified: Secondary | ICD-10-CM | POA: Diagnosis not present

## 2021-10-29 DIAGNOSIS — J449 Chronic obstructive pulmonary disease, unspecified: Secondary | ICD-10-CM | POA: Diagnosis not present

## 2021-10-29 DIAGNOSIS — R9431 Abnormal electrocardiogram [ECG] [EKG]: Secondary | ICD-10-CM

## 2021-10-29 DIAGNOSIS — G929 Unspecified toxic encephalopathy: Secondary | ICD-10-CM

## 2021-10-29 DIAGNOSIS — Z95818 Presence of other cardiac implants and grafts: Secondary | ICD-10-CM

## 2021-10-29 DIAGNOSIS — R627 Adult failure to thrive: Secondary | ICD-10-CM

## 2021-10-29 DIAGNOSIS — R531 Weakness: Secondary | ICD-10-CM

## 2021-10-29 IMAGING — MR MR HEAD W/O CM
5 series · 47 of 48 positions shown · non-contrast
Comparison: Prior CT from [DATE].

CLINICAL DATA: Follow-up examination for acute stroke, left-sided
weakness.

EXAM:
MRI HEAD WITHOUT CONTRAST
TECHNIQUE: Multiplanar, multiecho pulse sequences of the brain and surrounding
structures were obtained without intravenous contrast.

[Series 2: DWI · axial · 3.0mm · 0.94mm/px · z∈[-83,+68]mm · 17 of 103 slices shown (1 of 2)]
[im 1/103]
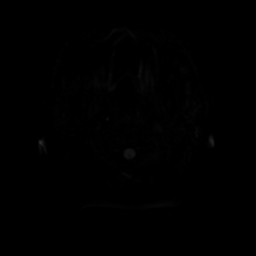
[im 7/103]
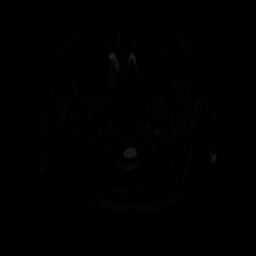
[im 13/103]
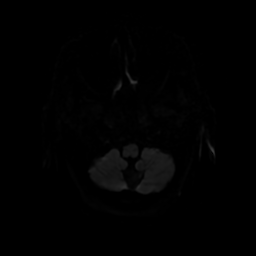
[im 20/103]
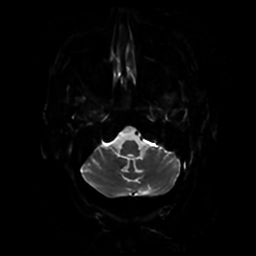
[im 26/103]
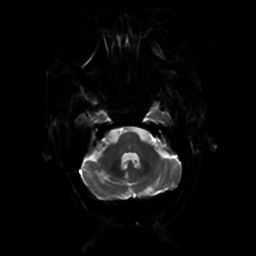
[im 32/103]
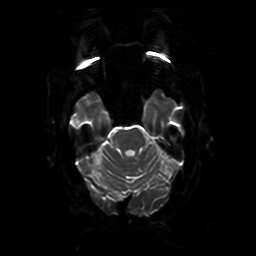
[im 39/103]
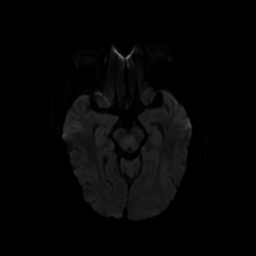
[im 45/103]
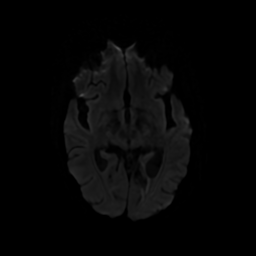
[im 52/103]
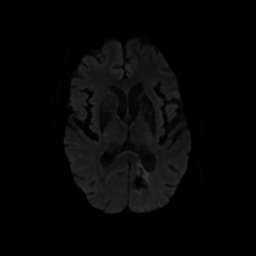
[im 58/103]
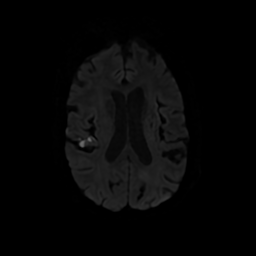
[im 64/103]
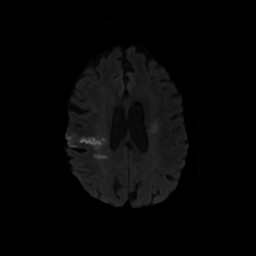
[im 71/103]
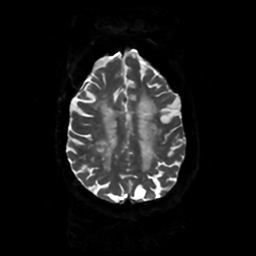
[im 77/103]
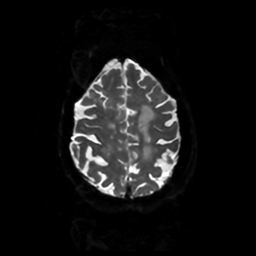
[im 83/103]
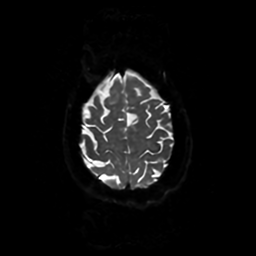
[im 90/103]
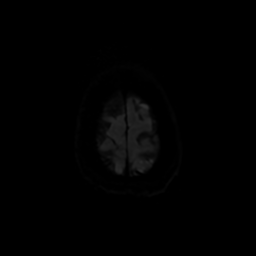
[im 96/103]
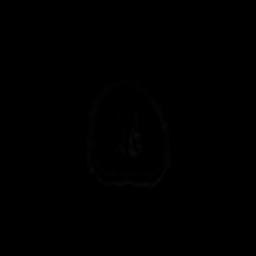
[im 103/103]
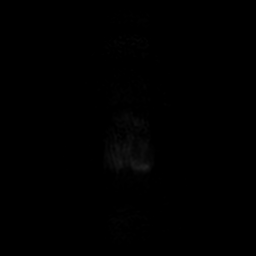

[Series 3: DWI · coronal · 4.0mm · 0.94mm/px · 11 of 77 slices shown (2 of 2)]
[im 1/77]
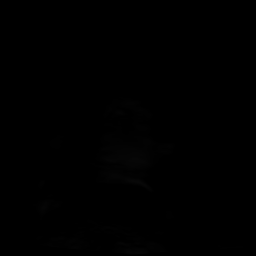
[im 7/77]
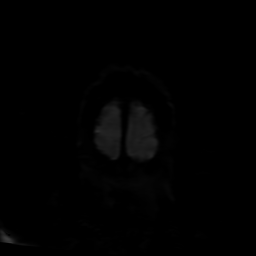
[im 14/77]
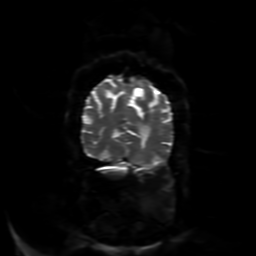
[im 21/77]
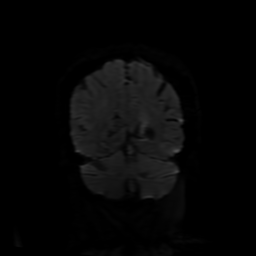
[im 28/77]
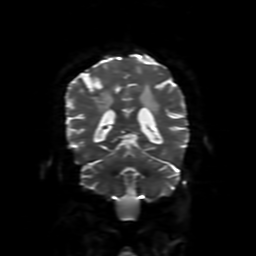
[im 35/77]
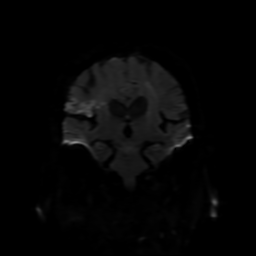
[im 42/77]
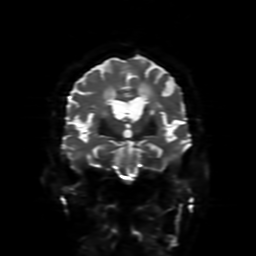
[im 56/77]
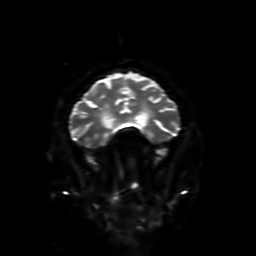
[im 63/77]
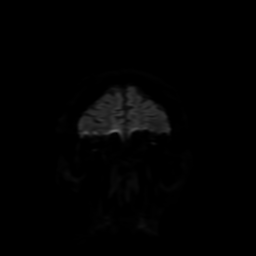
[im 70/77]
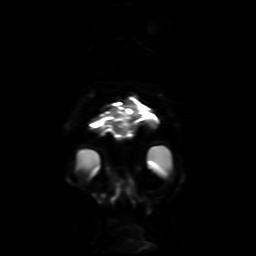
[im 77/77]
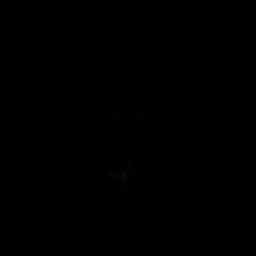

[Series 4: FLAIR · axial · 4.0mm · 0.45mm/px · z∈[-81,+67]mm · 5 of 35 slices shown]
[im 1/35]
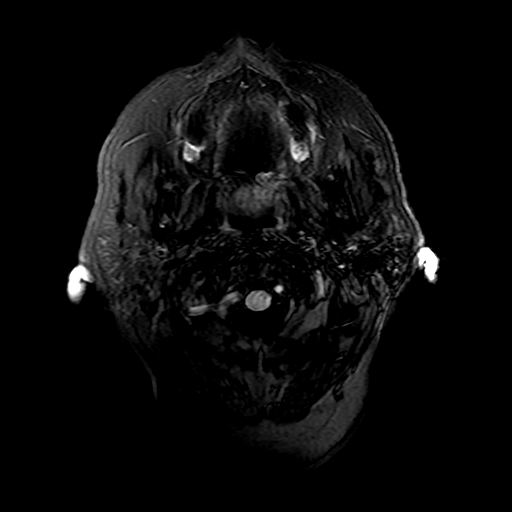
[im 9/35]
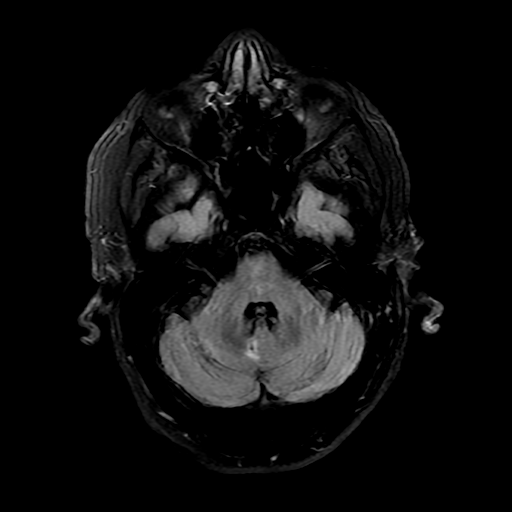
[im 18/35]
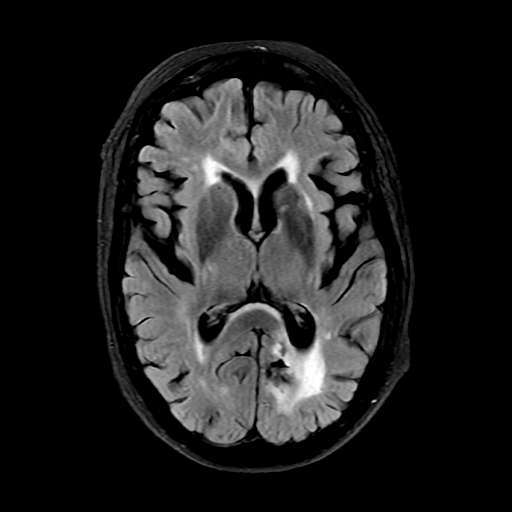
[im 26/35]
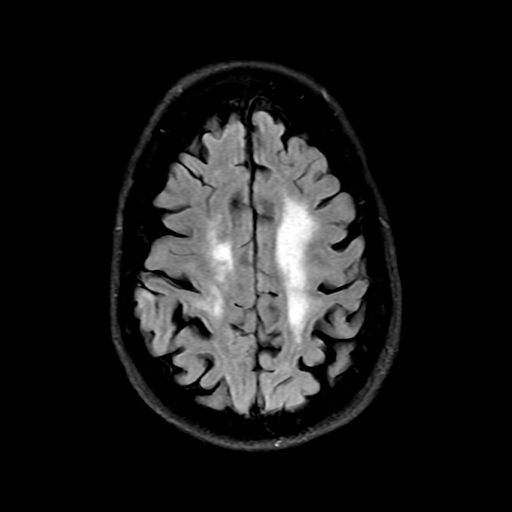
[im 35/35]
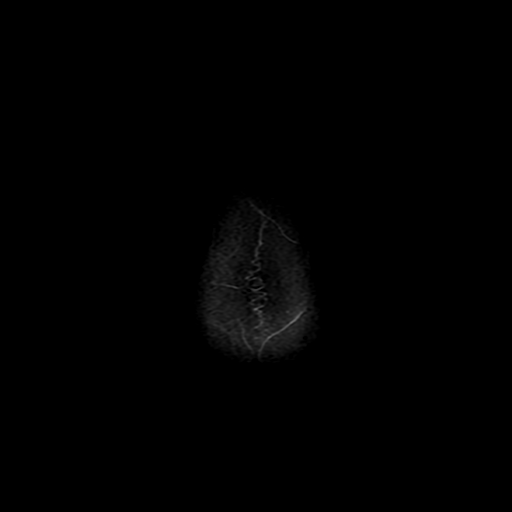

[Series 250: ADC · axial · 3.0mm · 0.94mm/px · z∈[-83,+68]mm · 8 of 52 slices shown (1 of 2)]
[im 1/52]
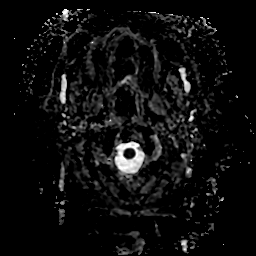
[im 8/52]
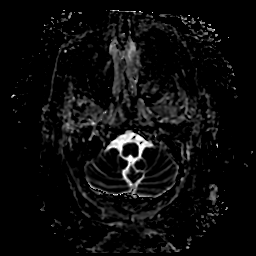
[im 15/52]
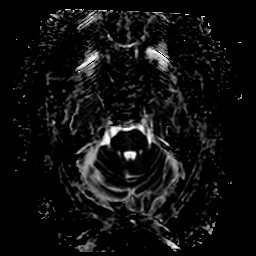
[im 22/52]
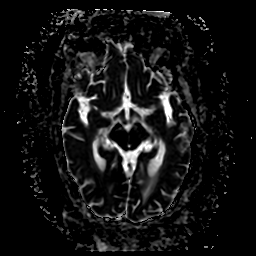
[im 30/52]
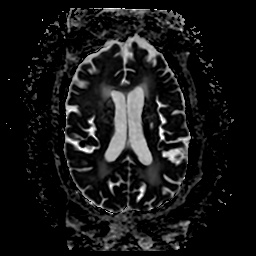
[im 37/52]
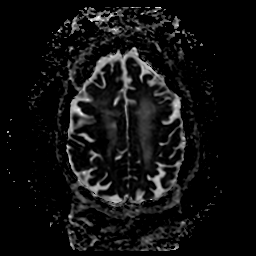
[im 44/52]
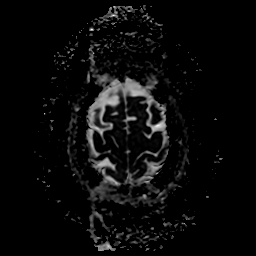
[im 52/52]
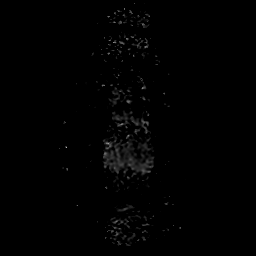

[Series 350: ADC · coronal · 4.0mm · 0.94mm/px · 6 of 39 slices shown (2 of 2)]
[im 1/39]
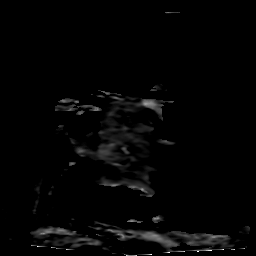
[im 8/39]
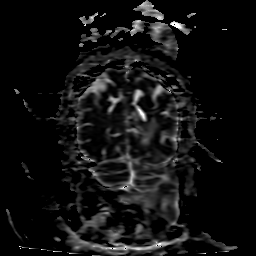
[im 16/39]
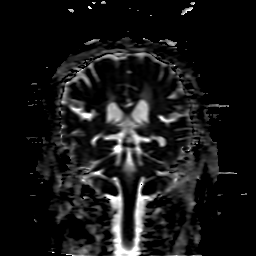
[im 23/39]
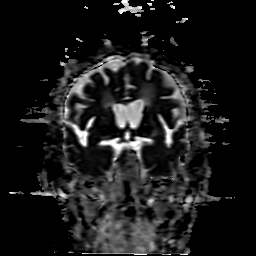
[im 31/39]
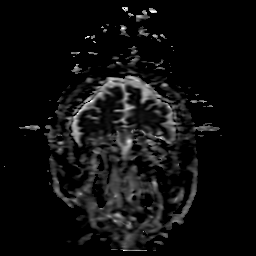
[im 39/39]
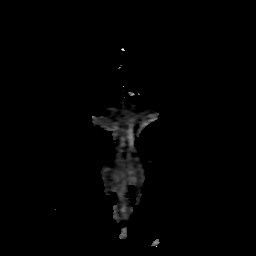

[47 of 48 positions shown; findings below may reference images not displayed]

FINDINGS: Brain: Examination is technically limited as the patient was unable
to tolerate the full length of the study. Diffusion-weighted
sequences and axial FLAIR sequence only were performed.

Cerebral volume within normal limits for age. Patchy and confluent
T2/FLAIR hyperintensity involving the periventricular deep white
matter both cerebral hemispheres most consistent with chronic small
vessel ischemic disease, moderately advanced in nature. Few
scattered remote lacunar infarcts present about the bilateral basal
ganglia and left corona radiata. Additional remote lacunar infarcts
present at the right pons and mid cerebellum. Few additional
scattered remote cerebellar infarcts noted peripherally.
Encephalomalacia and gliosis involving the parasagittal left
occipital lobe with mild residual diffusion signal, likely an
evolving late subacute to chronic left PCA distribution infarct.

Scattered patchy foci of restricted diffusion involving the cortical
and subcortical aspect of the posterior right frontoparietal region,
consistent with an acute ischemic infarct, right MCA distribution
(series 2, image 34). No significant mass effect. No obvious signal
changes to suggest associated hemorrhage on this limited exam.
Probable additional small acute to early subacute infarct involving
the posterior limb of the right internal capsule noted (series 2,
image 26).

Otherwise, gray-white matter differentiation grossly maintained. 14
mm meningioma noted at the left parietal convexity without mass
effect. No other mass lesion, mass effect, or midline shift. No
hydrocephalus or extra-axial fluid collection.

Vascular: Abnormal flow void within the proximal left V4 segment,
likely reflecting slow flow given findings on prior CTA from
[DATE]. Major intracranial vascular flow voids are otherwise
maintained.

Skull and upper cervical spine: Craniocervical junction not well
assessed on this limited study. No visible abnormality about the
scalp or calvarium.

Sinuses/Orbits: Globes and orbital soft tissues demonstrate no acute
finding. Paranasal sinuses are largely clear. Trace left greater
than right mastoid effusions noted

Other: None.
IMPRESSION: 1. Technically limited exam due to the patient's inability to
tolerate the full length of the study. DWI and FLAIR sequences only
were performed.
2. Scattered acute ischemic infarcts involving the posterior right
frontoparietal region, right MCA distribution. No significant mass
effect.
3. Probable additional subcentimeter acute to early subacute infarct
involving the posterior limb of the right internal capsule.
4. Underlying moderate chronic microvascular ischemic disease with
multiple additional chronic ischemic changes as above.
5. 14 mm meningioma at the left parietal convexity without mass
effect.

## 2021-10-29 NOTE — Assessment & Plan Note (Addendum)
--   Remains calm, will continue BuSpar, Zoloft, trazodone

## 2021-10-29 NOTE — Assessment & Plan Note (Addendum)
--   Per grandson this is longstanding and patient has been on medication for many years, apparently not taking this medication appropriately in the outpatient setting or at least questionable.  Seen by psychiatry, not felt to have signs or symptoms of decompensated bipolar disorder.  Recommendation was to continue gabapentin with up titration as indicated, Buspar, trazodone, Zoloft.

## 2021-10-29 NOTE — Assessment & Plan Note (Deleted)
--   Noted in physical therapy notes, and review of hospitalizations from August and October including neurology notes and therapy notes when available, I do not see's documentation of left-sided weakness.  Patient can provide no history.  MRI in August did show multiple chronic infarcts but per family patient has been ambulatory at home. -- Left-sided weakness persistent for several days at least, suspect present on admission, will check MRI to rule out subactue event, may be multifactorial in nature or represent subacute stroke.  Has a loop recorder present, per MRI is MRI compatible.  Discussed with son who consented to MRI, no other hardware or metal in place known.  Shoulder replacement on the right.

## 2021-10-29 NOTE — Assessment & Plan Note (Signed)
--   With significant weight loss per family, follow-up as an outpatient

## 2021-10-29 NOTE — Plan of Care (Signed)

## 2021-10-29 NOTE — Hospital Course (Addendum)
70 year old woman PMH stroke August, COPD, CKD, bipolar disorder, depression presented with altered mental status --11/7 admitted for altered mental status, consideration given to polypharmacy, hypokalemia.  BuSpar, sertraline, trazodone held. --11/8 sertraline, buspirone, topiramate, trazodone resumed, somewhat decreased doses including gabapentin.  Urinalysis negative, antibiotics discontinued. -- 11/9 confusion improving, weak, deconditioned, left side weaker than right per PT -- 11/10 stabilizing, rec for SNF -- 11/11 stabilizing rec for SNF -- 11/12 chart reviewed, remains confused; per grandson pt has been confused, not taking her mental health medications, has been losing weight, has been wandering around neighborhood; per son pt has been confused for several months; no family able to take care of her -- 11/13 MRI revealed multiple acute infarcts.  Neurology consultation pending.  Plan for SNF.  Loop recorder interrogation unremarkable.  EEG unremarkable thus far. -- 11/14 plan for TEE 11/16.  Unwitnessed fall without apparent injury.  Started on fall precautions, bed alarm, telemetry sitter.  Plan for SNF eventually. --11/15 TEE planned 11/16.  Anticipate stability for transfer to SNF thereafter.  Mentation improved today.

## 2021-10-29 NOTE — Assessment & Plan Note (Addendum)
--   Probable diagnosis at this point, history from family with months of confusion, wandering behavior, no longer felt to be safe to drive a car per grandson.  Patient has been living at home, apparently performing own ADLs and executive functions, however neither grandson nor son can confirm the actual state of things at home

## 2021-10-29 NOTE — Progress Notes (Signed)
Pt brought down to MRI for exam. Pt safety screened verbally with daughter prior to exam. Pt prepared for exam and began scanning. Halfway through exam pt stated, "Get me out, I need to come out". Removed pt from scanner. Pt stated they could not continue and needed to "get out of this thing, I don't like these tests". Pt advised of time remaining. Pt stated they can't do it. Pt refused to continue. Able to obtain limited study (AX DWI, COR DWI, AX T2 FLAIR). Pt removed from scanner room, transferred back to bed and sent back to room via pt transport.

## 2021-10-29 NOTE — Assessment & Plan Note (Signed)
--   Appears stable, continue bronchodilators

## 2021-10-29 NOTE — Progress Notes (Signed)
Progress Note RENN STILLE   DUK:[id number]  DOB: 31-Jul-1951  DOA: 10/24/2021     4 Date of Service: 10/29/2021   Clinical Course 70 year old woman PMH stroke August, COPD, CKD, bipolar disorder, depression presented with altered mental status --11/7 admitted for altered mental status, consideration given to polypharmacy, hypokalemia.  BuSpar, sertraline, trazodone held. --11/8 sertraline, buspirone, topiramate, trazodone resumed, somewhat decreased doses including gabapentin.  Urinalysis negative, antibiotics discontinued. -- 11/9 confusion improving, weak, deconditioned, left side weaker than right per PT -- 11/10 stabilizing, rec for SNF -- 11/11 stabilizing rec for SNF -- 11/12 chart reviewed, remains confused; per grandson pt has been confused, not taking her mental health medications, has been losing weight, has been wandering around neighborhood; per son pt has been confused for several months; no family able to take care of her  Assessment and Plan * Toxic encephalopathy -- Chart reviewed in detail, care everywhere, discussion with grandson and son, patient's has had confusion for several months and this is noted both on admission in August and recent admission in October.  Recent admission in October psychotropic medications were decreased as it was thought the patient might be noncompliant or medication doses were too high secondary to weight loss.  Grandson suspect patient is not taking medications correctly. -- Here the patient has been started on her medications at reduced dose.  Grandson feels like the problem is probably lack of treatment with her chronic psychotropics if she has been on these for more than 30 years in some fashion. -- Therefore at this point is not clear how much of this is acute or even indeed related to her medications, it is possible her confusion is actually related to bipolar disease and other mental illness. -- We will consult psychiatry, but given  history, will continue psychotropics pending evaluation.  Dementia without behavioral disturbance (HCC) -- Possible diagnosis at this point, history from family with months of confusion, wandering behavior, no longer felt to be safe to drive a car per grandson.  Patient has been living at home, apparently performing own ADLs and executive functions, however neither grandson nor son can confirm the actual state of things at home -- It is possible mental illness incompletely treated is causing this behavior, psychiatry consultation as above, follow-up with [facility] as an outpatient  Bipolar affective disorder in remission [facility]) -- Per grandson this is longstanding and patient has been on medication for many years, apparently not taking this medication appropriately in the outpatient setting or at least questionable.  Grandson wonders if mentation will improve with consistent medication and requested psychiatry consultation for assessment of current state and recommendation in regard to medications -- Perhaps consistent treatment would improve mentation  Left-sided weakness -- Noted in physical therapy notes, and review of hospitalizations from August and October including neurology notes and therapy notes when available, I do not see's documentation of left-sided weakness.  Patient can provide no history.  MRI in August did show multiple chronic infarcts but per family patient has been ambulatory at home. -- Left-sided weakness persistent for several days at least, suspect present on admission, will check MRI to rule out subactue event, may be multifactorial in nature or represent subacute stroke.  Has a loop recorder present, per MRI is MRI compatible.  Discussed with son who consented to MRI, no other hardware or metal in place known.  Shoulder replacement on the right.  History of stroke -- August of this year, also multiple infarcts seen on imaging -- I  do not see any notation of patient's deficits  from August, review of therapy notes does not specify focal weakness  COPD (chronic obstructive pulmonary disease) (HCC) -- Appears stable, continue bronchodilators  FTT -- With significant weight loss per family, follow-up as an outpatient  Prolonged QT interval -- Recheck EKG today and monitor given multiple psychotropics  Malnutrition of moderate degree -- With associated failure to thrive and weight loss at home --Continue Multivitamin w/ minerals daily, Ensure Enlive po BID   Mixed hyperlipidemia -- Continue statin  Depression -- Flat affect, continue BuSpar, Zoloft, trazodone pending psychiatry consultation  Anxiety -- Currently calm, will continue BuSpar, Zoloft, trazodone pending psychiatry evaluation  Implantable loop recorder present Medtronic Reveal LINQ implantable loop recorder, per MRI is safe for MRI    Subjective:  Feels okay, "bored", no complaints noted.  No family present.  Objective Vitals:   10/28/21 0828 10/28/21 1251 10/29/21 0533 10/29/21 0750  BP:  139/78 (!) 159/80 (!) 151/71  Pulse:  95 79 86  Resp:  17 17 16   Temp:  97.9 F (36.6 C) 98.4 F (36.9 C) 98.3 F (36.8 C)  TempSrc:  Oral  Oral  SpO2: 98% 98% 97% 95%  Weight:       70.5 kg  Vital signs were reviewed and unremarkable.   Exam Physical Exam Vitals reviewed.  Constitutional:      General: She is not in acute distress.    Appearance: She is not ill-appearing or toxic-appearing.  Cardiovascular:     Rate and Rhythm: Normal rate and regular rhythm.     Heart sounds: No murmur heard. Pulmonary:     Effort: Pulmonary effort is normal. No respiratory distress.     Breath sounds: No wheezing, rhonchi or rales.  Musculoskeletal:     Right lower leg: No edema.     Left lower leg: No edema.  Neurological:     Mental Status: She is alert. She is disoriented.     Comments: Left arm and leg appear weaker than the right.  Poor coordination left arm greater than right arm.  Thinks  she is in [place], Marijean Bravo is president, [birth year], 1968  Psychiatric:     Comments: Sffect flat, difficult to gauge mood.    Labs / Other Information My review of labs, imaging, notes and other tests is significant for unremarkable basic metabolic panel, normal magnesium    Disposition Plan: Status is: Inpatient  Remains inpatient appropriate because: Not safe to discharge back home, left-sided weakness of unknown duration, rule out subacute stroke, psychiatric evaluation to assess for treatable mental illness to improve cognition, eventually SNF  Enoxaparin Discussion with grandson, son by telephone  Time spent: 50 minutes counseling coordination of care, review of chart [facility] 10/29/2021, 12:56 PM

## 2021-10-29 NOTE — Assessment & Plan Note (Addendum)
--   Continue statin, but at lower dose given low LDL

## 2021-10-29 NOTE — Assessment & Plan Note (Signed)
--   With associated failure to thrive and weight loss at home --Continue Multivitamin w/ minerals daily, Ensure Enlive po BID

## 2021-10-29 NOTE — Assessment & Plan Note (Deleted)
--   August of this year, also multiple infarcts seen on imaging -- I do not see any notation of patient's deficits from August, review of therapy notes does not specify focal weakness

## 2021-10-29 NOTE — Assessment & Plan Note (Addendum)
--   See previous notes for details, but in brief concern was given to polypharmacy accidental misuse of medications, or noncompliance with psychotropics.  However, during the course of the hospitalization, no particular inciting event or likely etiology was discovered in regard to medications.  Evaluated by psychiatry, and medications thought to be noncontributory.  After further thought on this, as well as history from family, the patient's been confused for some time with wandering behavior.  Confusion was also documented on hospitalization back in August. -- At this point cannot rule out a toxic encephalopathy as contributing to presentation, however suspect more likely acute stroke, complicated by suspected dementia -- More aware and alert today.

## 2021-10-29 NOTE — Assessment & Plan Note (Signed)
--   Recheck EKG today and monitor given multiple psychotropics

## 2021-10-29 NOTE — Assessment & Plan Note (Addendum)
--   Flat affect, continue BuSpar, Zoloft, trazodone

## 2021-10-30 ENCOUNTER — Inpatient Hospital Stay (HOSPITAL_COMMUNITY): Payer: Medicare HMO

## 2021-10-30 DIAGNOSIS — I639 Cerebral infarction, unspecified: Secondary | ICD-10-CM | POA: Diagnosis not present

## 2021-10-30 DIAGNOSIS — G929 Unspecified toxic encephalopathy: Secondary | ICD-10-CM | POA: Diagnosis not present

## 2021-10-30 DIAGNOSIS — I6389 Other cerebral infarction: Secondary | ICD-10-CM | POA: Diagnosis not present

## 2021-10-30 DIAGNOSIS — F3170 Bipolar disorder, currently in remission, most recent episode unspecified: Secondary | ICD-10-CM | POA: Diagnosis not present

## 2021-10-30 DIAGNOSIS — F0390 Unspecified dementia without behavioral disturbance: Secondary | ICD-10-CM | POA: Diagnosis not present

## 2021-10-30 LAB — COMPREHENSIVE METABOLIC PANEL
ALT: 59 U/L — ABNORMAL HIGH (ref 0–44)
AST: 77 U/L — ABNORMAL HIGH (ref 15–41)
Albumin: 3.5 g/dL (ref 3.5–5.0)
Alkaline Phosphatase: 106 U/L (ref 38–126)
Anion gap: 12 (ref 5–15)
BUN: 22 mg/dL (ref 8–23)
CO2: 21 mmol/L — ABNORMAL LOW (ref 22–32)
Calcium: 9.5 mg/dL (ref 8.9–10.3)
Chloride: 105 mmol/L (ref 98–111)
Creatinine, Ser: 1.04 mg/dL — ABNORMAL HIGH (ref 0.44–1.00)
GFR, Estimated: 58 mL/min — ABNORMAL LOW (ref >60)
Glucose, Bld: 158 mg/dL — ABNORMAL HIGH (ref 70–99)
Potassium: 4.7 mmol/L (ref 3.5–5.1)
Sodium: 138 mmol/L (ref 135–145)
Total Bilirubin: 0.6 mg/dL (ref 0.3–1.2)
Total Protein: 6.9 g/dL (ref 6.5–8.1)

## 2021-10-30 LAB — ECHOCARDIOGRAM COMPLETE BUBBLE STUDY
AR max vel: 1.91 cm2
AV Area VTI: 1.98 cm2
AV Area mean vel: 1.82 cm2
AV Mean grad: 5 mmHg
AV Peak grad: 9.5 mmHg
Ao pk vel: 1.54 m/s
Area-P 1/2: 3.72 cm2
MV VTI: 1.64 cm2

## 2021-10-30 LAB — CBC WITH DIFFERENTIAL/PLATELET
Abs Immature Granulocytes: 0.09 10*3/uL — ABNORMAL HIGH (ref 0.00–0.07)
Basophils Absolute: 0.1 10*3/uL (ref 0.0–0.1)
Basophils Relative: 1 %
Eosinophils Absolute: 0.1 10*3/uL (ref 0.0–0.5)
Eosinophils Relative: 2 %
HCT: 42 % (ref 36.0–46.0)
Hemoglobin: 13.7 g/dL (ref 12.0–15.0)
Immature Granulocytes: 1 %
Lymphocytes Relative: 27 %
Lymphs Abs: 2.4 10*3/uL (ref 0.7–4.0)
MCH: 29.3 pg (ref 26.0–34.0)
MCHC: 32.6 g/dL (ref 30.0–36.0)
MCV: 89.7 fL (ref 80.0–100.0)
Monocytes Absolute: 0.7 10*3/uL (ref 0.1–1.0)
Monocytes Relative: 8 %
Neutro Abs: 5.6 10*3/uL (ref 1.7–7.7)
Neutrophils Relative %: 61 %
Platelets: 298 10*3/uL (ref 150–400)
RBC: 4.68 MIL/uL (ref 3.87–5.11)
RDW: 14.7 % (ref 11.5–15.5)
WBC: 9 10*3/uL (ref 4.0–10.5)
nRBC: 0 % (ref 0.0–0.2)

## 2021-10-30 LAB — VITAMIN B12: Vitamin B-12: 206 pg/mL (ref 180–914)

## 2021-10-30 LAB — SEDIMENTATION RATE: Sed Rate: 25 mm/hr — ABNORMAL HIGH (ref 0–22)

## 2021-10-30 LAB — C-REACTIVE PROTEIN: CRP: 0.5 mg/dL (ref <1.0)

## 2021-10-30 LAB — TSH: TSH: 3.375 u[IU]/mL (ref 0.350–4.500)

## 2021-10-30 NOTE — Progress Notes (Signed)
Loop recorder interrogation:   The patient has not been able to connect or transmit remotely despite multiple calls from our office.  She also missed her wound check.  I attempted to assist with setting up her smart phone today however her phone battery is dead.  She may require a bedside monitor.  I will ask medtronic to see on Monday if she is still here.   Interrogation today reveals two episodes on 10/10/21.  These appear to be primarily sinus with PACs, nonsustained atach, and disorganized atrial activity.  I am not convinced that this is true sustained afib and would therefore not initiate anticoagulation.  She has not had any events detected since 10/10/21.    Rec: Continue to monitor with loop recorder post discharge. I will ask medtronic to assist with remote connection but worry about her ability to comply with remote monitoring.  Thompson Grayer MD, [place] 10/30/2021 10:27 AM

## 2021-10-30 NOTE — NC FL2 (Signed)
[place] LEVEL OF CARE SCREENING TOOL     IDENTIFICATION  Patient Name: Sandra Robinson Birthdate: 08-01-1951 Sex: female Admission Date (Current Location): 10/24/2021  [facility] and [place] Number:  Herbalist and Address:  [facility] [place], [place] [address], [place], [place] [address]      Provider Number: [id number]  Attending Physician Name and Address:  Samuella Cota, MD  Relative Name and Phone Number:  [name] [phone]    Current Level of Care: Hospital Recommended Level of Care: [place] Prior Approval Number:    Date Approved/Denied:   PASRR Number: [id number]  Discharge Plan: SNF    Current Diagnoses: Patient Active Problem List   Diagnosis Date Noted   Dementia without behavioral disturbance ([place]) 10/29/2021   FTT 10/29/2021   Left-sided weakness 10/29/2021   Implantable loop recorder present 10/29/2021   Malnutrition of moderate degree 10/27/2021   Depression 10/24/2021   COPD (chronic obstructive pulmonary disease) ([place]) 10/24/2021   Anxiety 10/11/2021   CKD (chronic kidney disease) 10/11/2021   Diverticulosis 10/11/2021   GERD (gastroesophageal reflux disease) 10/11/2021   Mixed hyperlipidemia 10/11/2021   Primary hypertension 10/11/2021   Bipolar affective disorder in remission ([place]) 10/09/2021   Cerebral thrombosis with cerebral infarction 08/15/2021   Encephalopathy 08/12/2021   Toxic encephalopathy 08/11/2021   Hypokalemia 08/11/2021   History of stroke 08/11/2021   Incontinence of feces 07/07/2018   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    Orientation RESPIRATION BLADDER Height & Weight     Self, Situation  Normal Incontinent Weight: 155 lb 6.8 oz (70.5 kg) Height:     BEHAVIORAL SYMPTOMS/MOOD NEUROLOGICAL BOWEL NUTRITION STATUS      Continent Diet (Please see discharge summary)  AMBULATORY STATUS COMMUNICATION OF NEEDS Skin   Limited Assist Verbally  Normal (WDL)                       Personal Care Assistance Level of Assistance  Bathing, Feeding, Dressing Bathing Assistance: Limited assistance Feeding assistance: Independent (able to feed self) Dressing Assistance: Limited assistance     Functional Limitations Info  Sight, Hearing, Speech Sight Info: Adequate (WDL) Hearing Info: Adequate (WDL) Speech Info: Adequate (WDL)    [place]  PT (By licensed PT), OT (By licensed OT)     PT Frequency: 5x min weekly OT Frequency: 5x min weekly            Contractures Contractures Info: Not present    Additional Factors Info  Code Status, Allergies, Psychotropic Code Status Info: FULL Allergies Info: Latex,Morphine,Morphine And Related,Other,Oxycodone,Tape,Tape Psychotropic Info: busPIRone (BUSPAR) tablet 15 mg 2 times daily,sertraline (ZOLOFT) tablet 100 mg every morning,traZODone (DESYREL) tablet 50 mg daily at bedtime         Current Medications (10/30/2021):  This is the current hospital active medication list Current Facility-Administered Medications  Medication Dose Route Frequency Provider Last Rate Last Admin   acetaminophen (TYLENOL) tablet 650 mg  650 mg Oral Q6H PRN Marcelyn Bruins, MD   650 mg at 10/29/21 1645   Or   acetaminophen (TYLENOL) suppository 650 mg  650 mg Rectal Q6H PRN Marcelyn Bruins, MD       albuterol (PROVENTIL) (2.5 MG/3ML) 0.083% nebulizer solution 3 mL  3 mL Inhalation Q6H PRN Marcelyn Bruins, MD       amLODipine (NORVASC) tablet 10 mg  10 mg Oral Daily Arrien, Jimmy Picket, MD  10 mg at 10/30/21 0102   aspirin EC tablet 81 mg  81 mg Oral Daily Marcelyn Bruins, MD   81 mg at 10/30/21 7253   atorvastatin (LIPITOR) tablet 40 mg  40 mg Oral QHS Marcelyn Bruins, MD   40 mg at 10/29/21 2130   busPIRone (BUSPAR) tablet 15 mg  15 mg Oral BID Tawni Millers, MD   15 mg at 10/30/21 0903   enoxaparin (LOVENOX) injection 40 mg  40 mg  Subcutaneous Q24H Tawni Millers, MD   40 mg at 10/29/21 1647   feeding supplement (ENSURE ENLIVE / ENSURE PLUS) liquid 237 mL  237 mL Oral BID BM Arrien, Jimmy Picket, MD   237 mL at 10/30/21 0904   gabapentin (NEURONTIN) capsule 100 mg  100 mg Oral TID Tawni Millers, MD   100 mg at 10/30/21 6644   melatonin tablet 10 mg  10 mg Oral QHS PRN Kristopher Oppenheim, DO   10 mg at 10/27/21 2240   mometasone-formoterol (DULERA) 200-5 MCG/ACT inhaler 2 puff  2 puff Inhalation BID Marcelyn Bruins, MD   2 puff at 10/30/21 0347   multivitamins with iron tablet 1 tablet  1 tablet Oral Daily Arrien, Jimmy Picket, MD   1 tablet at 10/30/21 0903   pantoprazole (PROTONIX) EC tablet 40 mg  40 mg Oral Daily Marcelyn Bruins, MD   40 mg at 10/30/21 4259   sertraline (ZOLOFT) tablet 100 mg  100 mg Oral q morning Arrien, Jimmy Picket, MD   100 mg at 10/30/21 0913   sodium chloride flush (NS) 0.9 % injection 3 mL  3 mL Intravenous Once Marcelyn Bruins, MD       topiramate (TOPAMAX) tablet 50 mg  50 mg Oral Daily Arrien, Jimmy Picket, MD   50 mg at 10/30/21 5638   traZODone (DESYREL) tablet 50 mg  50 mg Oral QHS Tawni Millers, MD   50 mg at 10/29/21 2130     Discharge Medications: Please see discharge summary for a list of discharge medications.  Relevant Imaging Results:  Relevant Lab Results:   Additional Information [phone]  Milas Gain, LCSWA

## 2021-10-30 NOTE — Progress Notes (Signed)
LTM EEG hooked up after extended time. Patient was very confused and anxious. Patient's head was glued on scalp to hair, taped, and wrapped. Patient was instructed to stay in the bed and use bedpan if she needs to use restroom until tomorrow. Nurses informed about marker button. Impedance = good.
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
## 2021-10-30 NOTE — Consult Note (Signed)
Reason for consult: ''bipolar disorder, depression, confusion, wandering behavior, possible dementia, assess for reversable/treatable psych conditions.''  Referring Physician: Murray Hodgkins, MD  Patient seen face to face in her room. She remains confused and disoriented to time, place and person. She thinks she is in [place], [place] and trying to develop a program to provide food for people. She is unable to meaningfully participate in psychiatric assessment at this time.   Recommendations: -Consider Neurology consult to rule out or rule Dementia -Consider discontinuation of Topamax due to decreased memory and confusion. -Re-consult psychiatric service when patient is medically stabilized   Sandra Pilgrim, MD  Attending psychiatrist

## 2021-10-30 NOTE — Progress Notes (Signed)
Progress Note Sandra Robinson   ZOX:[id number]  DOB: 03/14/1951  DOA: 10/24/2021     5 Date of Service: 10/30/2021   Clinical Course 70 year old woman PMH stroke August, COPD, CKD, bipolar disorder, depression presented with altered mental status --11/7 admitted for altered mental status, consideration given to polypharmacy, hypokalemia.  BuSpar, sertraline, trazodone held. --11/8 sertraline, buspirone, topiramate, trazodone resumed, somewhat decreased doses including gabapentin.  Urinalysis negative, antibiotics discontinued. -- 11/9 confusion improving, weak, deconditioned, left side weaker than right per PT -- 11/10 stabilizing, rec for SNF -- 11/11 stabilizing rec for SNF -- 11/12 chart reviewed, remains confused; per grandson pt has been confused, not taking her mental health medications, has been losing weight, has been wandering around neighborhood; per son pt has been confused for several months; no family able to take care of her -- 11/13 MRI revealed multiple acute infarcts.  Neurology consultation pending.  Plan for SNF.  Assessment and Plan * Toxic encephalopathy -- Chart reviewed in detail, care everywhere, discussion with grandson and son, patient's has had confusion for several months and this is noted both on admission in August and recent admission in October.  Recent admission in October psychotropic medications were decreased as it was thought the patient might be noncompliant or medication doses were too high secondary to weight loss.  Grandson suspect patient is not taking medications correctly. -- Here the patient has been started on her medications at reduced dose.  Grandson feels like the problem is probably lack of treatment with her chronic psychotropics if she has been on these for more than 30 years in some fashion. -- Therefore at this point is not clear how much of this is acute or even indeed related to her medications, it is possible her confusion is actually  related to bipolar disease and other mental illness. -- Psychiatry was unable to provide meaningful evaluation based on patient's clinical condition.  Imaging did reveal multiple acute infarcts.  Given history I suspect multifactorial including medications, stroke, but predominantly developing dementia.  Patient does report noticing memory problems lately.  CVA (cerebral vascular accident) [facility]) -- Imaging revealed multiple acute infarcts, onset unknown, suspect significantly prior to admission, left-sided weakness was documented several days ago. -- Loop recorder interpretation as per Dr. Rayann Heman, discussed with him. -- Discussed with neurology, follow-up recommendations. -- No change on exam today.  Dementia without behavioral disturbance (HCC) -- Probable diagnosis at this point, history from family with months of confusion, wandering behavior, no longer felt to be safe to drive a car per grandson.  Patient has been living at home, apparently performing own ADLs and executive functions, however neither grandson nor son can confirm the actual state of things at home -- It is possible mental illness incompletely treated is contributing to this behavior, psychiatry consultation as above, follow-up with [facility] as an outpatient  Bipolar affective disorder in remission [facility]) -- Per grandson this is longstanding and patient has been on medication for many years, apparently not taking this medication appropriately in the outpatient setting or at least questionable.  Grandson wonders if mentation will improve with consistent medication and requested psychiatry consultation for assessment of current state and recommendation in regard to medications -- Perhaps consistent treatment would improve mentation.  Continue current treatment but hold Topamax per psychiatry.  History of stroke -- August of this year, also multiple infarcts seen on imaging -- I do not see any notation of patient's deficits from  August, review of therapy notes does not specify  focal weakness  COPD (chronic obstructive pulmonary disease) (HCC) -- Appears stable, continue bronchodilators  FTT -- With significant weight loss per family, follow-up as an outpatient  Malnutrition of moderate degree -- With associated failure to thrive and weight loss at home --Continue Multivitamin w/ minerals daily, Ensure Enlive po BID   Mixed hyperlipidemia -- Continue statin  Depression -- Flat affect, continue BuSpar, Zoloft, trazodone  Anxiety -- Currently calm, will continue BuSpar, Zoloft, trazodone   Prolonged QT interval-resolved as of 10/29/2021 -- Recheck EKG today and monitor given multiple psychotropics  Implantable loop recorder present Medtronic Reveal LINQ implantable loop recorder, per MRI is safe for MRI   Subjective:  Feels okay today.  Board  Objective Vitals:   10/29/21 0750 10/29/21 1957 10/29/21 2009 10/30/21 0528  BP: (!) 151/71 (!) 151/81  (!) 155/77  Pulse: 86 84  83  Resp: 16 17  18   Temp: 98.3 F (36.8 C) 97.7 F (36.5 C)  98.1 F (36.7 C)  TempSrc: Oral Oral    SpO2: 95% 98% 98% 97%  Weight:       70.5 kg  Vital signs were reviewed and unremarkable.  Exam Physical Exam Vitals reviewed.  Constitutional:      Appearance: She is not ill-appearing.  Cardiovascular:     Rate and Rhythm: Normal rate and regular rhythm.     Heart sounds: No murmur heard. Pulmonary:     Effort: Pulmonary effort is normal. No respiratory distress.     Breath sounds: No wheezing, rhonchi or rales.  Musculoskeletal:     Comments: No significant change appreciated today on exam.  Neurological:     Mental Status: She is disoriented.     Comments: Unaware of location, year, building.  No significant change in exam today.  Psychiatric:        Mood and Affect: Mood normal.        Behavior: Behavior normal.    Labs / Other Information My review of labs, imaging, notes and other tests is significant  for mild elevation AST, ALT, CBC unremarkable, B12 within normal limits     Disposition Plan: Status is: Inpatient  Remains inpatient appropriate because: Further stroke evaluation, will then need SNF.  Enoxaparin Son's voicemail box has not been set up   Time spent: 35 minutes [facility] 10/30/2021, 3:19 PM

## 2021-10-30 NOTE — Assessment & Plan Note (Addendum)
--   Imaging revealed multiple acute infarcts, onset unknown, suspect significantly prior to admission -- Loop recorder interpretation as per Dr. Rayann Heman, no atrial fibrillation noted.  Continue management per neurology with plans for TEE on Wednesday. EEG unrevealing.   -- aspirin 81 mg daily prior to admission, now on clopidogrel 75 mg daily and ASA 81mg  DAPT for 3 weeks and then Plavix alone -- LDL low.  Statin reduced. --Plan for SNF

## 2021-10-30 NOTE — Plan of Care (Signed)

## 2021-10-30 NOTE — TOC Initial Note (Signed)
Transition of Care [facility]) - Initial/Assessment Note    Patient Details  Name: Sandra Robinson MRN: [id number] Date of Birth: 1951/02/13  Transition of Care [facility]) CM/SW Contact:    Milas Gain, [place] Phone Number: 10/30/2021, 1:07 PM  Clinical Narrative:                  CSW received consult for possible SNF placement at time of discharge. Patient currently only oriented x2 , CSW spoke with patients son Sandra Robinson regarding PT recommendation of SNF placement at time of discharge. Patients son reports patient comes from home alone. Patients son expressed understanding of PT recommendation and is agreeable to SNF placement for patient  at time of discharge. Patients son  gave CSW permission to fax out initial referral near the [place] area. CSW discussed insurance authorization process.Patients son is unsure if patient has received the COVID vaccines. No further questions reported at this time. CSW to continue to follow and assist with discharge planning needs.   Expected Discharge Plan: Skilled Nursing Facility Barriers to Discharge: Continued Medical Work up   Patient Goals and CMS Choice Patient states their goals for this hospitalization and ongoing recovery are:: SNF CMS Medicare.gov Compare Post Acute Care list provided to:: Patient Represenative (must comment) (Patients son Sandra Robinson) Choice offered to / list presented to : Adult Children Manufacturing engineer)  Expected Discharge Plan and Services Expected Discharge Plan: [place] In-house Referral: Clinical Social Work     Living arrangements for the past 2 months: Single Family Home                                      Prior Living Arrangements/Services Living arrangements for the past 2 months: Single Family Home Lives with:: Self Patient language and need for interpreter reviewed:: Yes Do you feel safe going back to the place where you live?: No   SNF  Need for Family Participation in Patient Care: Yes (Comment) Care  giver support system in place?: Yes (comment)   Criminal Activity/Legal Involvement Pertinent to Current Situation/Hospitalization: No - Comment as needed  Activities of Daily Living Home Assistive Devices/Equipment: None ADL Screening (condition at time of admission) Patient's cognitive ability adequate to safely complete daily activities?: No Is the patient deaf or have difficulty hearing?: No Does the patient have difficulty seeing, even when wearing glasses/contacts?: No Does the patient have difficulty concentrating, remembering, or making decisions?: Yes Patient able to express need for assistance with ADLs?: Yes Does the patient have difficulty dressing or bathing?: Yes Independently performs ADLs?: Yes (appropriate for developmental age) Does the patient have difficulty walking or climbing stairs?: Yes Weakness of Legs: Both Weakness of Arms/Hands: Both  Permission Sought/Granted Permission sought to share information with : Case Manager, Family Supports, Chartered certified accountant granted to share information with : No  Share Information with NAME: Patient currently only oriented x2 Sandra Robinson  Permission granted to share info w AGENCY: Patient currently only oriented x2  SNF  Permission granted to share info w Relationship: Patient currently only oriented x2  son  Permission granted to share info w Contact Information: Patient currently only oriented x2 Sandra Robinson [phone]  Emotional Assessment       Orientation: : Oriented to Self, Oriented to Situation Alcohol / Substance Use: Not Applicable Psych Involvement: No (comment)  Admission diagnosis:  Hypokalemia [E87.6] Altered mental status, unspecified altered mental status type [E32.12] Metabolic encephalopathy [  G93.41] Patient Active Problem List   Diagnosis Date Noted   Dementia without behavioral disturbance ([place]) 10/29/2021   FTT 10/29/2021   Left-sided weakness 10/29/2021   Implantable loop recorder  present 10/29/2021   Malnutrition of moderate degree 10/27/2021   Depression 10/24/2021   COPD (chronic obstructive pulmonary disease) ([place]) 10/24/2021   Anxiety 10/11/2021   CKD (chronic kidney disease) 10/11/2021   Diverticulosis 10/11/2021   GERD (gastroesophageal reflux disease) 10/11/2021   Mixed hyperlipidemia 10/11/2021   Primary hypertension 10/11/2021   Bipolar affective disorder in remission ([place]) 10/09/2021   Cerebral thrombosis with cerebral infarction 08/15/2021   Encephalopathy 08/12/2021   Toxic encephalopathy 08/11/2021   Hypokalemia 08/11/2021   History of stroke 08/11/2021   Incontinence of feces 07/07/2018   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 Pharmacy:   [id number] - [place], [address] [place] [address] Phone: [phone] Fax: [phone]     Social Determinants of Health (SDOH) Interventions    Readmission Risk Interventions No flowsheet data found.

## 2021-10-31 ENCOUNTER — Encounter (HOSPITAL_COMMUNITY): Payer: Self-pay | Admitting: Internal Medicine

## 2021-10-31 ENCOUNTER — Inpatient Hospital Stay (HOSPITAL_COMMUNITY): Payer: Medicare HMO

## 2021-10-31 DIAGNOSIS — R4182 Altered mental status, unspecified: Secondary | ICD-10-CM

## 2021-10-31 DIAGNOSIS — I6340 Cerebral infarction due to embolism of unspecified cerebral artery: Secondary | ICD-10-CM

## 2021-10-31 DIAGNOSIS — I639 Cerebral infarction, unspecified: Secondary | ICD-10-CM

## 2021-10-31 DIAGNOSIS — F0390 Unspecified dementia without behavioral disturbance: Secondary | ICD-10-CM | POA: Diagnosis not present

## 2021-10-31 DIAGNOSIS — Z8673 Personal history of transient ischemic attack (TIA), and cerebral infarction without residual deficits: Secondary | ICD-10-CM | POA: Diagnosis not present

## 2021-10-31 DIAGNOSIS — I700 Atherosclerosis of aorta: Secondary | ICD-10-CM

## 2021-10-31 DIAGNOSIS — G929 Unspecified toxic encephalopathy: Secondary | ICD-10-CM | POA: Diagnosis not present

## 2021-10-31 DIAGNOSIS — F3170 Bipolar disorder, currently in remission, most recent episode unspecified: Secondary | ICD-10-CM | POA: Diagnosis not present

## 2021-10-31 HISTORY — DX: Atherosclerosis of aorta: I70.0

## 2021-10-31 LAB — C-REACTIVE PROTEIN: CRP: 0.6 mg/dL (ref <1.0)

## 2021-10-31 LAB — LIPID PANEL
Cholesterol: 108 mg/dL (ref 0–200)
HDL: 55 mg/dL (ref >40)
LDL Cholesterol: 20 mg/dL (ref 0–99)
Total CHOL/HDL Ratio: 2 RATIO
Triglycerides: 165 mg/dL — ABNORMAL HIGH (ref <150)
VLDL: 33 mg/dL (ref 0–40)

## 2021-10-31 LAB — HEMOGLOBIN A1C
Hgb A1c MFr Bld: 5.1 % (ref 4.8–5.6)
Mean Plasma Glucose: 99.67 mg/dL

## 2021-10-31 LAB — HIV ANTIBODY (ROUTINE TESTING W REFLEX): HIV Screen 4th Generation wRfx: NONREACTIVE

## 2021-10-31 LAB — SEDIMENTATION RATE: Sed Rate: 25 mm/hr — ABNORMAL HIGH (ref 0–22)

## 2021-10-31 IMAGING — CT CT ANGIO HEAD-NECK (W OR W/O PERF)
1 of 11 series · 5 of 33 positions shown · IV contrast (APPLIED)
Comparison: Prior MRI from [DATE] as well as prior CTA from
[DATE].

CLINICAL DATA: Follow-up examination for acute stroke.

EXAM:
CT ANGIOGRAPHY HEAD AND NECK
TECHNIQUE: Multidetector CT imaging of the head and neck was performed using
the standard protocol during bolus administration of intravenous
contrast. Multiplanar CT image reconstructions and MIPs were
obtained to evaluate the vascular anatomy. Carotid stenosis
measurements (when applicable) are obtained utilizing NASCET
criteria, using the distal internal carotid diameter as the
denominator.
CONTRAST:  50mL OMNIPAQUE IOHEXOL 350 MG/ML SOLN

[Series 12: ax thins · axial · 0.39mm/px · z∈[-255,-49]mm · 5 of 306 slices shown]
[im 51/306  soft-tissue]
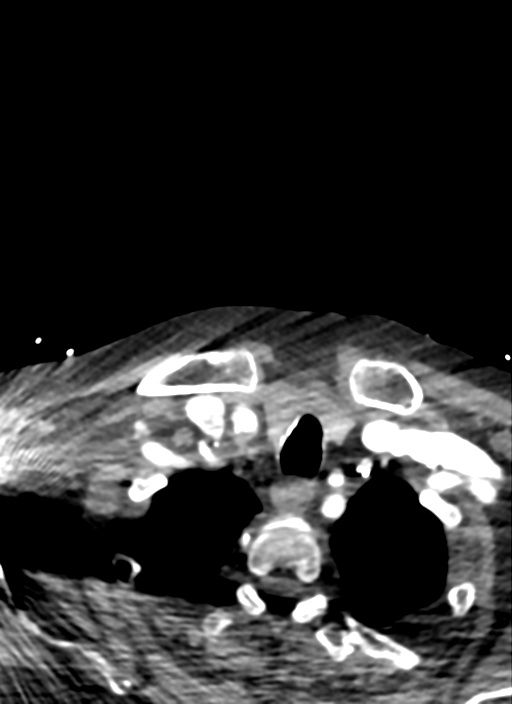
[im 102/306  bone]
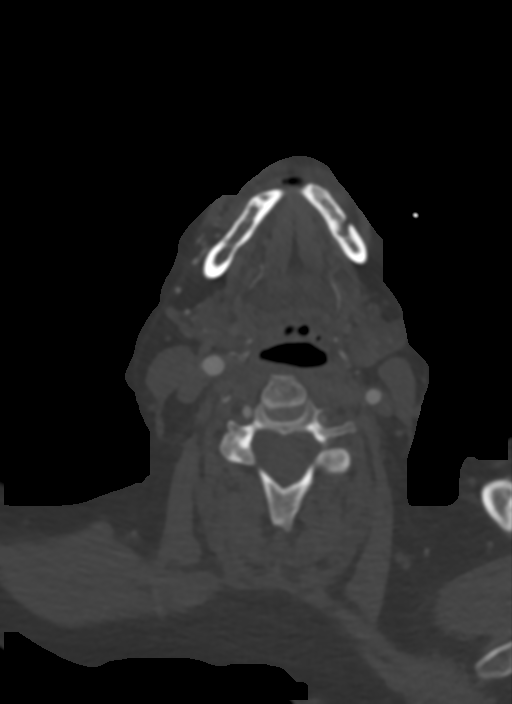
[im 153/306  soft-tissue]
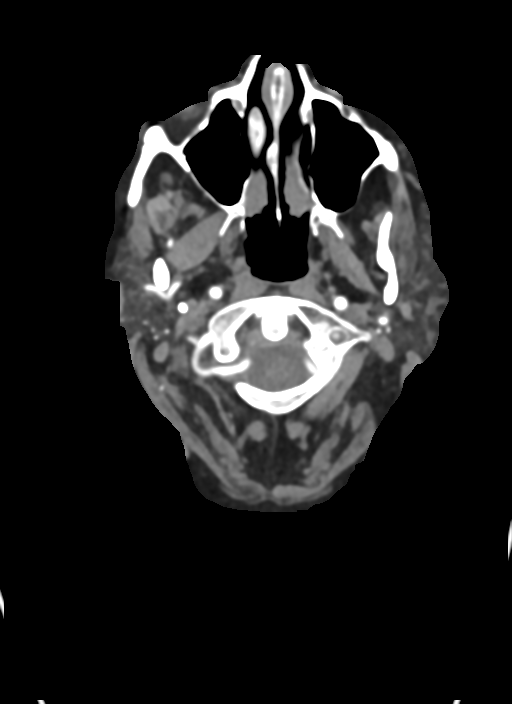
[im 204/306  bone]
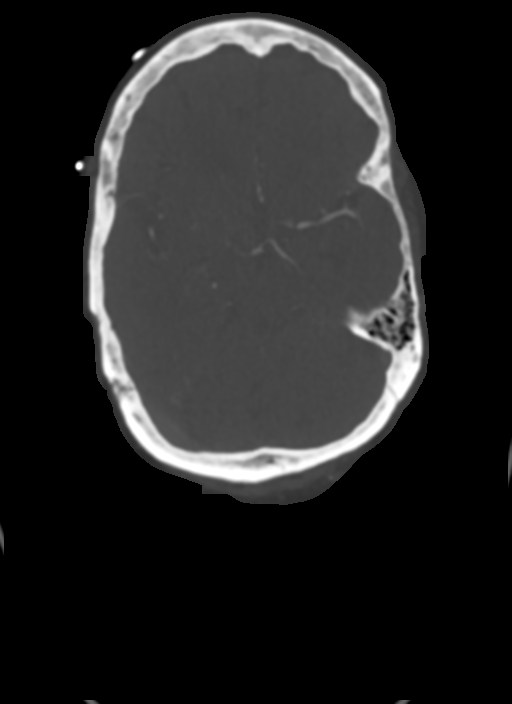
[im 255/306  soft-tissue]
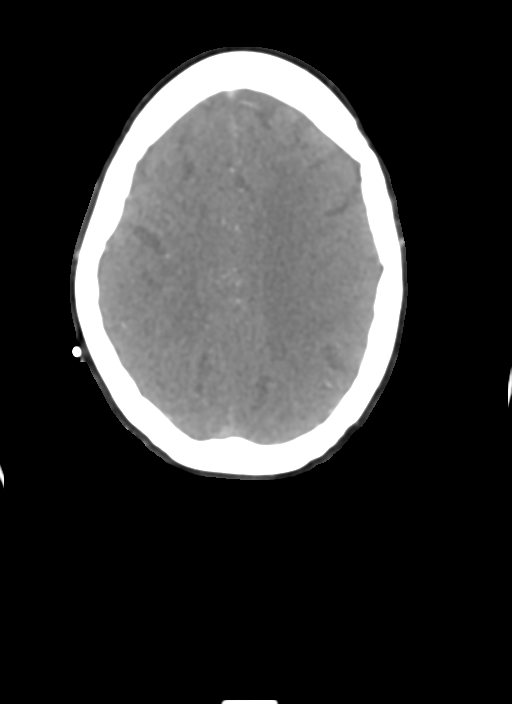

[5 of 33 positions shown; findings below may reference images not displayed]

FINDINGS: CT HEAD FINDINGS

Brain: Cerebral atrophy with chronic microvascular ischemic disease.
Remote lacunar infarcts noted about the left basal ganglia. Few
scattered remote bilateral cerebellar infarcts. Evolving late
subacute left PCA distribution infarct again noted. Previously
identified acute right posterior MCA distribution infarct grossly
stable from prior MRI, better characterized on that examination. No
associated mass effect or hemorrhage.

No other acute large vessel territory infarct or intracranial
hemorrhage. 14 mm meningioma overlying the left parietal convexity
again noted. No other mass lesion, mass effect or midline shift. No
hydrocephalus or extra-axial fluid collection.

Vascular: No hyperdense vessel. Scattered vascular calcifications
noted within the carotid siphons.

Skull: Scalp soft tissues demonstrate no acute finding. Calvarium
intact.

Sinuses: Visualized paranasal sinuses are clear. Trace bilateral
mastoid effusions noted, of doubtful significance.

Orbits: Left gaze noted. Globes and orbital soft tissues otherwise
unremarkable.

Review of the MIP images confirms the above findings

CTA NECK FINDINGS

Aortic arch: Visualized arch normal caliber with normal 3 vessel
morphology. Moderate atheromatous disease about the arch and origin
of the great vessels, stable.

Right carotid system: Right CCA tortuous proximally but remains
widely patent to the bifurcation. Eccentric bulky calcified plaque
at the proximal right ICA without hemodynamically significant
stenosis, stable. Right ICA remains patent distally to the skull
base without new stenosis or dissection.

Left carotid system: Atheromatous plaque at the origin of the left
CCA with stenosis measuring up to 40% by NASCET criteria. Left CCA
otherwise patent distally. Additional mild eccentric plaque at the
left carotid bulb without significant stenosis. Left ICA remains
patent distally without progressive stenosis or dissection.

Vertebral arteries: Both vertebral arteries arise from the
subclavian arteries. Right vertebral artery dominant and remains
patent to the skull base without stenosis or dissection.
Atheromatous plaque about the proximal left subclavian artery with
estimated 70% stenosis (series 12, image 275), stable. Hypoplastic
left vertebral artery remains patent proximally, but slowly occludes
at the V2/V3 segment overall appearance is relatively stable from
prior.

Skeleton: No visible acute osseous finding. No discrete or worrisome
osseous lesions. Patient is edentulous. Mild for age spondylosis.

Other neck: No other acute soft tissue abnormality within the neck.

Upper chest: Visualized upper chest demonstrates no acute finding.
Right shoulder arthroplasty noted.

Review of the MIP images confirms the above findings

CTA HEAD FINDINGS

Anterior circulation: Both internal carotid arteries remain patent
to the termini. Mild plaque within the siphons without significant
stenosis, stable. A1 segments remain patent, with the right being
slightly dominant. Normal anterior communicating artery complex.
Both ACAs remain patent to their distal aspects. No new M1 stenosis
or occlusion. Normal MCA bifurcations. Distal MCA branches remain
perfused and stable from previous.

Posterior circulation: Dominant right vertebral artery remains
patent to the vertebrobasilar junction without stenosis. Distal
reconstitution of the left V3 and V4 segments, stable from prior.
Focal moderate proximal left V4 stenosis prior to the takeoff of the
left PICA, stable (series 12, image 138). Left PICA remains patent.
Basilar remains patent to its distal aspect without new or
progressive stenosis. Superior cerebellar arteries remain patent
bilaterally. Both PCAs supplied via hypoplastic P1 segments and
prominent bilateral posterior communicating arteries. Right PCA
remains patent proximally, with multifocal moderate to severe distal
right P3 and P4 stenoses, stable. On the left, again seen is
moderate multifocal left P2 stenoses. There has likely been interval
recanalization of previously seen distal left P3/P4 stenosis,
although severe atheromatous disease seen throughout the distal left
PCA branches distally.

Venous sinuses: Grossly patent allowing for timing the contrast
bolus.

Anatomic variants: None significant. No new aneurysm. Left parietal
meningioma again noted.

Review of the MIP images confirms the above findings
IMPRESSION: CT HEAD IMPRESSION:

1. Continued interval evolution of small acute posterior right MCA
distribution infarct, stable from prior MRI. No interval hemorrhage
or other complication.
2. Underlying atrophy with advanced chronic microvascular ischemic
disease with multiple additional chronic ischemic infarcts as above,
otherwise stable.
3. 14 mm left parietal meningioma without edema or mass effect.

CTA HEAD AND NECK IMPRESSION:

1. Relatively stable CTA of the head and neck since [DATE]. No
new large vessel occlusion.
2. Occlusion of the hypoplastic left vertebral artery at the distal
left V2 segment with distal reconstitution. 70% stenosis involving
the upstream proximal left subclavian artery, with additional
downstream moderate V4 stenosis prior to the takeoff of the left
PICA.
3. Probable interval recanalization of previously seen distal left
P3/P4 occlusion, although there is severe underlying atheromatous
disease throughout the underlying distal PCA branches bilaterally.
4.  Aortic Atherosclerosis ([OV]-[OV]).

## 2021-10-31 MED ORDER — GABAPENTIN 300 MG PO CAPS
300.0000 mg | ORAL_CAPSULE | Freq: Two times a day (BID) | ORAL | Status: DC
  Administered 2021-10-31 – 2021-11-07 (×14): 300 mg via ORAL
  Filled 2021-10-31 (×14): qty 1

## 2021-10-31 MED ORDER — CLOPIDOGREL BISULFATE 75 MG PO TABS
75.0000 mg | ORAL_TABLET | Freq: Every day | ORAL | Status: DC
  Administered 2021-10-31 – 2021-11-07 (×7): 75 mg via ORAL
  Filled 2021-10-31 (×9): qty 1

## 2021-10-31 MED ORDER — IOHEXOL 350 MG/ML SOLN
50.0000 mL | Freq: Once | INTRAVENOUS | Status: AC | PRN
  Administered 2021-10-31: 50 mL via INTRAVENOUS

## 2021-10-31 MED ORDER — ATORVASTATIN CALCIUM 10 MG PO TABS
20.0000 mg | ORAL_TABLET | Freq: Every day | ORAL | Status: DC
  Administered 2021-11-01 – 2021-11-06 (×5): 20 mg via ORAL
  Filled 2021-10-31 (×5): qty 2

## 2021-10-31 NOTE — Progress Notes (Addendum)
STROKE TEAM PROGRESS NOTE   SUBJECTIVE (INTERVAL HISTORY) No family present at this time. Patient is sitting comfortably in the recliner eating lunch. Patient states that she is here because she had a stroke. She also endorses intermittent confusion. She remembers having her loop recorder placed previously.    OBJECTIVE Vitals:   10/30/21 2222 10/31/21 0445 10/31/21 0832 10/31/21 1410  BP: 129/76 (!) 166/88 127/89 (!) 142/99  Pulse: 81 80 85 (!) 120  Resp: _0 Temp: (!) 97.5 F (36.4 C) 98.4 F (36.9 C) (!) 97.5 F (36.4 C)   TempSrc:  Oral    SpO2: 99% 98% 99% 99%  Weight:        CBC:  Recent Labs  Lab 10/25/21 0245 10/30/21 1142  WBC 10.8* 9.0  NEUTROABS  --  5.6  HGB 13.8 13.7  HCT 41.0 42.0  MCV 88.0 89.7  PLT 266 333    Basic Metabolic Panel:  Recent Labs  Lab 10/26/21 0248 10/27/21 0329 10/30/21 1205  NA 138 137 138  K 3.7 4.3 4.7  CL 107 108 105  CO2 22 26 21*  GLUCOSE 88 87 158*  BUN _1 CREATININE 0.93 0.83 1.04*  CALCIUM 8.7* 8.6* 9.5  MG 1.7 2.3  --     Lipid Panel:  Recent Labs  Lab 10/31/21 0228  CHOL 108  TRIG 165*  HDL 55  CHOLHDL 2.0  VLDL 33  LDLCALC 20   HgbA1c:  Lab Results  Component Value Date   HGBA1C 5.1 10/31/2021   Urine Drug Screen:     Component Value Date/Time   LABOPIA NONE DETECTED 08/15/2021 2345   COCAINSCRNUR NONE DETECTED 08/15/2021 2345   LABBENZ NONE DETECTED 08/15/2021 2345   AMPHETMU NONE DETECTED 08/15/2021 2345   THCU NONE DETECTED 08/15/2021 2345   LABBARB NONE DETECTED 08/15/2021 2345    Alcohol Level     Component Value Date/Time   ETH <10 08/11/2021 1803    IMAGING  Results for orders placed or performed during the hospital encounter of 10/24/21  MR BRAIN WO CONTRAST   Narrative   CLINICAL DATA:  Follow-up examination for acute stroke, left-sided weakness.  EXAM: MRI HEAD WITHOUT CONTRAST  TECHNIQUE: Multiplanar, multiecho pulse sequences of the brain and  surrounding structures were obtained without intravenous contrast.  COMPARISON:  Prior CT from 10/24/2021.  FINDINGS: Brain: Examination is technically limited as the patient was unable to tolerate the full length of the study. Diffusion-weighted sequences and axial FLAIR sequence only were performed.  Cerebral volume within normal limits for age. Patchy and confluent T2/FLAIR hyperintensity involving the periventricular deep white matter both cerebral hemispheres most consistent with chronic small vessel ischemic disease, moderately advanced in nature. Few scattered remote lacunar infarcts present about the bilateral basal ganglia and left corona radiata. Additional remote lacunar infarcts present at the right pons and mid cerebellum. Few additional scattered remote cerebellar infarcts noted peripherally. Encephalomalacia and gliosis involving the parasagittal left occipital lobe with mild residual diffusion signal, likely an evolving late subacute to chronic left PCA distribution infarct.  Scattered patchy foci of restricted diffusion involving the cortical and subcortical aspect of the posterior right frontoparietal region, consistent with an acute ischemic infarct, right MCA distribution (series 2, image 34). No significant mass effect. No obvious signal changes to suggest associated hemorrhage on this limited exam. Probable additional small acute to early subacute infarct involving the posterior limb of the right internal capsule noted (series 2, image 26).  Otherwise, gray-white matter differentiation grossly maintained. 14 mm meningioma noted at the left parietal convexity without mass effect. No other mass lesion, mass effect, or midline shift. No hydrocephalus or extra-axial fluid collection.  Vascular: Abnormal flow void within the proximal left V4 segment, likely reflecting slow flow given findings on prior CTA from 08/14/2021. Major intracranial vascular flow voids  are otherwise maintained.  Skull and upper cervical spine: Craniocervical junction not well assessed on this limited study. No visible abnormality about the scalp or calvarium.  Sinuses/Orbits: Globes and orbital soft tissues demonstrate no acute finding. Paranasal sinuses are largely clear. Trace left greater than right mastoid effusions noted  Other: None.  IMPRESSION: 1. Technically limited exam due to the patient's inability to tolerate the full length of the study. DWI and FLAIR sequences only were performed. 2. Scattered acute ischemic infarcts involving the posterior right frontoparietal region, right MCA distribution. No significant mass effect. 3. Probable additional subcentimeter acute to early subacute infarct involving the posterior limb of the right internal capsule. 4. Underlying moderate chronic microvascular ischemic disease with multiple additional chronic ischemic changes as above. 5. 14 mm meningioma at the left parietal convexity without mass effect.   Electronically Signed   By: Jeannine Boga M.D.   On: 10/30/2021 02:59   CT HEAD WO CONTRAST   Narrative   CLINICAL DATA:  Mental status change  EXAM: CT HEAD WITHOUT CONTRAST  TECHNIQUE: Contiguous axial images were obtained from the base of the skull through the vertex without intravenous contrast.  COMPARISON:  MRI 08/15/2021, CT 08/11/2021  FINDINGS: Brain: No acute territorial infarction, hemorrhage or new intracranial mass. Left parietal convexity meningioma on MRI is better seen on MRI. Multiple chronic infarcts involving the right greater than left cerebellum, left basal ganglia, left white matter, and left posteromedial temporal lobe and medial left occipital lobe. Atrophy and chronic small vessel ischemic changes of the white matter. Stable ventricle size.  Vascular: No hyperdense vessels.  No unexpected calcification  Skull: Normal. Negative for fracture or focal  lesion.  Sinuses/Orbits: No acute finding.  Other: None  IMPRESSION: 1. No definite CT evidence for acute intracranial abnormality. 2. Atrophy and chronic small vessel ischemic changes of the white matter. Multiple chronic infarcts as described above.   Electronically Signed   By: Donavan Foil M.D.   On: 10/24/2021 16:39   Results for orders placed or performed during the hospital encounter of 08/11/21  MR BRAIN W CONTRAST   Narrative   CLINICAL DATA:  Presents to the ED 08/11/2021 for confusion found to have acute left PCA infarct. Evaluate for mass lesion.  EXAM: MRI HEAD WITH CONTRAST  TECHNIQUE: Multiplanar, multiecho pulse sequences of the brain and surrounding structures were obtained with intravenous contrast.  CONTRAST:  7.37m GADAVIST GADOBUTROL 1 MMOL/ML IV SOLN  COMPARISON:  Brain MRI 08/13/2021, CTA head/neck 08/14/2021  FINDINGS: Only pre and postcontrast T1 images are included on the current examination.  Brain: There is a homogeneously enhancing extra-axial lesion overlying the left parietal lobe measuring 1.5 cm by 1.3 cm by 1.2 cm consistent with a meningioma. There is no significant mass effect on the underlying brain parenchyma.  There is mild curvilinear cortically based enhancement in the left PCA territory consistent with evolving subacute infarct. There is no other abnormal enhancement.  Remote lacunar infarcts in the left basal ganglia and corona radiata are again seen.  Vascular: Normal flow voids.  Skull and upper cervical spine: Normal marrow signal.  Sinuses/Orbits: The paranasal sinuses  appear clear. The globes and orbits are unremarkable.  Other: None.  IMPRESSION: 1. Homogeneously enhancing extra-axial lesion overlying the left parietal lobe is consistent with a meningioma. 2. Cortically based enhancement in the left PCA territory consistent with evolving subacute infarct.   Electronically Signed   By: Valetta Mole  M.D.   On: 08/15/2021 10:29        PHYSICAL EXAM  Physical Exam  Constitutional: Appears well-developed and well-nourished.  Psych: Affect appropriate to situation Eyes: No scleral injection HENT: No OP obstrucion MSK: no joint deformities.  Cardiovascular: Normal rate and regular rhythm.  Respiratory: Effort normal, non-labored breathing GI: Soft.  No distension. There is no tenderness.  Skin: WDI  Neuro: Mental Status: Patient is awake, alert, oriented to person and place. Disoriented to month and year. Patient is able to give a limited history. She does have intermittent confusion and forgetfulness.  No signs of aphasia or neglect  Cranial Nerves: II:  Pupils are equal, round, and reactive to light. Left visual field deficit III,IV, VI: EOMI without ptosis or diploplia.  V: Facial sensation is symmetric VII: Facial movement is symmetric resting and smiling VIII: Hearing is intact to voice X: Palate elevates symmetrically XI: Shoulder shrug is symmetric. XII: Tongue protrudes midline without atrophy or fasciculations.   Motor: RUE: grips  5/5       LUE: grips  3/5      RLE:  knee  5/5   plantar flexion  5/5    dorsiflexion   5/5 LLE:  knee  4/5   plantar flexion   4/5    dorsiflexion   4/5 Tone is normal. Bulk is normal.  Fine motor impairment LUE Sensory: Light touch sensation impaired in LUE and LLE.  Cerebellar: FNF intact  Gait deferred for safety reasons    ASSESSMENT/PLAN Ms. NAZLI PENN is a 70 y.o. female with history of previous stroke, COPD, CKD, bipolar disorder, depression presenting via EMS with Altered Mental Status. CT and MRI show multiple scattered acute and chronic infarcts. Previous admission in August for stroke, loop implanted, however patient did not follow with cardiology outpatient. TEE planned for Wednesday.   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 MRI head- Scattered acute ischemic infarcts involving the posterior right frontoparietal region, right MCA scattered distribution.  2D Echo  EF 55-60%  BLE venous doppler- no evidence of DVT Loop recorder interrogation no A. Fib EEG normal limits. TEE pending LDL 20 HgbA1c 5.1 CRP 0.5, ESR 25 Lovenox for VTE prophylaxis aspirin 81 mg daily prior to admission, now on clopidogrel 75 mg daily and ASA 82m DAPT for 3 weeks and then Plavix alone Patient counseled to be compliant with her antithrombotic medications Ongoing aggressive stroke risk factor management Therapy recommendations - SNF Disposition - pending  History of stroke 07/2021 left PCA infarct and right MCA punctate infarct.  CTA head and neck showed left CCA 60% stenosis, left subclavian artery and left VA high-grade stenosis, left P3/P4 occlusion.  EF 55 to 60%, LDL 106, A1c 5.3, loop recorder placed.  Discharged on DAPT and Lipitor 40.  Hypertension Stable Amlodipine 162mLong-term BP goal normotensive  Hyperlipidemia Home meds:  Atorvastatin 4082mresumed in hospital LDL 20, goal < 70 Decrease Lipitor to 20 given low  LDL level Continue statin at discharge  Anxiety, Depression, Bipolar Disorder Psychiatry on board Sertaline 185m Trazadone 530mBuspirone 1554m  Hospital day # 6    Pt seen by NP/Neuro and later by MD. Note/plan to be edited by MD as needed.  DevJanine OresNP-BC [facility]  ATTENDING NOTE: I reviewed above note and agree with the assessment and plan. Pt was seen and examined.   [age]ar old female with history of stroke in 07/2021, bipolar, CKD, hypertension, hyperlipidemia admitted for increased confusion and altered mental status.    In 07/2021 patient MCA infarct and right MCA  punctate infarcts.  CTA neck showed left CCA 60% stenosis, left subclavian and left VA high-grade stenosis, left P3/P4 occlusion.  EF 55 to 60%, LDL 106, A1c 5.3.  Loop recorder placed.  Discharged on DAPT and Lipitor 40.  This admission CT no acute abnormality.  MRI showed right BG and right MCA scattered infarcts.  CTA head and neck no significant change from 07/2021 except left P3/P4 recannulized.  EF 60 to 65%.  DVT negative.  Loop recorder interrogation negative for A. fib.  EEG no seizure.  CPR 0.5, ESR 25, LDL 20 and A1c 5.1.  Etiology for patient current stroke unclear, still embolic pattern.  Loop recorder no A. fib, will recommend TEE to further evaluate.  Continue aspirin 81 and Plavix 75 DAPT for 3 weeks and then Plavix alone.  Decrease Lipitor from 40 to 20 given low LDL level.  Will follow.  For detailed assessment and plan, please refer to above as I have made changes wherever appropriate.   JinRosalin HawkingD PhD Stroke Neurology 10/31/2021 11:16 PM  I discussed with Dr. GooSarajane Jewsardio EP PA AndJonni Sanger spent  35 minutes in total face-to-face time with the patient, more than 50% of which was spent in counseling and coordination of care, reviewing test results, images and medication, and discussing the diagnosis, treatment plan and potential prognosis. This patient's care requiresreview of multiple databases, neurological assessment, discussion with family, other specialists and medical decision making of high complexity.    To contact Stroke Continuity provider, please refer to [email or web address], contact General Neurology

## 2021-10-31 NOTE — Progress Notes (Signed)
Murray Hodgkins MD, ordered tele observation for this patient, however called Tele and stated they did not have enough tele cameras. Pt does not have an assigned tele sitter at this time.
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
## 2021-10-31 NOTE — Progress Notes (Signed)
LTM discontinued now. No skin breakdown noted. Results pending.

## 2021-10-31 NOTE — Progress Notes (Signed)
Mobility Specialist Criteria Algorithm Info.  Mobility Team: [facility] elevated:HOB 30 Activity: Ambulated in room; Ambulated to bathroom Range of motion: Active; All extremities Level of assistance: Minimal assist, patient does 75% or more Assistive device: Front wheel walker Distance ambulated (ft): 35 ft Mobility response: Tolerated well Bed Position: Semi-fowlers  Patient received in bed very eager to get OOB. Got to EOB with min A, assisted pt more so with trunk elevation from supine to sit. Stood with supervision and ambulated in hallway at min guard with steady gait. Sat in recliner chair after ambulating short distance in room. Was left with lunch and bedside table in front of her, all needs met. Call bell in reach, chair alarm set.   10/31/2021 1:34 PM

## 2021-10-31 NOTE — Progress Notes (Signed)
Bilateral lower extremity venous duplex has been completed. Preliminary results can be found in CV Proc through chart review.   10/31/21 2:12 PM Carlos Levering RVT

## 2021-10-31 NOTE — Procedures (Addendum)
Patient Name: PIHU BASIL  MRN: [id number]  Epilepsy Attending: Lora Havens  Referring Physician/Provider: Gardiner Barefoot, NP Duration: 10/30/2021  1500 to 10/31/2021 1453  Patient history: 70 yo female who was admitted 5 days ago with AMS, however, noted to be confused for a few weeks.  EEG to evaluate for seizure.  Level of alertness: Awake, asleep  AEDs during EEG study: GBP  Technical aspects: This EEG study was done with scalp electrodes positioned according to the 10-20 International system of electrode placement. Electrical activity was acquired at a sampling rate of 500Hz  and reviewed with a high frequency filter of 70Hz  and a low frequency filter of 1Hz . EEG data were recorded continuously and digitally stored.   Description: The posterior dominant rhythm consists of 8 Hz activity of moderate voltage (25-35 uV) seen predominantly in posterior head regions, symmetric and reactive to eye opening and eye closing. Sleep was characterized by vertex waves, sleep spindles (12 to 14 Hz), maximal frontocentral region. Hyperventilation and photic stimulation were not performed.     IMPRESSION: This study is within normal limits. No seizures or epileptiform discharges were seen throughout the recording.  [place] Stanger Barbra Sarks

## 2021-10-31 NOTE — Progress Notes (Signed)
Attempted calling daughter, Anne Ng regarding pt fall unsuccessful.

## 2021-10-31 NOTE — Progress Notes (Signed)
Walked into patient's room at 1410. Patient was found sitting on the floor after an unwitnessed fall. MD Murray Hodgkins notified. New orders were acknowledged. Vital signs as follows: BP 142/99, HR 120, Oxygen saturation 99%. Patient is now sitting in recliner with chair alarm on. Pt denies any pain at this time. No new injuries.

## 2021-10-31 NOTE — Progress Notes (Signed)
Progress Note Sandra Robinson   [id number]  DOB: [birth year]  DOA: 10/24/2021     6 Date of Service: 10/31/2021   Clinical Course 70 year old woman PMH stroke August, COPD, CKD, bipolar disorder, depression presented with altered mental status --11/7 admitted for altered mental status, consideration given to polypharmacy, hypokalemia.  BuSpar, sertraline, trazodone held. --11/8 sertraline, buspirone, topiramate, trazodone resumed, somewhat decreased doses including gabapentin.  Urinalysis negative, antibiotics discontinued. -- 11/9 confusion improving, weak, deconditioned, left side weaker than right per PT -- 11/10 stabilizing, rec for SNF -- 11/11 stabilizing rec for SNF -- 11/12 chart reviewed, remains confused; per grandson pt has been confused, not taking her mental health medications, has been losing weight, has been wandering around neighborhood; per son pt has been confused for several months; no family able to take care of her -- 11/13 MRI revealed multiple acute infarcts.  Neurology consultation pending.  Plan for SNF.  Loop recorder interrogation unremarkable.  EEG unremarkable thus far. -- 11/14 plan for TEE 11/15.  Unwitnessed fall without apparent injury.  Started on fall precautions, bed alarm, telemetry sitter.  Plan for SNF eventually.  Assessment and Plan * Toxic encephalopathy -- See previous notes for details, but in brief concern was given to polypharmacy accidental misuse of medications, or noncompliance with psychotropics.  However, during the course of the hospitalization, no particular inciting event or likely etiology was discovered in regard to medications.  Evaluated by psychiatry, and medications thought to be noncontributory.  After further thought on this, as well as history from family, the patient's been confused for some time with wandering behavior.  Confusion was also documented on hospitalization back in August. -- At this point cannot rule out a toxic  encephalopathy as contributing to presentation, however suspect more likely acute stroke, complicated by suspected dementia  CVA (cerebral vascular accident) [facility]) -- Imaging revealed multiple acute infarcts, onset unknown, suspect significantly prior to admission -- Loop recorder interpretation as per Dr. Rayann Heman, no atrial fibrillation noted.  Continue management per neurology with plans for TEE on Wednesday and long-term EEG thus far unrevealing.  Medications per neurology.  Dementia without behavioral disturbance (HCC) -- Probable diagnosis at this point, history from family with months of confusion, wandering behavior, no longer felt to be safe to drive a car per grandson.  Patient has been living at home, apparently performing own ADLs and executive functions, however neither grandson nor son can confirm the actual state of things at home  Bipolar affective disorder in remission [facility]) -- Per grandson this is longstanding and patient has been on medication for many years, apparently not taking this medication appropriately in the outpatient setting or at least questionable.  Seen by psychiatry, not felt to have signs or symptoms of decompensated bipolar disorder.  Recommendation was to continue gabapentin with up titration as indicated, Buspar, trazodone, Zoloft.  History of stroke -- August of this year, also multiple infarcts seen on imaging -- I do not see any notation of patient's deficits from August, review of therapy notes does not specify focal weakness  COPD (chronic obstructive pulmonary disease) ([place]) -- Appears stable, continue bronchodilators  FTT -- With significant weight loss per family, follow-up as an outpatient  Malnutrition of moderate degree -- With associated failure to thrive and weight loss at home --Continue Multivitamin w/ minerals daily, Ensure Enlive po BID   Mixed hyperlipidemia -- Continue statin  Depression -- Flat affect, continue BuSpar, Zoloft,  trazodone  Anxiety -- Remains calm, will continue BuSpar,  Zoloft, trazodone   Prolonged QT interval-resolved as of 10/29/2021 -- Recheck EKG today and monitor given multiple psychotropics  Implantable loop recorder present [employer] Reveal LINQ implantable loop recorder, per MRI is safe for MRI   Subjective:  Feels fine today, no complaints.  Objective Vitals:   10/30/21 2222 10/31/21 0445 10/31/21 0832 10/31/21 1410  BP: 129/76 (!) 166/88 127/89 (!) 142/99  Pulse: 81 80 85 (!) 120  Resp: 16 18 19 19   Temp: (!) 97.5 F (36.4 C) 98.4 F (36.9 C) (!) 97.5 F (36.4 C)   TempSrc:  Oral    SpO2: 99% 98% 99% 99%  Weight:       70.5 kg  Vital signs were reviewed and unremarkable.  Exam Physical Exam Constitutional:      General: She is not in acute distress. Cardiovascular:     Rate and Rhythm: Normal rate and regular rhythm.     Heart sounds: No murmur heard. Pulmonary:     Effort: Pulmonary effort is normal. No respiratory distress.     Breath sounds: No rhonchi or rales.  Neurological:     Mental Status: She is alert.     Comments: Moves all extremities  Psychiatric:        Mood and Affect: Mood normal.     Comments: Odd affect   Labs / Other Information My review of labs, imaging, notes and other tests is significant for HIV nonreactive,   preliminary bilateral lower extremity venous Dopplers negative.  Echocardiogram LVEF 63-14%, grade 1 diastolic dysfunction.  Disposition Plan: Status is: Inpatient  Remains inpatient appropriate because: Stroke work-up in progress including pending TEE and EEG  Enoxaparin Updated son by telephone  Time spent: 35 minutes [facility] 10/31/2021, 3:48 PM

## 2021-11-01 DIAGNOSIS — F3170 Bipolar disorder, currently in remission, most recent episode unspecified: Secondary | ICD-10-CM | POA: Diagnosis not present

## 2021-11-01 DIAGNOSIS — F0390 Unspecified dementia without behavioral disturbance: Secondary | ICD-10-CM | POA: Diagnosis not present

## 2021-11-01 DIAGNOSIS — G929 Unspecified toxic encephalopathy: Secondary | ICD-10-CM | POA: Diagnosis not present

## 2021-11-01 DIAGNOSIS — R531 Weakness: Secondary | ICD-10-CM | POA: Diagnosis not present

## 2021-11-01 DIAGNOSIS — I6340 Cerebral infarction due to embolism of unspecified cerebral artery: Secondary | ICD-10-CM | POA: Diagnosis not present

## 2021-11-01 DIAGNOSIS — R4182 Altered mental status, unspecified: Secondary | ICD-10-CM | POA: Diagnosis not present

## 2021-11-01 NOTE — Progress Notes (Signed)
Progress Note Sandra Robinson   [id number]  DOB: 30-Sep-1951  DOA: 10/24/2021     7 Date of Service: 11/01/2021   Clinical Course 70 year old woman PMH stroke August, COPD, CKD, bipolar disorder, depression presented with altered mental status --11/7 admitted for altered mental status, consideration given to polypharmacy, hypokalemia.  BuSpar, sertraline, trazodone held. --11/8 sertraline, buspirone, topiramate, trazodone resumed, somewhat decreased doses including gabapentin.  Urinalysis negative, antibiotics discontinued. -- 11/9 confusion improving, weak, deconditioned, left side weaker than right per PT -- 11/10 stabilizing, rec for SNF -- 11/11 stabilizing rec for SNF -- 11/12 chart reviewed, remains confused; per grandson pt has been confused, not taking her mental health medications, has been losing weight, has been wandering around neighborhood; per son pt has been confused for several months; no family able to take care of her -- 11/13 MRI revealed multiple acute infarcts.  Neurology consultation pending.  Plan for SNF.  Loop recorder interrogation unremarkable.  EEG unremarkable thus far. -- 11/14 plan for TEE 11/16.  Unwitnessed fall without apparent injury.  Started on fall precautions, bed alarm, telemetry sitter.  Plan for SNF eventually. --11/15 TEE planned 11/16.  Anticipate stability for transfer to SNF thereafter.  Mentation improved today.  Consults: Neurology Psychiatry   Assessment and Plan * CVA (cerebral vascular accident) [facility]) -- Imaging revealed multiple acute infarcts, onset unknown, suspect significantly prior to admission -- Loop recorder interpretation as per Dr. Rayann Heman, no atrial fibrillation noted.  Continue management per neurology with plans for TEE on Wednesday. EEG unrevealing.   -- aspirin 81 mg daily prior to admission, now on clopidogrel 75 mg daily and ASA 81mg  DAPT for 3 weeks and then Plavix alone -- LDL low.  Statin reduced. --Plan for  SNF  Toxic encephalopathy -- See previous notes for details, but in brief concern was given to polypharmacy accidental misuse of medications, or noncompliance with psychotropics.  However, during the course of the hospitalization, no particular inciting event or likely etiology was discovered in regard to medications.  Evaluated by psychiatry, and medications thought to be noncontributory.  After further thought on this, as well as history from family, the patient's been confused for some time with wandering behavior.  Confusion was also documented on hospitalization back in August. -- At this point cannot rule out a toxic encephalopathy as contributing to presentation, however suspect more likely acute stroke, complicated by suspected dementia -- More aware and alert today.  Dementia without behavioral disturbance (HCC) -- Probable diagnosis at this point, history from family with months of confusion, wandering behavior, no longer felt to be safe to drive a car per grandson.  Patient has been living at home, apparently performing own ADLs and executive functions, however neither grandson nor son can confirm the actual state of things at home  Bipolar affective disorder in remission [facility]) -- Per grandson this is longstanding and patient has been on medication for many years, apparently not taking this medication appropriately in the outpatient setting or at least questionable.  Seen by psychiatry, not felt to have signs or symptoms of decompensated bipolar disorder.  Recommendation was to continue gabapentin with up titration as indicated, Buspar, trazodone, Zoloft.  COPD (chronic obstructive pulmonary disease) (HCC) -- Appears stable, continue bronchodilators  FTT -- With significant weight loss per family, follow-up as an outpatient  Malnutrition of moderate degree -- With associated failure to thrive and weight loss at home --Continue Multivitamin w/ minerals daily, Ensure Enlive po BID    Mixed hyperlipidemia --  Continue statin, but at lower dose given low LDL  Depression -- Flat affect, continue BuSpar, Zoloft, trazodone  Anxiety -- Remains calm, will continue BuSpar, Zoloft, trazodone   Prolonged QT interval-resolved as of 10/29/2021 -- Recheck EKG today and monitor given multiple psychotropics  Aortic atherosclerosis (HCC) --continue statin  Implantable loop recorder present  Subjective:  Feels fine today, no complaints.  Objective Vitals:   11/01/21 0457 11/01/21 0740 11/01/21 0822 11/01/21 1531  BP: 140/74 (!) 151/77  138/77  Pulse: 88 80  97  Resp: 16 16  16   Temp: 97.6 F (36.4 C) 97.9 F (36.6 C)  98.3 F (36.8 C)  TempSrc: Axillary   Oral  SpO2: 97% 95% 94% 98%  Weight:       70.5 kg  Vital signs were reviewed and unremarkable.  Exam Physical Exam Vitals reviewed.  Constitutional:      General: She is not in acute distress. Cardiovascular:     Rate and Rhythm: Normal rate and regular rhythm.     Heart sounds: No murmur heard. Pulmonary:     Effort: Pulmonary effort is normal. No respiratory distress.     Breath sounds: No wheezing, rhonchi or rales.  Musculoskeletal:     Comments: Moves all legs to command.  Neurological:     Comments: Strength appears to be a bit better in the left upper and lower extremity.  Aware today that she is in the hospital, November, both improvements.  Psychiatric:        Mood and Affect: Mood normal.        Behavior: Behavior normal.    Labs / Other Information My review of labs, imaging, notes and other tests is significant for no lower extremity DVT    Disposition Plan: Status is: Inpatient  Remains inpatient appropriate because: TEE planned tomorrow, then needs SNF.  Enoxaparin  Updated son by telephone  Time spent: 25 minutes [facility] 11/01/2021, 4:34 PM

## 2021-11-01 NOTE — H&P (View-Only) (Signed)
    [facility] has been requested to perform a transesophageal echocardiogram on Ms. Sandra Robinson for CVA.    Patient initially admitted for AMS. She is A& O x 4 during my evaluation. Able to answer questions appropriately.   After careful review of history and examination, the risks and benefits of transesophageal echocardiogram have been explained including risks of esophageal damage, perforation (1:10,000 risk), bleeding, pharyngeal hematoma as well as other potential complications associated with conscious sedation including aspiration, arrhythmia, respiratory failure and death. Alternatives to treatment were discussed, questions were answered. Patient is willing to proceed.  TEE - Dr. Harl Bowie @ 2pm 11/02/21. NPO after midnight. Meds with sips.   If any sign of confusion in morning, Please reach out to card master. Need to obtain consent from DPR person then.   Leanor Kail, PA-C 11/01/2021 6:54 PM

## 2021-11-01 NOTE — Progress Notes (Signed)
Mobility Specialist Criteria Algorithm Info.  Mobility Team: [facility] elevated:HOB 30 Activity: Ambulated in room Range of motion: Active; All extremities Level of assistance: Minimal assist, patient does 75% or more Assistive device: Front wheel walker Distance ambulated (ft): 30 ft Mobility response: Tolerated well Bed Position: Semi-fowlers  Patient received lying in bed asleep but easily awakened. Required mod A to EOB and min A sit to stand plus verbal cues for hand placement. Ambulated in room a short distance and returned back to bed without incident. Was left lying in bed with all needs met and call bell in reach. Bed alarm set.  11/01/2021 4:28 PM

## 2021-11-01 NOTE — Assessment & Plan Note (Signed)
continue statin

## 2021-11-01 NOTE — Progress Notes (Addendum)
STROKE TEAM PROGRESS NOTE   SUBJECTIVE (INTERVAL HISTORY) No family present at this time. Patient is resting comfortably in bed. Patient states that she is here because she had a stroke. She also endorses intermittent confusion. She remembers having her loop recorder placed previously and she remembers [place] from the previous day.    OBJECTIVE Vitals:   10/31/21 2004 11/01/21 0457 11/01/21 0740 11/01/21 0822  BP: (!) 158/76 140/74 (!) 151/77   Pulse: 86 88 80   Resp:  16 16   Temp: 97.6 F (36.4 C) 97.6 F (36.4 C) 97.9 F (36.6 C)   TempSrc: Axillary Axillary    SpO2: 100% 97% 95% 94%  Weight:        CBC:  Recent Labs  Lab 10/30/21 1142  WBC 9.0  NEUTROABS 5.6  HGB 13.7  HCT 42.0  MCV 89.7  PLT 298     Basic Metabolic Panel:  Recent Labs  Lab 10/26/21 0248 10/27/21 0329 10/30/21 1205  NA 138 137 138  K 3.7 4.3 4.7  CL 107 108 105  CO2 22 26 21*  GLUCOSE 88 87 158*  BUN 10 15 22   CREATININE 0.93 0.83 1.04*  CALCIUM 8.7* 8.6* 9.5  MG 1.7 2.3  --      Lipid Panel:  Recent Labs  Lab 10/31/21 0228  CHOL 108  TRIG 165*  HDL 55  CHOLHDL 2.0  VLDL 33  LDLCALC 20    HgbA1c:  Lab Results  Component Value Date   HGBA1C 5.1 10/31/2021   Urine Drug Screen:     Component Value Date/Time   LABOPIA NONE DETECTED 08/15/2021 2345   COCAINSCRNUR NONE DETECTED 08/15/2021 2345   LABBENZ NONE DETECTED 08/15/2021 2345   AMPHETMU NONE DETECTED 08/15/2021 2345   THCU NONE DETECTED 08/15/2021 2345   LABBARB NONE DETECTED 08/15/2021 2345    Alcohol Level     Component Value Date/Time   ETH <10 08/11/2021 1803    IMAGING  Results for orders placed or performed during the hospital encounter of 10/24/21  MR BRAIN WO CONTRAST   Narrative   CLINICAL DATA:  Follow-up examination for acute stroke, left-sided weakness.  EXAM: MRI HEAD WITHOUT CONTRAST  TECHNIQUE: Multiplanar, multiecho pulse sequences of the brain and surrounding structures were obtained  without intravenous contrast.  COMPARISON:  Prior CT from 10/24/2021.  FINDINGS: Brain: Examination is technically limited as the patient was unable to tolerate the full length of the study. Diffusion-weighted sequences and axial FLAIR sequence only were performed.  Cerebral volume within normal limits for age. Patchy and confluent T2/FLAIR hyperintensity involving the periventricular deep white matter both cerebral hemispheres most consistent with chronic small vessel ischemic disease, moderately advanced in nature. Few scattered remote lacunar infarcts present about the bilateral basal ganglia and left corona radiata. Additional remote lacunar infarcts present at the right pons and mid cerebellum. Few additional scattered remote cerebellar infarcts noted peripherally. Encephalomalacia and gliosis involving the parasagittal left occipital lobe with mild residual diffusion signal, likely an evolving late subacute to chronic left PCA distribution infarct.  Scattered patchy foci of restricted diffusion involving the cortical and subcortical aspect of the posterior right frontoparietal region, consistent with an acute ischemic infarct, right MCA distribution (series 2, image 34). No significant mass effect. No obvious signal changes to suggest associated hemorrhage on this limited exam. Probable additional small acute to early subacute infarct involving the posterior limb of the right internal capsule noted (series 2, image 26).  Otherwise, gray-white matter differentiation  grossly maintained. 14 mm meningioma noted at the left parietal convexity without mass effect. No other mass lesion, mass effect, or midline shift. No hydrocephalus or extra-axial fluid collection.  Vascular: Abnormal flow void within the proximal left V4 segment, likely reflecting slow flow given findings on prior CTA from 08/14/2021. Major intracranial vascular flow voids are otherwise maintained.  Skull and  upper cervical spine: Craniocervical junction not well assessed on this limited study. No visible abnormality about the scalp or calvarium.  Sinuses/Orbits: Globes and orbital soft tissues demonstrate no acute finding. Paranasal sinuses are largely clear. Trace left greater than right mastoid effusions noted  Other: None.  IMPRESSION: 1. Technically limited exam due to the patient's inability to tolerate the full length of the study. DWI and FLAIR sequences only were performed. 2. Scattered acute ischemic infarcts involving the posterior right frontoparietal region, right MCA distribution. No significant mass effect. 3. Probable additional subcentimeter acute to early subacute infarct involving the posterior limb of the right internal capsule. 4. Underlying moderate chronic microvascular ischemic disease with multiple additional chronic ischemic changes as above. 5. 14 mm meningioma at the left parietal convexity without mass effect.   Electronically Signed   By: Jeannine Boga M.D.   On: 10/30/2021 02:59   CT HEAD WO CONTRAST   Narrative   CLINICAL DATA:  Mental status change  EXAM: CT HEAD WITHOUT CONTRAST  TECHNIQUE: Contiguous axial images were obtained from the base of the skull through the vertex without intravenous contrast.  COMPARISON:  MRI 08/15/2021, CT 08/11/2021  FINDINGS: Brain: No acute territorial infarction, hemorrhage or new intracranial mass. Left parietal convexity meningioma on MRI is better seen on MRI. Multiple chronic infarcts involving the right greater than left cerebellum, left basal ganglia, left white matter, and left posteromedial temporal lobe and medial left occipital lobe. Atrophy and chronic small vessel ischemic changes of the white matter. Stable ventricle size.  Vascular: No hyperdense vessels.  No unexpected calcification  Skull: Normal. Negative for fracture or focal lesion.  Sinuses/Orbits: No acute  finding.  Other: None  IMPRESSION: 1. No definite CT evidence for acute intracranial abnormality. 2. Atrophy and chronic small vessel ischemic changes of the white matter. Multiple chronic infarcts as described above.   Electronically Signed   By: Donavan Foil M.D.   On: 10/24/2021 16:39   Results for orders placed or performed during the hospital encounter of 08/11/21  MR BRAIN W CONTRAST   Narrative   CLINICAL DATA:  Presents to the ED 08/11/2021 for confusion found to have acute left PCA infarct. Evaluate for mass lesion.  EXAM: MRI HEAD WITH CONTRAST  TECHNIQUE: Multiplanar, multiecho pulse sequences of the brain and surrounding structures were obtained with intravenous contrast.  CONTRAST:  7.63m GADAVIST GADOBUTROL 1 MMOL/ML IV SOLN  COMPARISON:  Brain MRI 08/13/2021, CTA head/neck 08/14/2021  FINDINGS: Only pre and postcontrast T1 images are included on the current examination.  Brain: There is a homogeneously enhancing extra-axial lesion overlying the left parietal lobe measuring 1.5 cm by 1.3 cm by 1.2 cm consistent with a meningioma. There is no significant mass effect on the underlying brain parenchyma.  There is mild curvilinear cortically based enhancement in the left PCA territory consistent with evolving subacute infarct. There is no other abnormal enhancement.  Remote lacunar infarcts in the left basal ganglia and corona radiata are again seen.  Vascular: Normal flow voids.  Skull and upper cervical spine: Normal marrow signal.  Sinuses/Orbits: The paranasal sinuses appear clear. The globes  and orbits are unremarkable.  Other: None.  IMPRESSION: 1. Homogeneously enhancing extra-axial lesion overlying the left parietal lobe is consistent with a meningioma. 2. Cortically based enhancement in the left PCA territory consistent with evolving subacute infarct.   Electronically Signed   By: Valetta Mole M.D.   On: 08/15/2021 10:29         PHYSICAL EXAM  Physical Exam  Constitutional: Appears well-developed and well-nourished.  Psych: Affect appropriate to situation Eyes: No scleral injection HENT: No OP obstrucion MSK: no joint deformities.  Cardiovascular: Normal rate and regular rhythm.  Respiratory: Effort normal, non-labored breathing GI: Soft.  No distension. There is no tenderness.  Skin: WDI  Neuro: Mental Status: Patient is awake, alert, oriented to person and place. Disoriented to month (December) and year (1974). Patient is able to give a limited history. She does have intermittent confusion and forgetfulness.  No signs of aphasia or neglect  Cranial Nerves: II:  Pupils are equal, round, and reactive to light. Left visual field deficit III,IV, VI: EOMI without ptosis or diploplia.  V: Facial sensation is symmetric VII: Facial movement is symmetric resting and smiling VIII: Hearing is intact to voice X: Palate elevates symmetrically XI: Shoulder shrug is symmetric. XII: Tongue protrudes midline without atrophy or fasciculations.   Motor: RUE: grips  5/5       LUE: grips  3/5      RLE:  knee  5/5   plantar flexion  5/5    dorsiflexion   5/5 LLE:  knee  4/5   plantar flexion   4/5    dorsiflexion   4/5 Tone is normal. Bulk is normal.  Fine motor impairment LUE Sensory: Light touch sensation impaired in LUE and LLE.  Cerebellar: FNF intact  Gait deferred for safety reasons    ASSESSMENT/PLAN Sandra Robinson is a 69 y.o. female with history of previous stroke, COPD, CKD, bipolar disorder, depression presenting via EMS with Altered Mental Status. CT and MRI show multiple scattered acute and chronic infarcts. Previous admission in August for stroke, loop implanted, however patient did not follow with cardiology outpatient. TEE planned for tomorrow (11/01/2021). Patient did have a fall 10/31/2021 with no injury noted.    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  Previous left P3/P4 occlusion now recannulized. MRI head- Scattered acute ischemic infarcts involving the posterior right frontoparietal region, right MCA scattered distribution.  2D Echo  EF 55-60%  BLE venous doppler- no evidence of DVT Loop recorder interrogation no A. Fib EEG- normal limits. TEE pending- scheduled 11/02/2021 LDL 20 HgbA1c 5.1 CRP 0.5, ESR 25 Lovenox for VTE prophylaxis aspirin 81 mg daily prior to admission, now on clopidogrel 75 mg daily and ASA 22m DAPT for 3 weeks and then Plavix alone Patient counseled to be compliant with her antithrombotic medications Ongoing aggressive stroke risk factor management Therapy recommendations - SNF Disposition - pending  History of stroke 07/2021 left PCA infarct and right MCA punctate infarct.  CTA head and neck showed left CCA 60% stenosis, left subclavian artery and left VA high-grade stenosis, left P3/P4 occlusion.  EF 55 to 60%, LDL 106, A1c 5.3, loop recorder placed.  Discharged on DAPT and Lipitor 40.  Hypertension Stable Amlodipine 170mLong-term BP goal normotensive  Hyperlipidemia Home meds:  Atorvastatin 2064mresumed in  hospital LDL 20, goal < 70 Lipitor decreased 10/31/2021   Anxiety, Depression, Bipolar Disorder Psychiatry on board Sertaline 127m Trazadone 5102mBuspirone 1566m Hospital day # 7    Pt seen by NP/Neuro and later by MD. Note/plan to be edited by MD as needed.  DevJanine OresNP-BC [facility]  ATTENDING NOTE: I reviewed above note and agree with the assessment and plan. Pt was seen and examined.   No family at bedside.  Patient lying in bed, awake alert, not orientated to time or age, but orientated to place and self.  Still has mild left hemiparesis, arm more than  leg.  Pending TEE tomorrow for further evaluation.  Continue DAPT and Lipitor.  We will follow  For detailed assessment and plan, please refer to above as I have made changes wherever appropriate.   JinRosalin HawkingD PhD Stroke Neurology 11/01/2021 7:15 PM     To contact Stroke Continuity provider, please refer to [email or web address], contact General Neurology

## 2021-11-01 NOTE — Progress Notes (Signed)
Occupational Therapy Treatment Patient Details Name: Sandra Robinson MRN: [id number] DOB: 29-Oct-1951 Today's Date: 11/01/2021   History of present illness Pt is pleasantly confused 70 y/o F admitted 10/24/21 with acute encephalopathy after having AMS at home. 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 PMH includes CVA, anxiety, bipolar, depression, COPD, CKD, GERD, diverticulosis, hyperlipidemia, hypertension.   OT comments  Pt is slowly progressing towards OT goals. Pt eating dinner when OT arrived and noticed to be having difficulty with fine motor aspects. Observed having some issues scooping food with fork and required assistance to open containers and milk carton. Pt also completed toilet transfer with Mod A, toilet hygiene with supervision, and functional mobility with Mod A. Continues to require extensive cueing for safety, initiation, sequencing, and thoroughness during functional activity. Continue to recommend SNF. Will continue to follow acutely.   Recommendations for follow up therapy are one component of a multi-disciplinary discharge planning process, led by the attending physician.  Recommendations may be updated based on patient status, additional functional criteria and insurance authorization.    Follow Up Recommendations  Skilled nursing-short term rehab (<3 hours/day)    Assistance Recommended at Discharge Frequent or constant Supervision/Assistance  Equipment Recommendations  Other (comment) (Defer)    Recommendations for Other Services      Precautions / Restrictions Precautions Precautions: Fall Restrictions Weight Bearing Restrictions: No       Mobility Bed Mobility Overal bed mobility: Needs Assistance Bed Mobility: Supine to Sit;Sit to Supine     Supine to sit: Mod assist;HOB elevated Sit to supine: Min assist   General bed mobility comments: Good ability to pull self up in bed.    Transfers Overall transfer level: Needs assistance Equipment used:  Rolling walker (2 wheels) Transfers: Sit to/from Stand Sit to Stand: Min assist                 Balance Overall balance assessment: Needs assistance Sitting-balance support: Feet supported;No upper extremity supported Sitting balance-Leahy Scale: Fair     Standing balance support: During functional activity;Reliant on assistive device for balance Standing balance-Leahy Scale: Poor                             ADL either performed or assessed with clinical judgement   ADL Overall ADL's : Needs assistance/impaired Eating/Feeding: Set up;Sitting Eating/Feeding Details (indicate cue type and reason): Requiring assistance with opening containers                     Toilet Transfer: Moderate assistance;Cueing for safety;Cueing for sequencing;Ambulation;Regular Toilet;Rolling walker (2 wheels);Grab bars Toilet Transfer Details (indicate cue type and reason): Frequent cues for safety and technique. Assistance when sitting on toilet for accuracy and when ambulating for proper use of walker. Toileting- Clothing Manipulation and Hygiene: Supervision/safety;Sitting/lateral lean       Functional mobility during ADLs: Moderate assistance;Cueing for safety;Cueing for sequencing;Rolling walker (2 wheels) General ADL Comments: Limited by balance, activity tolerance and cognition. Slow processing and decreased initiation, requiring extended time and extensive cueing during activity.    Extremity/Trunk Assessment              Vision       Perception     Praxis      Cognition Arousal/Alertness: Awake/alert Behavior During Therapy: Flat affect Overall Cognitive Status: Impaired/Different from baseline Area of Impairment: Orientation;Attention;Memory;Following commands;Safety/judgement;Awareness;Problem solving  Orientation Level: Disoriented to;Time;Situation Current Attention Level: Selective Memory: Decreased short-term  memory Following Commands: Follows one step commands inconsistently;Follows one step commands with increased time Safety/Judgement: Decreased awareness of deficits;Decreased awareness of safety Awareness: Intellectual Problem Solving: Slow processing;Decreased initiation;Difficulty sequencing;Requires verbal cues;Requires tactile cues General Comments: Frequent cues for safety during mobility, inconsistently following commands even with repitition. Otherwise in good spirits and willing to participate with OT.          Exercises     Shoulder Instructions       General Comments      Pertinent Vitals/ Pain       Pain Assessment: No/denies pain  Home Living                                          Prior Functioning/Environment              Frequency  Min 2X/week        Progress Toward Goals  OT Goals(current goals can now be found in the care plan section)  Progress towards OT goals: Progressing toward goals  Acute Rehab OT Goals Patient Stated Goal: none stated OT Goal Formulation: Patient unable to participate in goal setting Time For Goal Achievement: 11/09/21 Potential to Achieve Goals: Fair ADL Goals Pt Will Perform Lower Body Bathing: with modified independence Pt Will Perform Lower Body Dressing: with modified independence Pt Will Transfer to Toilet: with modified independence;ambulating;regular height toilet Pt Will Perform Toileting - Clothing Manipulation and hygiene: with modified independence  Plan Discharge plan remains appropriate;Frequency remains appropriate    Co-evaluation                 AM-PAC OT "6 Clicks" Daily Activity     Outcome Measure   Help from another person eating meals?: A Little Help from another person taking care of personal grooming?: A Little Help from another person toileting, which includes using toliet, bedpan, or urinal?: A Lot Help from another person bathing (including washing, rinsing,  drying)?: A Little Help from another person to put on and taking off regular upper body clothing?: A Little Help from another person to put on and taking off regular lower body clothing?: A Little 6 Click Score: 17    End of Session Equipment Utilized During Treatment: Gait belt;Rolling walker (2 wheels)  OT Visit Diagnosis: Unsteadiness on feet (R26.81);Other abnormalities of gait and mobility (R26.89);Muscle weakness (generalized) (M62.81);Other symptoms and signs involving cognitive function   Activity Tolerance Patient tolerated treatment well   Patient Left in bed;with call bell/phone within reach;with nursing/sitter in room   Nurse Communication Mobility status        Time: 6468-0321 OT Time Calculation (min): 28 min  Charges: OT General Charges $OT Visit: 1 Visit OT Treatments $Self Care/Home Management : 23-37 mins  Kolyn Rozario C, OT/L  Acute Rehab [place] 11/01/2021, 5:35 PM

## 2021-11-01 NOTE — Progress Notes (Signed)
Nutrition Follow-up  DOCUMENTATION CODES:   Non-severe (moderate) malnutrition in context of chronic illness  INTERVENTION:  -continue Ensure Enlive po BID, each supplement provides 350 kcal and 20 grams of protein -continue MVI with minerals daily -continue to encourage PO intake  NUTRITION DIAGNOSIS:   Moderate Malnutrition related to chronic illness (COPD) as evidenced by moderate muscle depletion, mild fat depletion, percent weight loss.  ongoing  GOAL:   Patient will meet greater than or equal to 90% of their needs  progressing  MONITOR:   PO intake, Weight trends, Supplement acceptance, Labs  REASON FOR ASSESSMENT:   Consult Assessment of nutrition requirement/status  ASSESSMENT:   70 y.o. female presented to the ED with altered mental statues. Pt recently admitted 10/23 also for altered mental status caused by UTI. PMH includes COPD, CKD, bipolar, diverticulosis, GERD, HTN, and a stroke back in August. Pt admitted with acute encephalopathy and unknown cause.  MRI on 11/13 revealed multiple acute infarcts, neurology consulted. Pt to have TEE today. Plan for SNF once medically stable for discharge. Note MD feels pt with probable diagnosis of dementia as well.   PO Intake: 75-100% x last 8 recorded meals (94% avg meal intake)  Per RN, pt typically does well with supplements. Continue current nutrition plan of care at this time.    UOP: 675ml x24 hours I/O: +1340ml since admit  Medications reviewed and include: MVI, Protonix, Ensure Enlive/Plus BID Labs reviewed.    Diet Order:   Diet Order             Diet regular Room service appropriate? Yes; Fluid consistency: Thin  Diet effective now                   EDUCATION NEEDS:   No education needs have been identified at this time  Skin:  Skin Assessment: Reviewed RN Assessment  Last BM:  11/14  Height:   Ht Readings from Last 1 Encounters:  08/16/21 5\' 3"  (1.6 m)    Weight:   Wt Readings  from Last 1 Encounters:  10/26/21 70.5 kg    Ideal Body Weight:  52.3 kg  BMI:  Body mass index is 27.53 kg/m.  Estimated Nutritional Needs:   Kcal:  2000-2200  Protein:  100-115 grams  Fluid:  > 2 L     Tila Millirons A., MS, RD, LDN (she/her/hers) RD pager number and weekend/on-call pager number located in [place].

## 2021-11-01 NOTE — Progress Notes (Signed)
    [facility] has been requested to perform a transesophageal echocardiogram on Ms. Sandra Robinson for CVA.    Patient initially admitted for AMS. She is A& O x 4 during my evaluation. Able to answer questions appropriately.   After careful review of history and examination, the risks and benefits of transesophageal echocardiogram have been explained including risks of esophageal damage, perforation (1:10,000 risk), bleeding, pharyngeal hematoma as well as other potential complications associated with conscious sedation including aspiration, arrhythmia, respiratory failure and death. Alternatives to treatment were discussed, questions were answered. Patient is willing to proceed.  TEE - Dr. Harl Bowie @ 2pm 11/02/21. NPO after midnight. Meds with sips.   If any sign of confusion in morning, Please reach out to card master. Need to obtain consent from DPR person then.   Leanor Kail, PA-C 11/01/2021 6:54 PM

## 2021-11-01 NOTE — Progress Notes (Signed)
Physical Therapy Treatment Patient Details Name: Sandra Robinson MRN: [id number] DOB: 11-16-1951 Today's Date: 11/01/2021   History of Present Illness Pt is pleasantly confused 70 y/o F admitted 10/24/21 with acute encephalopathy after having AMS at home. 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 PMH includes CVA, anxiety, bipolar, depression, COPD, CKD, GERD, diverticulosis, hyperlipidemia, hypertension.    PT Comments    Patient progressing slowly towards PT goals. Continues to be confused exhibiting poor awareness of deficits/safety. Also disoriented to time/situation despite being told a few mins prior this information. Requires Mod A for gait training and assist with LLE due to weakness/advancement esp when fatigued. Continues to be a high fall risk due to cognitive deficits as well as weakness/impaired balance. Recommend SNF. Will follow.   Recommendations for follow up therapy are one component of a multi-disciplinary discharge planning process, led by the attending physician.  Recommendations may be updated based on patient status, additional functional criteria and insurance authorization.  Follow Up Recommendations  Skilled nursing-short term rehab (<3 hours/day)     Assistance Recommended at Discharge Frequent or constant Supervision/Assistance  Equipment Recommendations  None recommended by PT    Recommendations for Other Services       Precautions / Restrictions Precautions Precautions: Fall Restrictions Weight Bearing Restrictions: No     Mobility  Bed Mobility Overal bed mobility: Needs Assistance Bed Mobility: Supine to Sit;Sit to Supine     Supine to sit: Mod assist;HOB elevated Sit to supine: Min assist   General bed mobility comments: Assist with trunk and LEs to get EOB, cues for sequencing.    Transfers Overall transfer level: Needs assistance Equipment used: None;Rolling walker (2 wheels) Transfers: Sit to/from Stand;Bed to chair/wheelchair/BSC Sit to  Stand: Min assist Stand pivot transfers: Min assist         General transfer comment: Assist to power to standing from EOB x1, SPT bed to [facility] with Min A for balance and stood from [facility] x1, Declined transfer to chair. Flexed posture noted.    Ambulation/Gait Ambulation/Gait assistance: Mod assist Gait Distance (Feet): 30 Feet Assistive device: Rolling walker (2 wheels) Gait Pattern/deviations: Step-to pattern;Step-through pattern;Decreased step length - left;Decreased stance time - left;Decreased stride length;Decreased weight shift to left Gait velocity: decreased     General Gait Details: Pt with paretic-like gait, dificulty advancing LLE esp when fatigued dragging it behind her. Cues for RW proximity as pt pushing RW too far anterior. Increased knee flexion bilaterally throughout gait cycle worsened with fatigue.   Stairs             Wheelchair Mobility    Modified Rankin (Stroke Patients Only) Modified Rankin (Stroke Patients Only) Pre-Morbid Rankin Score: Moderate disability Modified Rankin: Moderately severe disability     Balance Overall balance assessment: Needs assistance Sitting-balance support: Feet supported;No upper extremity supported Sitting balance-Leahy Scale: Fair     Standing balance support: During functional activity;Reliant on assistive device for balance Standing balance-Leahy Scale: Poor Standing balance comment: reliant on external support                            Cognition Arousal/Alertness: Awake/alert Behavior During Therapy: Flat affect Overall Cognitive Status: Impaired/Different from baseline Area of Impairment: Orientation;Attention;Memory;Following commands;Safety/judgement;Awareness;Problem solving                 Orientation Level: Disoriented to;Time;Situation Current Attention Level: Selective Memory: Decreased short-term memory Following Commands: Follows one step commands consistently;Follows one step  commands with increased time (repetition) Safety/Judgement: Decreased awareness of deficits;Decreased awareness of safety Awareness: Intellectual Problem Solving: Slow processing;Difficulty sequencing;Requires verbal cues General Comments: Oriented to place but not to time (Dec" 1973) despite beingtold a few mins prior by tech those answers. Slow processing. Flat affect, Requires repetition to follow commands, poor awareness of deficits/safety.        Exercises      General Comments        Pertinent Vitals/Pain Pain Assessment: No/denies pain    Home Living                          Prior Function            PT Goals (current goals can now be found in the care plan section) Progress towards PT goals: Progressing toward goals (slowly)    Frequency    Min 3X/week      PT Plan Current plan remains appropriate    Co-evaluation              AM-PAC PT "6 Clicks" Mobility   Outcome Measure  Help needed turning from your back to your side while in a flat bed without using bedrails?: A Little Help needed moving from lying on your back to sitting on the side of a flat bed without using bedrails?: A Lot Help needed moving to and from a bed to a chair (including a wheelchair)?: A Little Help needed standing up from a chair using your arms (e.g., wheelchair or bedside chair)?: A Little Help needed to walk in hospital room?: A Lot Help needed climbing 3-5 steps with a railing? : A Lot 6 Click Score: 15    End of Session Equipment Utilized During Treatment: Gait belt Activity Tolerance: Patient limited by fatigue Patient left: in bed;with call bell/phone within reach;with bed alarm set;with nursing/sitter in room Nurse Communication: Mobility status PT Visit Diagnosis: Unsteadiness on feet (R26.81);Other abnormalities of gait and mobility (R26.89);Muscle weakness (generalized) (M62.81);Other symptoms and signs involving the nervous system (R29.898)      Time: 7262-0355 PT Time Calculation (min) (ACUTE ONLY): 22 min  Charges:  $Therapeutic Activity: 8-22 mins                     Marisa Severin, PT, DPT Acute Rehabilitation Services Pager [phone] Office [phone]      Marguarite Arbour A Sabra Heck 11/01/2021, 10:53 AM

## 2021-11-02 ENCOUNTER — Inpatient Hospital Stay (HOSPITAL_COMMUNITY): Payer: Medicare HMO

## 2021-11-02 ENCOUNTER — Inpatient Hospital Stay (HOSPITAL_COMMUNITY): Payer: Medicare HMO | Admitting: Certified Registered Nurse Anesthetist

## 2021-11-02 ENCOUNTER — Encounter (HOSPITAL_COMMUNITY)
Admission: EM | Disposition: A | Discharge status: Skilled Nursing Facility | Payer: Self-pay | Source: Home / Self Care | Attending: Internal Medicine

## 2021-11-02 ENCOUNTER — Encounter (HOSPITAL_COMMUNITY): Payer: Self-pay | Admitting: Internal Medicine

## 2021-11-02 DIAGNOSIS — I639 Cerebral infarction, unspecified: Secondary | ICD-10-CM

## 2021-11-02 DIAGNOSIS — E538 Deficiency of other specified B group vitamins: Secondary | ICD-10-CM

## 2021-11-02 DIAGNOSIS — Z8673 Personal history of transient ischemic attack (TIA), and cerebral infarction without residual deficits: Secondary | ICD-10-CM | POA: Diagnosis not present

## 2021-11-02 DIAGNOSIS — I340 Nonrheumatic mitral (valve) insufficiency: Secondary | ICD-10-CM | POA: Diagnosis not present

## 2021-11-02 DIAGNOSIS — I6340 Cerebral infarction due to embolism of unspecified cerebral artery: Secondary | ICD-10-CM | POA: Diagnosis not present

## 2021-11-02 DIAGNOSIS — F0390 Unspecified dementia without behavioral disturbance: Secondary | ICD-10-CM | POA: Diagnosis not present

## 2021-11-02 DIAGNOSIS — F3170 Bipolar disorder, currently in remission, most recent episode unspecified: Secondary | ICD-10-CM | POA: Diagnosis not present

## 2021-11-02 HISTORY — PX: TEE WITHOUT CARDIOVERSION: SHX5443

## 2021-11-02 HISTORY — PX: BUBBLE STUDY: SHX6837

## 2021-11-02 LAB — RESP PANEL BY RT-PCR (FLU A&B, COVID) ARPGX2
Influenza A by PCR: NEGATIVE
Influenza B by PCR: NEGATIVE
SARS Coronavirus 2 by RT PCR: NEGATIVE

## 2021-11-02 LAB — GLUCOSE, CAPILLARY: Glucose-Capillary: 212 mg/dL — ABNORMAL HIGH (ref 70–99)

## 2021-11-02 SURGERY — ECHOCARDIOGRAM, TRANSESOPHAGEAL
Anesthesia: Monitor Anesthesia Care

## 2021-11-02 MED ORDER — VITAMIN B-12 1000 MCG PO TABS: 1000.0000 ug | ORAL_TABLET | Freq: Every day | ORAL | Status: DC

## 2021-11-02 MED ORDER — PROPOFOL 10 MG/ML IV BOLUS
INTRAVENOUS | Status: DC | PRN
  Administered 2021-11-02: 10 mg via INTRAVENOUS
  Administered 2021-11-02: 30 mg via INTRAVENOUS

## 2021-11-02 MED ORDER — PROPOFOL 500 MG/50ML IV EMUL
INTRAVENOUS | Status: DC | PRN
  Administered 2021-11-02: 100 ug/kg/min via INTRAVENOUS

## 2021-11-02 MED ORDER — VITAMIN B-12 1000 MCG PO TABS
1000.0000 ug | ORAL_TABLET | Freq: Every day | ORAL | Status: DC
  Administered 2021-11-02 – 2021-11-07 (×6): 1000 ug via ORAL
  Filled 2021-11-02 (×6): qty 1

## 2021-11-02 MED ORDER — PHENYLEPHRINE 40 MCG/ML (10ML) SYRINGE FOR IV PUSH (FOR BLOOD PRESSURE SUPPORT)
PREFILLED_SYRINGE | INTRAVENOUS | Status: DC | PRN
  Administered 2021-11-02: 200 ug via INTRAVENOUS
  Administered 2021-11-02: 80 ug via INTRAVENOUS

## 2021-11-02 MED ORDER — BUTAMBEN-TETRACAINE-BENZOCAINE 2-2-14 % EX AERO
INHALATION_SPRAY | CUTANEOUS | Status: DC | PRN
  Administered 2021-11-02: 2 via TOPICAL

## 2021-11-02 MED ORDER — SODIUM CHLORIDE 0.9 % IV SOLN: INTRAVENOUS | Status: DC

## 2021-11-02 MED ORDER — CYANOCOBALAMIN 1000 MCG/ML IJ SOLN
1000.0000 ug | Freq: Once | INTRAMUSCULAR | Status: DC
  Filled 2021-11-02: qty 1

## 2021-11-02 MED ORDER — LIDOCAINE 2% (20 MG/ML) 5 ML SYRINGE
INTRAMUSCULAR | Status: DC | PRN
  Administered 2021-11-02: 60 mg via INTRAVENOUS

## 2021-11-02 NOTE — Anesthesia Preprocedure Evaluation (Addendum)
Anesthesia Evaluation  Patient identified by MRN, date of birth, ID band Patient awake    Reviewed: Allergy & Precautions, NPO status , Patient's Chart, lab work & pertinent test results  Airway Mallampati: III  TM Distance: >3 FB Neck ROM: Full    Dental  (+) Dental Advisory Given, Edentulous Upper   Pulmonary COPD, Current Smoker and Patient abstained from smoking.,    Pulmonary exam normal breath sounds clear to auscultation       Cardiovascular hypertension, Normal cardiovascular exam Rhythm:Regular Rate:Normal     Neuro/Psych PSYCHIATRIC DISORDERS Anxiety Depression Bipolar Disorder Dementia CVA    GI/Hepatic Neg liver ROS, GERD  Medicated,  Endo/Other  negative endocrine ROS  Renal/GU Renal InsufficiencyRenal disease     Musculoskeletal negative musculoskeletal ROS (+)   Abdominal   Peds  Hematology negative hematology ROS (+)   Anesthesia Other Findings Day of surgery medications reviewed with the patient.  Reproductive/Obstetrics                            Anesthesia Physical Anesthesia Plan  ASA: 3  Anesthesia Plan: MAC   Post-op Pain Management:    Induction: Intravenous  PONV Risk Score and Plan: 1 and Treatment may vary due to age or medical condition and Propofol infusion  Airway Management Planned: Nasal Cannula  Additional Equipment:   Intra-op Plan:   Post-operative Plan:   Informed Consent: I have reviewed the patients History and Physical, chart, labs and discussed the procedure including the risks, benefits and alternatives for the proposed anesthesia with the patient or authorized representative who has indicated his/her understanding and acceptance.     Dental advisory given  Plan Discussed with: CRNA and Anesthesiologist  Anesthesia Plan Comments:         Anesthesia Quick Evaluation

## 2021-11-02 NOTE — Progress Notes (Signed)
STROKE TEAM PROGRESS NOTE   SUBJECTIVE (INTERVAL HISTORY) No family present at this time. Patient is resting comfortably in bed.  Patient had TEE today showed no PFO, no thrombus.  B12 = 28, will give B12 supplement.  We will check hypercoagulable labs.   OBJECTIVE Vitals:   11/02/21 1418 11/02/21 1428 11/02/21 1516 11/02/21 1558  BP: (!) 114/52 (!) 134/54 (!) 122/103 127/77  Pulse: 77 75 84 95  Resp: $Remo'15 18 18 16  'QTMTP$ Temp:   97.8 F (36.6 C) 98 F (36.7 C)  TempSrc:   Oral Oral  SpO2: 96% 97% 97% 97%  Weight:        CBC:  Recent Labs  Lab 10/30/21 1142  WBC 9.0  NEUTROABS 5.6  HGB 13.7  HCT 42.0  MCV 89.7  PLT 852    Basic Metabolic Panel:  Recent Labs  Lab 10/27/21 0329 10/30/21 1205  NA 137 138  K 4.3 4.7  CL 108 105  CO2 26 21*  GLUCOSE 87 158*  BUN 15 22  CREATININE 0.83 1.04*  CALCIUM 8.6* 9.5  MG 2.3  --     Lipid Panel:  Recent Labs  Lab 10/31/21 0228  CHOL 108  TRIG 165*  HDL 55  CHOLHDL 2.0  VLDL 33  LDLCALC 20   HgbA1c:  Lab Results  Component Value Date   HGBA1C 5.1 10/31/2021   Urine Drug Screen:     Component Value Date/Time   LABOPIA NONE DETECTED 08/15/2021 2345   COCAINSCRNUR NONE DETECTED 08/15/2021 2345   LABBENZ NONE DETECTED 08/15/2021 2345   AMPHETMU NONE DETECTED 08/15/2021 2345   THCU NONE DETECTED 08/15/2021 2345   LABBARB NONE DETECTED 08/15/2021 2345    Alcohol Level     Component Value Date/Time   ETH <10 08/11/2021 1803    IMAGING CT ANGIO HEAD NECK W WO CM  Result Date: 10/31/2021 CLINICAL DATA:  Follow-up examination for acute stroke. EXAM: CT ANGIOGRAPHY HEAD AND NECK TECHNIQUE: Multidetector CT imaging of the head and neck was performed using the standard protocol during bolus administration of intravenous contrast. Multiplanar CT image reconstructions and MIPs were obtained to evaluate the vascular anatomy. Carotid stenosis measurements (when applicable) are obtained utilizing NASCET criteria, using the  distal internal carotid diameter as the denominator. CONTRAST:  57mL OMNIPAQUE IOHEXOL 350 MG/ML SOLN COMPARISON:  Prior MRI from 10/29/2021 as well as prior CTA from 08/14/2021. FINDINGS: CT HEAD FINDINGS Brain: Cerebral atrophy with chronic microvascular ischemic disease. Remote lacunar infarcts noted about the left basal ganglia. Few scattered remote bilateral cerebellar infarcts. Evolving late subacute left PCA distribution infarct again noted. Previously identified acute right posterior MCA distribution infarct grossly stable from prior MRI, better characterized on that examination. No associated mass effect or hemorrhage. No other acute large vessel territory infarct or intracranial hemorrhage. 14 mm meningioma overlying the left parietal convexity again noted. No other mass lesion, mass effect or midline shift. No hydrocephalus or extra-axial fluid collection. Vascular: No hyperdense vessel. Scattered vascular calcifications noted within the carotid siphons. Skull: Scalp soft tissues demonstrate no acute finding. Calvarium intact. Sinuses: Visualized paranasal sinuses are clear. Trace bilateral mastoid effusions noted, of doubtful significance. Orbits: Left gaze noted. Globes and orbital soft tissues otherwise unremarkable. Review of the MIP images confirms the above findings CTA NECK FINDINGS Aortic arch: Visualized arch normal caliber with normal 3 vessel morphology. Moderate atheromatous disease about the arch and origin of the great vessels, stable. Right carotid system: Right CCA tortuous proximally but remains  widely patent to the bifurcation. Eccentric bulky calcified plaque at the proximal right ICA without hemodynamically significant stenosis, stable. Right ICA remains patent distally to the skull base without new stenosis or dissection. Left carotid system: Atheromatous plaque at the origin of the left CCA with stenosis measuring up to 40% by NASCET criteria. Left CCA otherwise patent distally.  Additional mild eccentric plaque at the left carotid bulb without significant stenosis. Left ICA remains patent distally without progressive stenosis or dissection. Vertebral arteries: Both vertebral arteries arise from the subclavian arteries. Right vertebral artery dominant and remains patent to the skull base without stenosis or dissection. Atheromatous plaque about the proximal left subclavian artery with estimated 70% stenosis (series 12, image 275), stable. Hypoplastic left vertebral artery remains patent proximally, but slowly occludes at the V2/V3 segment overall appearance is relatively stable from prior. Skeleton: No visible acute osseous finding. No discrete or worrisome osseous lesions. Patient is edentulous. Mild for age spondylosis. Other neck: No other acute soft tissue abnormality within the neck. Upper chest: Visualized upper chest demonstrates no acute finding. Right shoulder arthroplasty noted. Review of the MIP images confirms the above findings CTA HEAD FINDINGS Anterior circulation: Both internal carotid arteries remain patent to the termini. Mild plaque within the siphons without significant stenosis, stable. A1 segments remain patent, with the right being slightly dominant. Normal anterior communicating artery complex. Both ACAs remain patent to their distal aspects. No new M1 stenosis or occlusion. Normal MCA bifurcations. Distal MCA branches remain perfused and stable from previous. Posterior circulation: Dominant right vertebral artery remains patent to the vertebrobasilar junction without stenosis. Distal reconstitution of the left V3 and V4 segments, stable from prior. Focal moderate proximal left V4 stenosis prior to the takeoff of the left PICA, stable (series 12, image 138). Left PICA remains patent. Basilar remains patent to its distal aspect without new or progressive stenosis. Superior cerebellar arteries remain patent bilaterally. Both PCAs supplied via hypoplastic P1 segments and  prominent bilateral posterior communicating arteries. Right PCA remains patent proximally, with multifocal moderate to severe distal right P3 and P4 stenoses, stable. On the left, again seen is moderate multifocal left P2 stenoses. There has likely been interval recanalization of previously seen distal left P3/P4 stenosis, although severe atheromatous disease seen throughout the distal left PCA branches distally. Venous sinuses: Grossly patent allowing for timing the contrast bolus. Anatomic variants: None significant. No new aneurysm. Left parietal meningioma again noted. Review of the MIP images confirms the above findings IMPRESSION: CT HEAD IMPRESSION: 1. Continued interval evolution of small acute posterior right MCA distribution infarct, stable from prior MRI. No interval hemorrhage or other complication. 2. Underlying atrophy with advanced chronic microvascular ischemic disease with multiple additional chronic ischemic infarcts as above, otherwise stable. 3. 14 mm left parietal meningioma without edema or mass effect. CTA HEAD AND NECK IMPRESSION: 1. Relatively stable CTA of the head and neck since 08/14/2021. No new large vessel occlusion. 2. 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 3. Probable interval recanalization of previously seen distal left P3/P4 occlusion, although there is severe underlying atheromatous disease throughout the underlying distal PCA branches bilaterally. 4.  Aortic Atherosclerosis (ICD10-I70.0). Electronically Signed   By: Jeannine Boga M.D.   On: 10/31/2021 05:44   CT HEAD WO CONTRAST  Result Date: 10/24/2021 CLINICAL DATA:  Mental status change EXAM: CT HEAD WITHOUT CONTRAST TECHNIQUE: Contiguous axial images were obtained from the base of the skull  through the vertex without intravenous contrast. COMPARISON:  MRI 08/15/2021, CT 08/11/2021 FINDINGS: Brain: No acute territorial infarction, hemorrhage or new intracranial mass. Left parietal convexity meningioma on MRI is better seen on MRI. Multiple chronic infarcts involving the right greater than left cerebellum, left basal ganglia, left white matter, and left posteromedial temporal lobe and medial left occipital lobe. Atrophy and chronic small vessel ischemic changes of the white matter. Stable ventricle size. Vascular: No hyperdense vessels.  No unexpected calcification Skull: Normal. Negative for fracture or focal lesion. Sinuses/Orbits: No acute finding. Other: None IMPRESSION: 1. No definite CT evidence for acute intracranial abnormality. 2. Atrophy and chronic small vessel ischemic changes of the white matter. Multiple chronic infarcts as described above. Electronically Signed   By: Donavan Foil M.D.   On: 10/24/2021 16:39   MR BRAIN WO CONTRAST  Result Date: 10/30/2021 CLINICAL DATA:  Follow-up examination for acute stroke, left-sided weakness. EXAM: MRI HEAD WITHOUT CONTRAST TECHNIQUE: Multiplanar, multiecho pulse sequences of the brain and surrounding structures were obtained without intravenous contrast. COMPARISON:  Prior CT from 10/24/2021. FINDINGS: Brain: Examination is technically limited as the patient was unable to tolerate the full length of the study. Diffusion-weighted sequences and axial FLAIR sequence only were performed. Cerebral volume within normal limits for age. Patchy and confluent T2/FLAIR hyperintensity involving the periventricular deep white matter both cerebral hemispheres most consistent with chronic small vessel ischemic disease, moderately advanced in nature. Few scattered remote lacunar infarcts present about the bilateral basal ganglia and left corona radiata. Additional remote lacunar infarcts present at the right pons and mid cerebellum. Few additional scattered  remote cerebellar infarcts noted peripherally. Encephalomalacia and gliosis involving the parasagittal left occipital lobe with mild residual diffusion signal, likely an evolving late subacute to chronic left PCA distribution infarct. Scattered patchy foci of restricted diffusion involving the cortical and subcortical aspect of the posterior right frontoparietal region, consistent with an acute ischemic infarct, right MCA distribution (series 2, image 34). No significant mass effect. No obvious signal changes to suggest associated hemorrhage on this limited exam. Probable additional small acute to early subacute infarct involving the posterior limb of the right internal capsule noted (series 2, image 26). Otherwise, gray-white matter differentiation grossly maintained. 14 mm meningioma noted at the left parietal convexity without mass effect. No other mass lesion, mass effect, or midline shift. No hydrocephalus or extra-axial fluid collection. Vascular: Abnormal flow void within the proximal left V4 segment, likely reflecting slow flow given findings on prior CTA from 08/14/2021. Major intracranial vascular flow voids are otherwise maintained. Skull and upper cervical spine: Craniocervical junction not well assessed on this limited study. No visible abnormality about the scalp or calvarium. Sinuses/Orbits: Globes and orbital soft tissues demonstrate no acute finding. Paranasal sinuses are largely clear. Trace left greater than right mastoid effusions noted Other: None. IMPRESSION: 1. Technically limited exam due to the patient's inability to tolerate the full length of the study. DWI and FLAIR sequences only were performed. 2. Scattered acute ischemic infarcts involving the posterior right frontoparietal region, right MCA distribution. No significant mass effect. 3. Probable additional subcentimeter acute to early subacute infarct involving the posterior limb of the right internal capsule. 4. Underlying moderate  chronic microvascular ischemic disease with multiple additional chronic ischemic changes as above. 5. 14 mm meningioma at the left parietal convexity without mass effect. Electronically Signed   By:  Jeannine Boga M.D.   On: 10/30/2021 02:59   DG Chest Port 1 View  Result Date: 10/24/2021 CLINICAL DATA:  Altered mental status EXAM: PORTABLE CHEST 1 VIEW COMPARISON:  08/11/2021 FINDINGS: Recording device over left chest. Cardiomegaly. No focal opacity, pleural effusion or pneumothorax. Partially visualized right shoulder replacement IMPRESSION: No active disease.  Cardiomegaly Electronically Signed   By: Donavan Foil M.D.   On: 10/24/2021 19:42   Overnight EEG with video  Result Date: 10/31/2021 Lora Havens, MD     10/31/2021  3:30 PM Patient Name: Sandra Robinson MRN: [id number] Epilepsy Attending: Lora Havens Referring Physician/Provider: Gardiner Barefoot, NP Duration: 10/30/2021  1500 to 10/31/2021 1453 Patient history: 70 yo female who was admitted 5 days ago with AMS, however, noted to be confused for a few weeks.  EEG to evaluate for seizure. Level of alertness: Awake, asleep AEDs during EEG study: GBP Technical aspects: This EEG study was done with scalp electrodes positioned according to the 10-20 International system of electrode placement. Electrical activity was acquired at a sampling rate of $Remov'500Hz'jWDGOP$  and reviewed with a high frequency filter of $RemoveB'70Hz'qnQCJBEl$  and a low frequency filter of $RemoveB'1Hz'yikkekmi$ . EEG data were recorded continuously and digitally stored. Description: The posterior dominant rhythm consists of 8 Hz activity of moderate voltage (25-35 uV) seen predominantly in posterior head regions, symmetric and reactive to eye opening and eye closing. Sleep was characterized by vertex waves, sleep spindles (12 to 14 Hz), maximal frontocentral region. Hyperventilation and photic stimulation were not performed.   IMPRESSION: This study is within normal limits. No seizures or epileptiform  discharges were seen throughout the recording. Lora Havens   ECHO TEE  Result Date: 11/02/2021    TRANSESOPHOGEAL ECHO REPORT   Patient Name:   Sandra Robinson Date of Exam: 11/02/2021 Medical Rec #:  [id number]      Height:       63.0 in Accession #:    [id number]     Weight:       155.4 lb Date of Birth:  Jun 17, 1951      BSA:          1.737 m Patient Age:    70 years       BP:           143/78 mmHg Patient Gender: F              HR:           84 bpm. Exam Location:  Inpatient Procedure: Transesophageal Echo Indications:     Stroke  History:         Patient has prior history of Echocardiogram examinations, most                  recent 10/30/2021.  Sonographer:     Merrie Roof RDCS Referring Phys:  [id number] [facility] Diagnosing Phys: [place] PROCEDURE: After discussion of the risks and benefits of a TEE, an informed consent was obtained from the patient. The transesophogeal probe was passed without difficulty through the esophogus of the patient. Local oropharyngeal anesthetic was provided with Cetacaine. Sedation performed by different physician. The patient was monitored while under deep sedation. The patient developed no complications during the procedure. IMPRESSIONS  1. Left ventricular ejection fraction, by estimation, is 60 to 65%. The left ventricle has normal function.  2. Right ventricular systolic function is normal. The right ventricular size is normal.  3. No left atrial/left atrial appendage thrombus was  detected. The LAA emptying velocity was 60 cm/s.  4. The mitral valve is grossly normal. Mild mitral valve regurgitation.  5. The aortic valve is normal in structure. Aortic valve regurgitation is not visualized. No aortic stenosis is present.  6. Agitated saline contrast bubble study was negative, with no evidence of any interatrial shunt. Conclusion(s)/Recommendation(s): No LA/LAA thrombus identified. Negative bubble study for interatrial shunt. No intracardiac source of embolism  detected on this on this transesophageal echocardiogram. FINDINGS  Left Ventricle: Left ventricular ejection fraction, by estimation, is 60 to 65%. The left ventricle has normal function. The left ventricular internal cavity size was normal in size. Right Ventricle: The right ventricular size is normal. No increase in right ventricular wall thickness. Right ventricular systolic function is normal. Left Atrium: Left atrial size was normal in size. No left atrial/left atrial appendage thrombus was detected. The LAA emptying velocity was 60 cm/s. Right Atrium: Right atrial size was not well visualized. Pericardium: There is no evidence of pericardial effusion. Mitral Valve: The mitral valve is grossly normal. Mild mitral valve regurgitation. Tricuspid Valve: The tricuspid valve is normal in structure. Tricuspid valve regurgitation is not demonstrated. Aortic Valve: The aortic valve is normal in structure. Aortic valve regurgitation is not visualized. No aortic stenosis is present. Pulmonic Valve: The pulmonic valve was normal in structure. Pulmonic valve regurgitation is not visualized. Aorta: The aortic root and ascending aorta are structurally normal, with no evidence of dilitation. IAS/Shunts: The interatrial septum appears to be lipomatous. No atrial level shunt detected by color flow Doppler. Agitated saline contrast was given intravenously to evaluate for intracardiac shunting. Agitated saline contrast bubble study was negative,  with no evidence of any interatrial shunt. Phineas Inches Electronically signed by Phineas Inches Signature Date/Time: 11/02/2021/2:50:51 PM    Final    ECHOCARDIOGRAM COMPLETE BUBBLE STUDY  Result Date: 10/30/2021    ECHOCARDIOGRAM REPORT   Patient Name:   Sandra Robinson Date of Exam: 10/30/2021 Medical Rec #:  [id number]      Height:       63.0 in Accession #:    [id number]     Weight:       155.4 lb Date of Birth:  11-19-1951      BSA:          1.737 m Patient Age:    32 years       BP:            155/77 mmHg Patient Gender: F              HR:           87 bpm. Exam Location:  Inpatient Procedure: 2D Echo, Cardiac Doppler and Color Doppler Indications:    Stroke, bubble  History:        Patient has prior history of Echocardiogram examinations, most                 recent 08/14/2021. COPD and Stroke; Risk Factors:Hyperlipidemia                 and Hypertension.  Sonographer:    Glo Herring Referring Phys: [place]  1. Left ventricular ejection fraction, by estimation, is 60 to 65%. The left ventricle has normal function. The left ventricle has no regional wall motion abnormalities. Left ventricular diastolic parameters are consistent with Grade I diastolic dysfunction (impaired relaxation). Elevated left ventricular end-diastolic pressure.  2. Right ventricular systolic function is normal. The right ventricular size is  normal. Tricuspid regurgitation signal is inadequate for assessing PA pressure.  3. The mitral valve is grossly normal. Moderate mitral valve regurgitation. The mean mitral valve gradient is 3.0 mmHg.  4. The aortic valve is tricuspid. Aortic valve regurgitation is not visualized. No aortic stenosis is present. Aortic valve mean gradient measures 5.0 mmHg.  5. The inferior vena cava is normal in size with greater than 50% respiratory variability, suggesting right atrial pressure of 3 mmHg.  6. Agitated saline contrast bubble study was negative, with no evidence of any interatrial shunt. Comparison(s): Prior images reviewed side by side. Mitral regurgitation is moderate. FINDINGS  Left Ventricle: Left ventricular ejection fraction, by estimation, is 60 to 65%. The left ventricle has normal function. The left ventricle has no regional wall motion abnormalities. The left ventricular internal cavity size was normal in size. There is  no left ventricular hypertrophy. Left ventricular diastolic parameters are consistent with Grade I diastolic dysfunction  (impaired relaxation). Elevated left ventricular end-diastolic pressure. Right Ventricle: The right ventricular size is normal. No increase in right ventricular wall thickness. Right ventricular systolic function is normal. Tricuspid regurgitation signal is inadequate for assessing PA pressure. Left Atrium: Left atrial size was normal in size. Right Atrium: Right atrial size was normal in size. Pericardium: There is no evidence of pericardial effusion. Presence of epicardial fat layer. Mitral Valve: The mitral valve is grossly normal. Moderate mitral valve regurgitation. MV peak gradient, 6.9 mmHg. The mean mitral valve gradient is 3.0 mmHg. Tricuspid Valve: The tricuspid valve is grossly normal. Tricuspid valve regurgitation is trivial. Aortic Valve: The aortic valve is tricuspid. There is mild aortic valve annular calcification. Aortic valve regurgitation is not visualized. No aortic stenosis is present. Aortic valve mean gradient measures 5.0 mmHg. Aortic valve peak gradient measures 9.5 mmHg. Aortic valve area, by VTI measures 1.98 cm. Pulmonic Valve: The pulmonic valve was grossly normal. Pulmonic valve regurgitation is trivial. Aorta: The aortic root is normal in size and structure. Venous: The inferior vena cava is normal in size with greater than 50% respiratory variability, suggesting right atrial pressure of 3 mmHg. IAS/Shunts: No atrial level shunt detected by color flow Doppler. Agitated saline contrast was given intravenously to evaluate for intracardiac shunting. Agitated saline contrast bubble study was negative, with no evidence of any interatrial shunt.  LEFT VENTRICLE PLAX 2D LVIDd:         4.40 cm   Diastology LV PW:         1.00 cm   LV e' medial:    4.62 cm/s LV IVS:        0.90 cm   LV E/e' medial:  24.2 LVOT diam:     1.90 cm   LV e' lateral:   4.22 cm/s LV SV:         50        LV E/e' lateral: 26.5 LV SV Index:   29 LVOT Area:     2.84 cm  RIGHT VENTRICLE             IVC RV Basal diam:   3.30 cm     IVC diam: 1.70 cm RV S prime:     13.20 cm/s LEFT ATRIUM             Index        RIGHT ATRIUM           Index LA Vol (A2C):   50.5 ml 29.07 ml/m  RA Area:     13.30  cm LA Vol (A4C):   45.0 ml 25.90 ml/m  RA Volume:   25.40 ml  14.62 ml/m LA Biplane Vol: 48.6 ml 27.98 ml/m  AORTIC VALVE                     PULMONIC VALVE AV Area (Vmax):    1.91 cm      PV Vmax:       1.03 m/s AV Area (Vmean):   1.82 cm      PV Peak grad:  4.2 mmHg AV Area (VTI):     1.98 cm AV Vmax:           154.00 cm/s AV Vmean:          104.000 cm/s AV VTI:            0.255 m AV Peak Grad:      9.5 mmHg AV Mean Grad:      5.0 mmHg LVOT Vmax:         104.00 cm/s LVOT Vmean:        66.600 cm/s LVOT VTI:          0.178 m LVOT/AV VTI ratio: 0.70  AORTA Ao Root diam: 2.90 cm Ao Asc diam:  3.00 cm MITRAL VALVE MV Area (PHT): 3.72 cm     SHUNTS MV Area VTI:   1.64 cm     Systemic VTI:  0.18 m MV Peak grad:  6.9 mmHg     Systemic Diam: 1.90 cm MV Mean grad:  3.0 mmHg MV Vmax:       1.31 m/s MV Vmean:      79.7 cm/s MV Decel Time: 204 msec MV E velocity: 112.00 cm/s MV A velocity: 143.00 cm/s MV E/A ratio:  0.78 Rozann Lesches MD Electronically signed by Rozann Lesches MD Signature Date/Time: 10/30/2021/3:46:05 PM    Final    VAS [place] LOWER EXTREMITY VENOUS (DVT)  Result Date: 10/31/2021  Lower Venous DVT Study Patient Name:  Sandra Robinson  Date of Exam:   10/31/2021 Medical Rec #: [id number]       Accession #:    [id number] Date of Birth: July 31, 1951       Patient Gender: F Patient Age:   14 years Exam Location:  [facility] Procedure:      VAS [place] LOWER EXTREMITY VENOUS (DVT) Referring Phys: Cornelius Moras Hung Rhinesmith --------------------------------------------------------------------------------  Indications: Stroke.  Risk Factors: None identified. Comparison Study: No prior studies. Performing Technologist: Oliver Hum RVT  Examination Guidelines: A complete evaluation includes B-mode imaging, spectral Doppler, color Doppler, and  power Doppler as needed of all accessible portions of each vessel. Bilateral testing is considered an integral part of a complete examination. Limited examinations for reoccurring indications may be performed as noted. The reflux portion of the exam is performed with the patient in reverse Trendelenburg.  +---------+---------------+---------+-----------+----------+--------------+ RIGHT    CompressibilityPhasicitySpontaneityPropertiesThrombus Aging +---------+---------------+---------+-----------+----------+--------------+ CFV      Full           Yes      Yes                                 +---------+---------------+---------+-----------+----------+--------------+ SFJ      Full                                                        +---------+---------------+---------+-----------+----------+--------------+  FV Prox  Full                                                        +---------+---------------+---------+-----------+----------+--------------+ FV Mid   Full                                                        +---------+---------------+---------+-----------+----------+--------------+ FV DistalFull                                                        +---------+---------------+---------+-----------+----------+--------------+ PFV      Full                                                        +---------+---------------+---------+-----------+----------+--------------+ POP      Full           Yes      Yes                                 +---------+---------------+---------+-----------+----------+--------------+ PTV      Full                                                        +---------+---------------+---------+-----------+----------+--------------+ PERO     Full                                                        +---------+---------------+---------+-----------+----------+--------------+    +---------+---------------+---------+-----------+----------+--------------+ LEFT     CompressibilityPhasicitySpontaneityPropertiesThrombus Aging +---------+---------------+---------+-----------+----------+--------------+ CFV      Full           Yes      Yes                                 +---------+---------------+---------+-----------+----------+--------------+ SFJ      Full                                                        +---------+---------------+---------+-----------+----------+--------------+ FV Prox  Full                                                        +---------+---------------+---------+-----------+----------+--------------+  FV Mid   Full                                                        +---------+---------------+---------+-----------+----------+--------------+ FV DistalFull                                                        +---------+---------------+---------+-----------+----------+--------------+ PFV      Full                                                        +---------+---------------+---------+-----------+----------+--------------+ POP      Full           Yes      Yes                                 +---------+---------------+---------+-----------+----------+--------------+ PTV      Full                                                        +---------+---------------+---------+-----------+----------+--------------+ PERO     Full                                                        +---------+---------------+---------+-----------+----------+--------------+     Summary: RIGHT: - There is no evidence of deep vein thrombosis in the lower extremity.  - No cystic structure found in the popliteal fossa.  LEFT: - There is no evidence of deep vein thrombosis in the lower extremity.  - No cystic structure found in the popliteal fossa.  *See table(s) above for measurements and observations. Electronically signed  by Monica Martinez MD on 10/31/2021 at 4:41:19 PM.    Final      PHYSICAL EXAM  Physical Exam  Constitutional: Appears well-developed and well-nourished.  Psych: Affect appropriate to situation Eyes: No scleral injection HENT: No OP obstrucion MSK: no joint deformities.  Cardiovascular: Normal rate and regular rhythm.  Respiratory: Effort normal, non-labored breathing GI: Soft.  No distension. There is no tenderness.  Skin: WDI  Neuro: Mental Status: Patient is awake, alert, oriented to person and place. Disoriented to month (December) and year (1974). Patient is able to give a limited history. She does have intermittent confusion and forgetfulness.  No signs of aphasia or neglect  Cranial Nerves: II:  Pupils are equal, round, and reactive to light. Left visual field deficit III,IV, VI: EOMI without ptosis or diploplia.  V: Facial sensation is symmetric VII: Facial movement is symmetric resting and smiling VIII: Hearing is intact to voice X: Palate elevates symmetrically XI: Shoulder shrug is symmetric. XII: Tongue protrudes midline  without atrophy or fasciculations.   Motor: RUE: grips  5/5       LUE: grips  3/5      RLE:  knee  5/5   plantar flexion  5/5    dorsiflexion   5/5 LLE:  knee  4/5   plantar flexion   4/5    dorsiflexion   4/5 Tone is normal. Bulk is normal.  Fine motor impairment LUE Sensory: Light touch sensation impaired in LUE and LLE.  Cerebellar: FNF intact  Gait deferred for safety reasons    ASSESSMENT/PLAN Ms. Sandra Robinson is a 70 y.o. female with history of previous stroke, COPD, CKD, bipolar disorder, depression presenting via EMS with Altered Mental Status. CT and MRI show multiple scattered acute and chronic infarcts. Previous admission in August for stroke, loop implanted, however patient did not follow with cardiology outpatient. TEE planned for tomorrow (11/01/2021). Patient did have a fall 10/31/2021 with no injury noted.    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  Previous left P3/P4 occlusion now recannulized. MRI head- Scattered acute ischemic infarcts involving the posterior right frontoparietal region, right MCA scattered distribution.  2D Echo  EF 55-60%  BLE venous doppler- no evidence of DVT Loop recorder interrogation no A. Fib EEG- normal limits. TEE unremarkable, no PFO, no LV thrombus Hypercoagulable work-up pending LDL 20 HgbA1c 5.1 CRP 0.5, ESR 25 Lovenox for VTE prophylaxis aspirin 81 mg daily prior to admission, now on clopidogrel 75 mg daily and ASA $Remov'81mg'xWWayT$  DAPT for 3 weeks and then Plavix alone Patient counseled to be compliant with her antithrombotic medications Ongoing aggressive stroke risk factor management Therapy recommendations - SNF Disposition - pending  History of stroke 07/2021 left PCA infarct and right MCA punctate infarct.  CTA head and neck showed left CCA 60% stenosis, left subclavian artery and left VA high-grade stenosis, left P3/P4 occlusion.  EF 55 to 60%, LDL 106, A1c 5.3, loop recorder placed.  Discharged on DAPT and Lipitor 40.  Hypertension Stable Amlodipine $RemoveBeforeDEI'10mg'UZftpnULjHVLfqlC$  Long-term BP goal normotensive  Hyperlipidemia Home meds:  Atorvastatin $RemoveBefor'20mg'LiBduoovDbsh$ , resumed in hospital LDL 20, goal < 70 Lipitor decreased 10/31/2021   B12 deficiency B12 = 208 B12 supplement Will check homocystine level, prothrombin gene, and MTHFR DNA analysis  Anxiety, Depression, Bipolar Disorder Psychiatry on board Sertaline $RemoveBeforeD'100mg'qQVhcvXRwHJxcw$  Trazadone $RemoveBe'50mg'SolaVqmMt$  Buspirone $RemoveBe'15mg'pxSZkPGix$    Hospital day # 8  Neurology will sign off. Please call with questions. Pt will follow up with stroke clinic Dr. Leonie Man at [facility] in about 4 weeks. Thanks for the consult.   Rosalin Hawking, MD PhD Stroke  Neurology 11/02/2021 5:38 PM     To contact Stroke Continuity provider, please refer to [email or web address] After hours, contact General Neurology

## 2021-11-02 NOTE — Anesthesia Procedure Notes (Signed)
Procedure Name: MAC Date/Time: 11/02/2021 1:46 PM Performed by: Dorthea Cove, CRNA Pre-anesthesia Checklist: Patient identified, Emergency Drugs available, Suction available, Patient being monitored and Timeout performed Patient Re-evaluated:Patient Re-evaluated prior to induction Oxygen Delivery Method: Nasal cannula Preoxygenation: Pre-oxygenation with 100% oxygen Induction Type: IV induction Placement Confirmation: positive ETCO2 and CO2 detector Dental Injury: Teeth and Oropharynx as per pre-operative assessment

## 2021-11-02 NOTE — Interval H&P Note (Signed)
History and Physical Interval Note:  11/02/2021 1:29 PM  Sandra Robinson  has presented today for surgery, with the diagnosis of STROKE.  The various methods of treatment have been discussed with the patient and family. After consideration of risks, benefits and other options for treatment, the patient has consented to  Procedure(s): TRANSESOPHAGEAL ECHOCARDIOGRAM (TEE) (N/A) as a surgical intervention.  The patient's history has been reviewed, patient examined, no change in status, stable for surgery.  I have reviewed the patient's chart and labs.  Questions were answered to the patient's satisfaction.     [name]

## 2021-11-02 NOTE — Transfer of Care (Signed)
Immediate Anesthesia Transfer of Care Note  Patient: Sandra Robinson  Procedure(s) Performed: TRANSESOPHAGEAL ECHOCARDIOGRAM (TEE) BUBBLE STUDY  Patient Location: Endoscopy Unit  Anesthesia Type:MAC  Level of Consciousness: awake, alert  and oriented  Airway & Oxygen Therapy: Patient Spontanous Breathing  Post-op Assessment: Report given to RN and Post -op Vital signs reviewed and stable  Post vital signs: Reviewed and stable  Last Vitals:  Vitals Value Taken Time  BP 116/65 11/02/21 1404  Temp    Pulse 85 11/02/21 1406  Resp 20 11/02/21 1406  SpO2 95 % 11/02/21 1406  Vitals shown include unvalidated device data.  Last Pain:  Vitals:   11/02/21 1308  TempSrc: Temporal  PainSc: 0-No pain      Patients Stated Pain Goal: 0 (07/31/87 7195)  Complications: No notable events documented.

## 2021-11-02 NOTE — Progress Notes (Signed)
Mobility Specialist Criteria Algorithm Info.   Mobility Team:    11/02/21 1045  Mobility  Activity Ambulated in room;Dangled on edge of bed (LE exercises)  Range of Motion/Exercises Active;All extremities  Level of Assistance Minimal assist, patient does 75% or more (min/mod A)  Assistive Device Front wheel walker  Distance Ambulated (ft) 25 ft  Mobility Ambulated with assistance in room  Mobility Response Tolerated well  Mobility performed by Mobility specialist  Bed Position Semi-fowlers      Patient called out requesting assistance to get OOB. Required min-mod A to the EOB, stood quickly without properly following cues for hand placement. Ambulated short distance in room and returned back to bed. Completed LE exercises upon sitting EOB. Was left lying supine in bed with all needs met, call bell in reach, and alarm set.   11/02/2021 2:39 PM

## 2021-11-02 NOTE — Anesthesia Postprocedure Evaluation (Signed)
Anesthesia Post Note  Patient: Johnny Bridge  Procedure(s) Performed: TRANSESOPHAGEAL ECHOCARDIOGRAM (TEE) BUBBLE STUDY     Patient location during evaluation: Endoscopy Anesthesia Type: MAC Level of consciousness: awake and alert Pain management: pain level controlled Vital Signs Assessment: post-procedure vital signs reviewed and stable Respiratory status: spontaneous breathing, nonlabored ventilation and respiratory function stable Cardiovascular status: stable and blood pressure returned to baseline Postop Assessment: no apparent nausea or vomiting Anesthetic complications: no   No notable events documented.  Last Vitals:  Vitals:   11/02/21 1408 11/02/21 1418  BP: 116/65 (!) 114/52  Pulse: 81 77  Resp: 18 15  Temp: (!) 35.7 C   SpO2: 95% 96%    Last Pain:  Vitals:   11/02/21 1418  TempSrc:   PainSc: 0-No pain                 Catalina Gravel

## 2021-11-02 NOTE — Progress Notes (Signed)
PROGRESS NOTE  ORRA Robinson  DOB: 03-May-1951  PCP: No primary care provider on file. WLN:[id number]  DOA: 10/24/2021  LOS: 8 days  Hospital Day: 10  Chief complaint: Altered mental status  Brief narrative: Sandra Robinson is a 70 y.o. female with PMH significant for HTN, HLD, prediabetes, stroke in August 2022, CKD, COPD, bipolar disorder, depression, chronic low back pain. Patient was brought to the ED on 11/7 with altered mental status.  Per family, for several months, patient has been physically and mentally declining.  She has not taking her mental health medications, wandering in the neighborhood, not eating well, losing weight and unable to take care of herself. She was admitted to hospital service Met adjusted. Psych meds adjusted.  PT eval was obtained.  SNF recommended. While waiting for placement, on 11/13, she had an MRI of head done which showed multiple acute infarcts.  Stroke work-up was subsequently done. Underwent TEE 11/15.  No evidence of PFO or left atrial appendage thrombus.   -- 11/13 MRI revealed multiple acute infarcts.  Neurology consultation pending.  Plan for SNF.  Loop recorder interrogation unremarkable.  EEG unremarkable thus far.  --11/15 TEE planned 11/16.  Anticipate stability for transfer to SNF thereafter.  Mentation improved today.  Subjective: Patient was seen and examined this morning.  Pleasant elderly female.  Lying on bed.  Not in distress.  Alert, awake, oriented to place only.  Underwent TEE this afternoon.  Assessment/Plan: Acute CVA -MRI on 11/13 showed multiple acute infarcts.  Stroke work-up was completed. -Loop recorder interpretation did not show any evidence of A. fib. -11/15, patient underwent TEE which did not show any PFO or left atrial appendage thrombus. -EEG unremarkable -Prior to admission, patient was on aspirin 81 mg daily.  Per neurology recommendation, patient is currently on aspirin 81 mg daily and Plavix 75 mg daily for 3  weeks followed by Plavix alone. -LDL low.  Statin dose reduced. -SNF recommended by PT eval.  Toxic encephalopathy Wandering behavior -Delirium secondary to stroke and combination of worsening dementia.    Bipolar affective disorder Depression/anxiety -Per grandson this is longstanding and patient has been on medication for many years, apparently not taking this medication appropriately in the outpatient setting or at least questionable.  Seen by psychiatry, not felt to have signs or symptoms of decompensated bipolar disorder.  Recommendation was to continue gabapentin with up titration as indicated, Buspar, trazodone, Zoloft.   COPD -Appears stable, continue bronchodilators   Failure to thrive Moderate malnutrition -With significant weight loss per family. Encourage oral appetite. -Continue Multivitamin w/ minerals daily, Ensure Enlive po BID    Mixed hyperlipidemia -Continue statin, but at lower dose given low LDL   Mobility: PT eval obtained Living condition: Was living at home Goals of care:   Code Status: Full Code  Nutritional status: Body mass index is 27.53 kg/m. Nutrition Problem: Moderate Malnutrition Etiology: chronic illness (COPD) Signs/Symptoms: moderate muscle depletion, mild fat depletion, percent weight loss Percent weight loss: 12 % Diet:  Diet Order             Diet regular Room service appropriate? Yes; Fluid consistency: Thin  Diet effective now                  DVT prophylaxis:  enoxaparin (LOVENOX) injection 40 mg Start: 10/25/21 1800   Antimicrobials: None Fluid: None Consultants: Neurology, cardiology Family Communication: None at bedside  Status is: Inpatient  Remains inpatient appropriate because: Pending SNF  Dispo:  The patient is from: Home              Anticipated d/c is to: SNF              Patient currently is not medically stable to d/c.   Difficult to place patient No     Infusions:    Scheduled Meds:  amLODipine   10 mg Oral Daily   aspirin EC  81 mg Oral Daily   atorvastatin  20 mg Oral QHS   busPIRone  15 mg Oral BID   clopidogrel  75 mg Oral Daily   enoxaparin (LOVENOX) injection  40 mg Subcutaneous Q24H   feeding supplement  237 mL Oral BID BM   gabapentin  300 mg Oral BID   mometasone-formoterol  2 puff Inhalation BID   multivitamins with iron  1 tablet Oral Daily   pantoprazole  40 mg Oral Daily   sertraline  100 mg Oral q morning   sodium chloride flush  3 mL Intravenous Once   traZODone  50 mg Oral QHS    PRN meds: acetaminophen **OR** acetaminophen, albuterol, melatonin   Antimicrobials: Anti-infectives (From admission, onward)    Start     Dose/Rate Route Frequency Ordered Stop   10/25/21 1645  valACYclovir (VALTREX) tablet 1,000 mg  Status:  Discontinued       Note to Pharmacy: For 8 days     1,000 mg Oral Daily 10/25/21 1558 10/25/21 1729   10/24/21 2200  cefTRIAXone (ROCEPHIN) 1 g in sodium chloride 0.9 % 100 mL IVPB  Status:  Discontinued        1 g 200 mL/hr over 30 Minutes Intravenous Every 24 hours 10/24/21 2059 10/25/21 1559       Objective: Vitals:   11/02/21 1516 11/02/21 1558  BP: (!) 122/103 127/77  Pulse: 84 95  Resp: 18 16  Temp: 97.8 F (36.6 C) 98 F (36.7 C)  SpO2: 97% 97%    Intake/Output Summary (Last 24 hours) at 11/02/2021 1652 Last data filed at 11/02/2021 1407 Gross per 24 hour  Intake 300 ml  Output 400 ml  Net -100 ml   Filed Weights   10/26/21 1347  Weight: 70.5 kg   Weight change:  Body mass index is 27.53 kg/m.   Physical Exam: General exam: Pleasant, elderly female.  Not in physical distress Skin: No rashes, lesions or ulcers. HEENT: Atraumatic, normocephalic, no obvious bleeding Lungs: Clear to auscultation bilaterally CVS: Regular rate and rhythm, no murmur GI/Abd soft, nontender, nondistended, bowel sound present CNS: Alert, awake, oriented to place Psychiatry: Mood appropriate Extremities: No pedal edema, no calf  redness  Data Review: I have personally reviewed the laboratory data and studies available.  F/u labs ordered Unresulted Labs (From admission, onward)     Start     Ordered   11/02/21 1614  Resp Panel by RT-PCR (Flu A&B, Covid) Nasopharyngeal Swab  (Tier 2 - Symptomatic/asymptomatic)  Once,   R        11/02/21 1613   10/30/21 0823  Vitamin B6  Once,   R       Question:  Specimen collection method  Answer:  Lab=Lab collect   10/30/21 0824   10/30/21 0823  Vitamin B1  Once,   R       Question:  Specimen collection method  Answer:  Lab=Lab collect   10/30/21 9470            Signed, Terrilee Croak, MD [facility] 11/02/2021

## 2021-11-02 NOTE — TOC Progression Note (Addendum)
Transition of Care [facility]) - Progression Note    Patient Details  Name: Sandra Robinson MRN: [id number] Date of Birth: Aug 26, 1951  Transition of Care [facility] CM/SW Contact  Joanne Chars, LCSW Phone Number: 11/02/2021, 11:47 AM  Clinical Narrative:   CSW brought medicare.gov choice document to pt room along with bed offers.  Pt asked CSW to speak with her son.  CSW attempted to call son, no answer, left message.   1400: CSW spoke with son [name] who is requesting placement at [facility].   1500: Confirmed with Juliann Pulse at [facility], pt may have past due balance and she is checking on this.  1530: Juliann Pulse can accept pt.  Per [place], they do not manage this policy.  [facility] will move forward with auth.     Expected Discharge Plan: [place] Barriers to Discharge: Continued Medical Work up  Expected Discharge Plan and Services Expected Discharge Plan: [place] In-house Referral: Clinical Social Work     Living arrangements for the past 2 months: Single Family Home                                       Social Determinants of Health (SDOH) Interventions    Readmission Risk Interventions No flowsheet data found.

## 2021-11-02 NOTE — Progress Notes (Signed)
  Echocardiogram 2D Echocardiogram has been performed.  Sandra Robinson F 11/02/2021, 2:33 PM

## 2021-11-02 NOTE — CV Procedure (Signed)
INDICATIONS: Stroke  PROCEDURE:   Informed consent was obtained prior to the procedure. The risks, benefits and alternatives for the procedure were discussed and the patient comprehended these risks.  Risks include, but are not limited to, cough, sore throat, vomiting, nausea, somnolence, esophageal and stomach trauma or perforation, bleeding, low blood pressure, aspiration, pneumonia, infection, trauma to the teeth and death.    After a procedural time-out, the oropharynx was anesthetized with 20% benzocaine spray.   During this procedure the patient was administered propofol to achieve and maintain moderate conscious sedation.  The patient's heart rate, blood pressure, and oxygen saturationweare monitored continuously during the procedure. The period of conscious sedation was 20 minutes, of which I was present face-to-face 100% of this time.  The transesophageal probe was inserted in the esophagus and stomach without difficulty and multiple views were obtained.  The patient was kept under observation until the patient left the procedure room.  The patient left the procedure room in stable condition.   Agitated microbubble saline contrast was administered.  COMPLICATIONS:    There were no immediate complications.  FINDINGS:  No evidence of PFO No left atrial appendage thrombus  RECOMMENDATIONS:     No recommended changes  Time Spent Directly with the Patient:  20 minutes   [name] 11/02/2021, 2:07 PM

## 2021-11-03 DIAGNOSIS — I6340 Cerebral infarction due to embolism of unspecified cerebral artery: Secondary | ICD-10-CM | POA: Diagnosis not present

## 2021-11-03 LAB — LUPUS ANTICOAGULANT PANEL
DRVVT: 24.9 s (ref 0.0–47.0)
PTT Lupus Anticoagulant: 25.7 s (ref 0.0–51.9)

## 2021-11-03 LAB — VITAMIN B1: Vitamin B1 (Thiamine): 215 nmol/L — ABNORMAL HIGH (ref 66.5–200.0)

## 2021-11-03 NOTE — Progress Notes (Signed)
PT Cancellation Note  Patient Details Name: Sandra Robinson MRN: [id number] DOB: March 16, 1951   Cancelled Treatment:    Reason Eval/Treat Not Completed: Patient declined, no reason specified - pt refuses OOB, states "I want to be left alone, I don't have to do anything". PT to check back.   Stacie Glaze, PT DPT Acute Rehabilitation Services Pager [phone]  Office [phone]    Louis Matte 11/03/2021, 2:54 PM

## 2021-11-03 NOTE — Progress Notes (Signed)
Mobility Specialist Criteria Algorithm Info.  HOB elevated:HOB 30 Activity: Ambulated in room; Ambulated to bathroom Range of motion: Active; All extremities Level of assistance: Minimal assist, patient does 75% or more Assistive device: Front wheel walker Distance ambulated (ft): 25 ft Mobility response: Tolerated well Bed Position: Semi-fowlers  Patient received lying supine in bed, yelling out for assistance to restroom. Pt was soiled and had already voided in bed but was still requesting assistance to restroom for BM. Required mod A to EOB and cues for safe hand placement to stand. Ambulated to bathroom, bed and gown was changed and then pt returned back to bed. Declined further ambulation. Was left lying in bed with all needs met, call bell in reach, and alarm set.  11/03/2021 11:24 AM

## 2021-11-03 NOTE — TOC Progression Note (Signed)
Transition of Care [facility]) - Progression Note    Patient Details  Name: Sandra Robinson MRN: [id number] Date of Birth: 12-08-1951  [facility] CM/SW Contact  Joanne Chars, LCSW Phone Number: 11/03/2021, 4:08 PM  Clinical Narrative:  CSW spoke with Juliann Pulse at [facility], insurance auth not back yet.       Expected Discharge Plan: [place] Barriers to Discharge: Continued Medical Work up  Expected Discharge Plan and Services Expected Discharge Plan: [place] In-house Referral: Clinical Social Work     Living arrangements for the past 2 months: Single Family Home                                       Social Determinants of Health (SDOH) Interventions    Readmission Risk Interventions No flowsheet data found.

## 2021-11-03 NOTE — Progress Notes (Signed)
PROGRESS NOTE  Sandra Robinson  DOB: [birth year]  PCP: No primary care provider on file. KLK:[id number]  DOA: 10/24/2021  LOS: 9 days  Hospital Day: 11  Chief complaint: Altered mental status  Brief narrative: Sandra Robinson is a 70 y.o. female with PMH significant for HTN, HLD, prediabetes, stroke in August 2022, CKD, COPD, bipolar disorder, depression, chronic low back pain. Patient was brought to the ED on 11/7 with altered mental status.  Per family, for several months, patient has been physically and mentally declining.  She has not taking her mental health medications, wandering in the neighborhood, not eating well, losing weight and unable to take care of herself. She was admitted to hospital service Met adjusted. Psych meds adjusted.  PT eval was obtained.  SNF recommended. While waiting for placement, on 11/13, she had an MRI of head done which showed multiple acute infarcts.  Stroke work-up was subsequently done. Underwent TEE 11/15.  No evidence of PFO or left atrial appendage thrombus. Pending placement at this time.  Subjective: Patient was seen and examined this morning.   Lying on bed.  Not in distress.  Alert, awake, demented.  Slow to respond.  Not restless or agitation.   Pending placement.    Assessment/Plan: Acute CVA -MRI on 11/13 showed multiple acute infarcts.  Stroke work-up was completed. -Loop recorder interpretation did not show any evidence of A. fib. -11/15, patient underwent TEE which did not show any PFO or left atrial appendage thrombus. -EEG unremarkable -Prior to admission, patient was on aspirin 81 mg daily.  Per neurology recommendation, patient is currently on aspirin 81 mg daily and Plavix 75 mg daily for 3 weeks followed by Plavix alone. -LDL low.  Statin dose reduced. -SNF recommended by PT eval. -Neurology follow-up appreciated.  Hypercoagulable work-up sent.  Toxic encephalopathy Wandering behavior -Delirium secondary to stroke and  combination of worsening dementia.    Bipolar affective disorder Depression/anxiety -Per grandson this is longstanding and patient has been on medication for many years, apparently not taking this medication appropriately in the outpatient setting or at least questionable.  Seen by psychiatry, not felt to have signs or symptoms of decompensated bipolar disorder.  Recommendation was to continue gabapentin with up titration as indicated, Buspar, trazodone, Zoloft.   COPD -Appears stable, continue bronchodilators   Failure to thrive Moderate malnutrition -With significant weight loss per family. Encourage oral appetite. -Continue Multivitamin w/ minerals daily, Ensure Enlive po BID    Mixed hyperlipidemia -Continue statin, but at lower dose given low LDL   Mobility: PT eval obtained Living condition: Was living at home Goals of care:   Code Status: Full Code  Nutritional status: Body mass index is 27.53 kg/m. Nutrition Problem: Moderate Malnutrition Etiology: chronic illness (COPD) Signs/Symptoms: moderate muscle depletion, mild fat depletion, percent weight loss Percent weight loss: 12 % Diet:  Diet Order             Diet regular Room service appropriate? Yes; Fluid consistency: Thin  Diet effective now                  DVT prophylaxis:  enoxaparin (LOVENOX) injection 40 mg Start: 10/25/21 1800   Antimicrobials: None Fluid: None Consultants: Neurology, cardiology Family Communication: None at bedside  Status is: Inpatient  Remains inpatient appropriate because: Pending SNF  Dispo: The patient is from: Home              Anticipated d/c is to: SNF  Patient currently is medically stable to d/c.   Difficult to place patient No     Infusions:    Scheduled Meds:  amLODipine  10 mg Oral Daily   aspirin EC  81 mg Oral Daily   atorvastatin  20 mg Oral QHS   busPIRone  15 mg Oral BID   clopidogrel  75 mg Oral Daily   enoxaparin (LOVENOX) injection   40 mg Subcutaneous Q24H   feeding supplement  237 mL Oral BID BM   gabapentin  300 mg Oral BID   mometasone-formoterol  2 puff Inhalation BID   multivitamins with iron  1 tablet Oral Daily   pantoprazole  40 mg Oral Daily   sertraline  100 mg Oral q morning   sodium chloride flush  3 mL Intravenous Once   traZODone  50 mg Oral QHS   vitamin B-12  1,000 mcg Oral Daily    PRN meds: acetaminophen **OR** acetaminophen, albuterol, melatonin   Antimicrobials: Anti-infectives (From admission, onward)    Start     Dose/Rate Route Frequency Ordered Stop   10/25/21 1645  valACYclovir (VALTREX) tablet 1,000 mg  Status:  Discontinued       Note to Pharmacy: For 8 days     1,000 mg Oral Daily 10/25/21 1558 10/25/21 1729   10/24/21 2200  cefTRIAXone (ROCEPHIN) 1 g in sodium chloride 0.9 % 100 mL IVPB  Status:  Discontinued        1 g 200 mL/hr over 30 Minutes Intravenous Every 24 hours 10/24/21 2059 10/25/21 1559       Objective: Vitals:   11/03/21 0531 11/03/21 0742  BP: (!) 168/88 (!) 160/78  Pulse: 79 80  Resp: 17 15  Temp: (!) 97.5 F (36.4 C) (!) 97.5 F (36.4 C)  SpO2: 99% 94%    Intake/Output Summary (Last 24 hours) at 11/03/2021 1029 Last data filed at 11/02/2021 1900 Gross per 24 hour  Intake 640 ml  Output --  Net 640 ml   Filed Weights   10/26/21 1347  Weight: 70.5 kg   Weight change:  Body mass index is 27.53 kg/m.   Physical Exam: General exam: Pleasant, elderly female.  Not in physical distress Skin: No rashes, lesions or ulcers. HEENT: Atraumatic, normocephalic, no obvious bleeding Lungs: Clear to auscultation bilaterally CVS: Regular rate and rhythm, no murmur GI/Abd soft, nontender, nondistended, bowel sound present CNS: Alert, awake, oriented to place only. Psychiatry: Mood appropriate Extremities: No pedal edema, no calf redness  Data Review: I have personally reviewed the laboratory data and studies available.  F/u labs ordered Unresulted  Labs (From admission, onward)     Start     Ordered   11/02/21 1726  MTHFR DNA Analysis  Once,   R       Question:  Specimen collection method  Answer:  Lab=Lab collect   11/02/21 1725   11/02/21 1724  Lupus anticoagulant panel  (Hypercoagulable Panel, Comprehensive (PNL))  Once,   R       Question:  Specimen collection method  Answer:  Lab=Lab collect   11/02/21 1725   11/02/21 1724  Beta-2-glycoprotein i abs, IgG/M/A  (Hypercoagulable Panel, Comprehensive (PNL))  Once,   R       Question:  Specimen collection method  Answer:  Lab=Lab collect   11/02/21 1725   11/02/21 1724  Homocysteine, serum  (Hypercoagulable Panel, Comprehensive (PNL))  Once,   R       Question:  Specimen collection method  Answer:  Lab=Lab collect   11/02/21 1725   11/02/21 1724  Prothrombin gene mutation  (Hypercoagulable Panel, Comprehensive (PNL))  Once,   R       Question:  Specimen collection method  Answer:  Lab=Lab collect   11/02/21 1725   11/02/21 1724  Cardiolipin antibodies, IgG, IgM, IgA  (Hypercoagulable Panel, Comprehensive (PNL))  Once,   R       Question:  Specimen collection method  Answer:  Lab=Lab collect   11/02/21 1725   11/02/21 1724  Phosphatidylserine antibodies  Once,   R       Question:  Specimen collection method  Answer:  Lab=Lab collect   11/02/21 1725   11/02/21 1724  ANA, IFA (with reflex)  Once,   R       Question:  Specimen collection method  Answer:  Lab=Lab collect   11/02/21 1725   10/30/21 0823  Vitamin B6  Once,   R       Question:  Specimen collection method  Answer:  Lab=Lab collect   10/30/21 0824   10/30/21 0823  Vitamin B1  Once,   R       Question:  Specimen collection method  Answer:  Lab=Lab collect   10/30/21 7793            Signed, Terrilee Croak, MD [facility] 11/03/2021

## 2021-11-04 ENCOUNTER — Encounter (HOSPITAL_COMMUNITY): Payer: Self-pay | Admitting: Internal Medicine

## 2021-11-04 LAB — CBC WITH DIFFERENTIAL/PLATELET
Abs Immature Granulocytes: 0.06 10*3/uL (ref 0.00–0.07)
Basophils Absolute: 0.1 10*3/uL (ref 0.0–0.1)
Basophils Relative: 1 %
Eosinophils Absolute: 0.3 10*3/uL (ref 0.0–0.5)
Eosinophils Relative: 3 %
HCT: 38.4 % (ref 36.0–46.0)
Hemoglobin: 12.8 g/dL (ref 12.0–15.0)
Immature Granulocytes: 1 %
Lymphocytes Relative: 26 %
Lymphs Abs: 2.6 10*3/uL (ref 0.7–4.0)
MCH: 29.8 pg (ref 26.0–34.0)
MCHC: 33.3 g/dL (ref 30.0–36.0)
MCV: 89.3 fL (ref 80.0–100.0)
Monocytes Absolute: 0.8 10*3/uL (ref 0.1–1.0)
Monocytes Relative: 8 %
Neutro Abs: 6.1 10*3/uL (ref 1.7–7.7)
Neutrophils Relative %: 61 %
Platelets: 268 10*3/uL (ref 150–400)
RBC: 4.3 MIL/uL (ref 3.87–5.11)
RDW: 14.6 % (ref 11.5–15.5)
WBC: 9.9 10*3/uL (ref 4.0–10.5)
nRBC: 0 % (ref 0.0–0.2)

## 2021-11-04 LAB — MAGNESIUM: Magnesium: 1.9 mg/dL (ref 1.7–2.4)

## 2021-11-04 LAB — BASIC METABOLIC PANEL
Anion gap: 5 (ref 5–15)
BUN: 23 mg/dL (ref 8–23)
CO2: 24 mmol/L (ref 22–32)
Calcium: 9.3 mg/dL (ref 8.9–10.3)
Chloride: 110 mmol/L (ref 98–111)
Creatinine, Ser: 1.06 mg/dL — ABNORMAL HIGH (ref 0.44–1.00)
GFR, Estimated: 57 mL/min — ABNORMAL LOW (ref >60)
Glucose, Bld: 135 mg/dL — ABNORMAL HIGH (ref 70–99)
Potassium: 3.7 mmol/L (ref 3.5–5.1)
Sodium: 139 mmol/L (ref 135–145)

## 2021-11-04 LAB — VITAMIN B6

## 2021-11-04 MED ORDER — ASPIRIN 81 MG PO TBEC
81.0000 mg | DELAYED_RELEASE_TABLET | Freq: Every day | ORAL | 11 refills | Status: AC
Start: 2021-11-05 — End: 2021-11-21

## 2021-11-04 MED ORDER — TRAZODONE HCL 50 MG PO TABS
50.0000 mg | ORAL_TABLET | Freq: Every day | ORAL | Status: AC
Start: 2021-11-04 — End: ?

## 2021-11-04 MED ORDER — MELATONIN 10 MG PO TABS: 10.0000 mg | ORAL_TABLET | Freq: Every evening | ORAL | 0 refills | Status: AC | PRN

## 2021-11-04 MED ORDER — GABAPENTIN 300 MG PO CAPS: 300.0000 mg | ORAL_CAPSULE | Freq: Two times a day (BID) | ORAL | Status: AC

## 2021-11-04 MED ORDER — CLOPIDOGREL BISULFATE 75 MG PO TABS
75.0000 mg | ORAL_TABLET | Freq: Every day | ORAL | Status: AC
Start: 2021-11-05 — End: ?

## 2021-11-04 MED ORDER — TAB-A-VITE/IRON PO TABS: 1.0000 | ORAL_TABLET | Freq: Every day | ORAL | 0 refills | Status: AC

## 2021-11-04 MED ORDER — POTASSIUM CHLORIDE CRYS ER 20 MEQ PO TBCR
40.0000 meq | EXTENDED_RELEASE_TABLET | Freq: Once | ORAL | Status: AC
  Administered 2021-11-04: 40 meq via ORAL
  Filled 2021-11-04: qty 2

## 2021-11-04 MED ORDER — ENSURE ENLIVE PO LIQD: 237.0000 mL | Freq: Two times a day (BID) | ORAL | 12 refills | Status: AC

## 2021-11-04 MED ORDER — AMLODIPINE BESYLATE 10 MG PO TABS
10.0000 mg | ORAL_TABLET | Freq: Every day | ORAL | Status: AC
Start: 2021-11-05 — End: ?

## 2021-11-04 MED ORDER — ATORVASTATIN CALCIUM 20 MG PO TABS
20.0000 mg | ORAL_TABLET | Freq: Every day | ORAL | Status: AC
Start: 2021-11-04 — End: ?

## 2021-11-04 NOTE — Progress Notes (Signed)
Occupational Therapy Treatment Patient Details Name: Sandra Robinson MRN: [id number] DOB: 12/11/1951 Today's Date: 11/04/2021   History of present illness Pt is a 70 y/o F admitted 10/24/21 with acute encephalopathy after having AMS at home. 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 PMH includes CVA, anxiety, bipolar, depression, COPD, CKD, GERD, diverticulosis, hyperlipidemia, hypertension.   OT comments  Pt agreeable to therapy upon arrival, becomes mildly agitated at suggestion to get OOB. Pt completed seated ADL tasks with set up - mod A, requires heavy v/cing for sequencing of task. Pt oriented to self and time, becomes mildly agitated with orientation questions. Pt presents difficulty with STM recall, unable to recall information provided within <5 minutes. Pt presenting with impaired cognition and safety awareness, impairing her ability to safely perform ADLs and functional mobility. Will continue to follow acutely, recommend SNF at d/c.   Recommendations for follow up therapy are one component of a multi-disciplinary discharge planning process, led by the attending physician.  Recommendations may be updated based on patient status, additional functional criteria and insurance authorization.    Follow Up Recommendations  Skilled nursing-short term rehab (<3 hours/day)    Assistance Recommended at Discharge Frequent or constant Supervision/Assistance  Equipment Recommendations       Recommendations for Other Services PT consult    Precautions / Restrictions Precautions Precautions: Fall Precaution Comments: has sitter in room Restrictions Weight Bearing Restrictions: No       Mobility Bed Mobility Overal bed mobility: Needs Assistance Bed Mobility: Supine to Sit     Supine to sit: Supervision;HOB elevated     General bed mobility comments: pt able to pull self into long sitting in bed, initially agrees to OOB activity, then denies becomes mildly agitated    Transfers                          Balance   Sitting-balance support: Bilateral upper extremity supported;Feet supported Sitting balance-Leahy Scale: Good Sitting balance - Comments: leans forward while sitting with legs crossed, no LOB observed                                   ADL either performed or assessed with clinical judgement   ADL   Eating/Feeding: Moderate assistance;Minimal assistance;Sitting Eating/Feeding Details (indicate cue type and reason): requires mod A to sequence opening soda can, pouring, and inserting straw. Pt attempting to insert straw into closed soda can. Grooming: Set up;Sitting Grooming Details (indicate cue type and reason): combs hair long sitting in bed                               General ADL Comments: requires increased cuing for sequencing and problem solving tasks. Perseverative on combing hair.    Extremity/Trunk Assessment              Vision       Perception     Praxis      Cognition Arousal/Alertness: Awake/alert Behavior During Therapy: Flat affect Overall Cognitive Status: Impaired/Different from baseline Area of Impairment: Orientation;Attention;Memory;Safety/judgement;Awareness;Following commands                 Orientation Level: Place;Situation Current Attention Level: Focused Memory: Decreased short-term memory (unable to recall family members involved in familiar holiday routine during conversation. Pt asks repetitive questions during session, unable to remember response  to question asked <5 min ago.) Following Commands: Follows one step commands consistently;Follows one step commands with increased time Safety/Judgement: Decreased awareness of deficits;Decreased awareness of safety Awareness: Intellectual Problem Solving: Slow processing;Decreased initiation;Requires verbal cues            Exercises     Shoulder Instructions       General Comments      Pertinent Vitals/  Pain       Pain Assessment: No/denies pain  Home Living                                          Prior Functioning/Environment              Frequency  Min 2X/week        Progress Toward Goals  OT Goals(current goals can now be found in the care plan section)  Progress towards OT goals: Progressing toward goals  Acute Rehab OT Goals Patient Stated Goal: none stated OT Goal Formulation: Patient unable to participate in goal setting Time For Goal Achievement: 11/09/21 Potential to Achieve Goals: Fair ADL Goals Pt Will Perform Lower Body Bathing: with modified independence Pt Will Perform Lower Body Dressing: with modified independence Pt Will Transfer to Toilet: with modified independence;ambulating;regular height toilet Pt Will Perform Toileting - Clothing Manipulation and hygiene: with modified independence  Plan Discharge plan remains appropriate;Frequency remains appropriate    Co-evaluation                 AM-PAC OT "6 Clicks" Daily Activity     Outcome Measure   Help from another person eating meals?: A Little Help from another person taking care of personal grooming?: A Little Help from another person toileting, which includes using toliet, bedpan, or urinal?: A Lot Help from another person bathing (including washing, rinsing, drying)?: A Lot Help from another person to put on and taking off regular upper body clothing?: A Lot Help from another person to put on and taking off regular lower body clothing?: A Lot 6 Click Score: 14    End of Session    OT Visit Diagnosis: Unsteadiness on feet (R26.81);Other abnormalities of gait and mobility (R26.89);Muscle weakness (generalized) (M62.81);Other symptoms and signs involving cognitive function   Activity Tolerance Patient tolerated treatment well   Patient Left in bed;with call bell/phone within reach;with nursing/sitter in room;with bed alarm set   Nurse Communication Mobility  status        Time: 2694-8546 OT Time Calculation (min): 27 min  Charges: OT General Charges $OT Visit: 1 Visit OT Treatments $Self Care/Home Management : 23-37 mins  Lynnda Child, OTD, OTR/L Acute Rehab [phone]   Kaylyn Lim 11/04/2021, 2:09 PM

## 2021-11-04 NOTE — TOC Progression Note (Signed)
Transition of Care [facility]) - Initial/Assessment Note    Patient Details  Name: Sandra Robinson MRN: [id number] Date of Birth: 02/19/1951  [facility]) CM/SW Contact:    Milinda Antis, [place] Phone Number: 11/04/2021, 12:11 PM  Clinical Narrative:                 CSW contacted Juliann Pulse with [facility] and was informed that insurance Josem Kaufmann has not been received for the patient to admit to the facility.  Pending: insurance auth  Expected Discharge Plan: Skilled Nursing Facility Barriers to Discharge: Continued Medical Work up   Patient Goals and CMS Choice Patient states their goals for this hospitalization and ongoing recovery are:: SNF CMS Medicare.gov Compare Post Acute Care list provided to:: Patient Represenative (must comment) (Patients son [name]) Choice offered to / list presented to : Adult Children [name])  Expected Discharge Plan and Services Expected Discharge Plan: [place] In-house Referral: Clinical Social Work     Living arrangements for the past 2 months: Single Family Home Expected Discharge Date: 11/04/21                                    Prior Living Arrangements/Services Living arrangements for the past 2 months: Single Family Home Lives with:: Self Patient language and need for interpreter reviewed:: Yes Do you feel safe going back to the place where you live?: No   SNF  Need for Family Participation in Patient Care: Yes (Comment) Care giver support system in place?: Yes (comment)   Criminal Activity/Legal Involvement Pertinent to Current Situation/Hospitalization: No - Comment as needed  Activities of Daily Living Home Assistive Devices/Equipment: None ADL Screening (condition at time of admission) Patient's cognitive ability adequate to safely complete daily activities?: No Is the patient deaf or have difficulty hearing?: No Does the patient have difficulty seeing, even when wearing glasses/contacts?: No Does the patient  have difficulty concentrating, remembering, or making decisions?: Yes Patient able to express need for assistance with ADLs?: Yes Does the patient have difficulty dressing or bathing?: Yes Independently performs ADLs?: Yes (appropriate for developmental age) Does the patient have difficulty walking or climbing stairs?: Yes Weakness of Legs: Both Weakness of Arms/Hands: Both  Permission Sought/Granted Permission sought to share information with : Case Manager, Family Supports, Customer service manager Permission granted to share information with : No  Share Information with NAME: Patient currently only oriented x2 [name]  Permission granted to share info w AGENCY: Patient currently only oriented x2  SNF  Permission granted to share info w Relationship: Patient currently only oriented x2  son  Permission granted to share info w Contact Information: Patient currently only oriented x2 [name] [phone]  Emotional Assessment       Orientation: : Oriented to Self, Oriented to Situation Alcohol / Substance Use: Not Applicable Psych Involvement: No (comment)  Admission diagnosis:  Hypokalemia [E87.6] Altered mental status, unspecified altered mental status type [I45.80] Metabolic encephalopathy [D98.33] Patient Active Problem List   Diagnosis Date Noted   Aortic atherosclerosis ([place]) 10/31/2021   CVA (cerebral vascular accident) ([place]) 10/30/2021   Dementia without behavioral disturbance ([place]) 10/29/2021   FTT 10/29/2021   Left-sided weakness 10/29/2021   Implantable loop recorder present 10/29/2021   Malnutrition of moderate degree 10/27/2021   Depression 10/24/2021   COPD (chronic obstructive pulmonary disease) ([place]) 10/24/2021   Anxiety 10/11/2021   CKD (chronic kidney disease) 10/11/2021  Diverticulosis 10/11/2021   GERD (gastroesophageal reflux disease) 10/11/2021   Mixed hyperlipidemia 10/11/2021   Primary hypertension 10/11/2021   Bipolar affective disorder in  remission ([place]) 10/09/2021   Cerebral thrombosis with cerebral infarction 08/15/2021   Encephalopathy 08/12/2021   Toxic encephalopathy 08/11/2021   Hypokalemia 08/11/2021   History of stroke 08/11/2021   Incontinence of feces 07/07/2018   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 Pharmacy:   [id number] - [place], [address] [place] [address] Phone: [phone] Fax: [phone]     Social Determinants of Health (SDOH) Interventions    Readmission Risk Interventions No flowsheet data found.

## 2021-11-04 NOTE — Progress Notes (Signed)
Physical Therapy Treatment Patient Details Name: Sandra Robinson MRN: [id number] DOB: 13-Apr-1951 Today's Date: 11/04/2021   History of Present Illness Pt is pleasantly confused 70 y/o F admitted 10/24/21 with acute encephalopathy after having AMS at home. 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 PMH includes CVA, anxiety, bipolar, depression, COPD, CKD, GERD, diverticulosis, hyperlipidemia, hypertension.    PT Comments    Pt has improved well toward goals.  Emphasis on transfers and progression of gait with RW and minimal assist.  Pt able to improve heel/toe and step through pattern with the left LE, but is still mildly paretic on the left.    Recommendations for follow up therapy are one component of a multi-disciplinary discharge planning process, led by the attending physician.  Recommendations may be updated based on patient status, additional functional criteria and insurance authorization.  Follow Up Recommendations  Skilled nursing-short term rehab (<3 hours/day)     Assistance Recommended at Discharge Frequent or constant Supervision/Assistance  Equipment Recommendations  None recommended by PT    Recommendations for Other Services       Precautions / Restrictions Precautions Precautions: Fall Precaution Comments: has sitter in room Restrictions Weight Bearing Restrictions: No     Mobility  Bed Mobility Overal bed mobility: Needs Assistance Bed Mobility: Sit to Supine     Supine to sit: Supervision;HOB elevated     General bed mobility comments: pt used momentum to return to bed smoothly without need for assist    Transfers Overall transfer level: Needs assistance Equipment used: Rolling walker (2 wheels) Transfers: Sit to/from Stand Sit to Stand: Min guard           General transfer comment: cues for hand placement/safety    Ambulation/Gait Ambulation/Gait assistance: Min assist Gait Distance (Feet): 85 Feet Assistive device: Rolling walker (2  wheels) Gait Pattern/deviations: Step-to pattern;Step-through pattern Gait velocity: decreased Gait velocity interpretation: <1.31 ft/sec, indicative of household ambulator   General Gait Details: improved gait, but still mildly paretic.  Pt initially stepping through, working on improved heel/toe pattern.  As she began to fatigue,  pattern changed to step to and more foot flat including slumped posture and further proximity behind the RW.   Stairs             Wheelchair Mobility    Modified Rankin (Stroke Patients Only) Modified Rankin (Stroke Patients Only) Modified Rankin: Moderately severe disability     Balance Overall balance assessment: Needs assistance Sitting-balance support: Bilateral upper extremity supported;Feet supported Sitting balance-Leahy Scale: Good Sitting balance - Comments: leans forward while sitting with legs crossed, no LOB observed     Standing balance-Leahy Scale: Poor Standing balance comment: reliant on uE                            Cognition Arousal/Alertness: Awake/alert Behavior During Therapy: Flat affect Overall Cognitive Status: Impaired/Different from baseline Area of Impairment: Orientation;Attention;Memory;Safety/judgement;Awareness;Following commands                 Orientation Level: Place;Situation Current Attention Level: Sustained Memory: Decreased short-term memory (unable to recall family members involved in familiar holiday routine during conversation. Pt asks repetitive questions during session, unable to remember response to question asked <5 min ago.) Following Commands: Follows one step commands consistently Safety/Judgement: Decreased awareness of deficits;Decreased awareness of safety Awareness: Emergent;Intellectual Problem Solving: Slow processing;Decreased initiation;Requires verbal cues          Exercises  General Comments        Pertinent Vitals/Pain Pain Assessment: No/denies  pain    Home Living                          Prior Function            PT Goals (current goals can now be found in the care plan section) Acute Rehab PT Goals PT Goal Formulation: Patient unable to participate in goal setting Time For Goal Achievement: 11/09/21 Potential to Achieve Goals: Fair Progress towards PT goals: Progressing toward goals    Frequency    Min 3X/week      PT Plan Current plan remains appropriate    Co-evaluation              AM-PAC PT "6 Clicks" Mobility   Outcome Measure  Help needed turning from your back to your side while in a flat bed without using bedrails?: A Little Help needed moving from lying on your back to sitting on the side of a flat bed without using bedrails?: A Little Help needed moving to and from a bed to a chair (including a wheelchair)?: A Little Help needed standing up from a chair using your arms (e.g., wheelchair or bedside chair)?: A Little Help needed to walk in hospital room?: A Little Help needed climbing 3-5 steps with a railing? : A Lot 6 Click Score: 17    End of Session   Activity Tolerance: Patient tolerated treatment well;Patient limited by fatigue Patient left: in bed;with call bell/phone within reach;with bed alarm set;with nursing/sitter in room Nurse Communication: Mobility status PT Visit Diagnosis: Unsteadiness on feet (R26.81);Other abnormalities of gait and mobility (R26.89);Muscle weakness (generalized) (M62.81);Other symptoms and signs involving the nervous system (E33.295)     Time: 1884-1660 PT Time Calculation (min) (ACUTE ONLY): 18 min  Charges:  $Gait Training: 8-22 mins                     11/04/2021  Ginger Carne., PT Acute Rehabilitation Services [phone]  (pager) [phone]  (office)   Tessie Fass Taquila Leys 11/04/2021, 3:24 PM

## 2021-11-04 NOTE — Discharge Summary (Signed)
Physician Discharge Summary  Sandra Robinson JSE:[id number] DOB: 03-06-1951 DOA: 10/24/2021  PCP: No primary care provider on file.  Admit date: 10/24/2021 Discharge date: 11/04/2021  Admitted From: Home Discharge disposition: SNF   Code Status: Full Code   Discharge Diagnosis:   Principal Problem:   CVA (cerebral vascular accident) ([place]) Active Problems:   Toxic encephalopathy   Anxiety   Bipolar affective disorder in remission ([place])   Depression   COPD (chronic obstructive pulmonary disease) ([place])   History of stroke   Mixed hyperlipidemia   Malnutrition of moderate degree   Dementia without behavioral disturbance ([place])   FTT   Left-sided weakness   Implantable loop recorder present   Aortic atherosclerosis ([place])   Chief complaint: Altered mental status  Brief narrative: Sandra Robinson is a 70 y.o. female with PMH significant for HTN, HLD, prediabetes, stroke in August 2022, CKD, COPD, bipolar disorder, depression, chronic low back pain. Patient was brought to the ED on 11/7 with altered mental status.  Per family, for several months, patient has been physically and mentally declining.  She has not taking her mental health medications, wandering in the neighborhood, not eating well, losing weight and unable to take care of herself. She was admitted to hospital service Met adjusted. Psych meds adjusted.  PT eval was obtained.  SNF recommended. While waiting for placement, on 11/13, she had an MRI of head done which showed multiple acute infarcts.  Stroke work-up was subsequently done. Underwent TEE 11/15.  No evidence of PFO or left atrial appendage thrombus. Pending placement at this time.  Subjective: Patient was seen and examined this morning. Sitting up in chair.  Not in distress.  No new symptoms.  Not on supplemental oxygen. Telemetry showed 13 beats of V. tach yesterday.  Hospital course: Acute CVA -MRI on 11/13 showed multiple acute infarcts.  Stroke work-up  was completed. -Loop recorder interpretation did not show any evidence of A. fib. -11/15, patient underwent TEE which did not show any PFO or left atrial appendage thrombus. -EEG unremarkable -Prior to admission, patient was on aspirin 81 mg daily.  Per neurology recommendation, patient is currently on aspirin 81 mg daily and Plavix 75 mg daily for 3 weeks followed by Plavix alone (starting 12/5). -LDL low.  Statin dose reduced. -SNF recommended by PT eval. -Neurology follow-up appreciated.  Hypercoagulable work-up sent.  Toxic encephalopathy Wandering behavior -Delirium secondary to stroke and combination of worsening dementia.   -Mental status improved now.  Not restless or agitated for last 3 days.  Bipolar affective disorder Depression/anxiety -Per grandson this is longstanding and patient has been on medication for many years, apparently not taking this medication appropriately in the outpatient setting or at least questionable.  Seen by psychiatry, not felt to have signs or symptoms of decompensated bipolar disorder.  Recommendation was to continue gabapentin with up titration as indicated. Continue Buspar, trazodone, Zoloft.   Nonsustained V. Tach -Potassium and magnesium level checked this morning.  Will replace potassium to keep it more than 4. Recent Labs  Lab 10/30/21 1205 11/04/21 0939  K 4.7 3.7  MG  --  1.9   Essential hypertension -Resume HCTZ post discharge  COPD -Appears stable, continue bronchodilators   Failure to thrive Moderate malnutrition -With significant weight loss per family. Encourage oral appetite. -Continue Multivitamin w/ minerals daily, Ensure Enlive po BID    Mixed hyperlipidemia -Continue statin, but at lower dose given low LDL    Allergies as of 11/04/2021  Reactions   Latex Other (See Comments)   Unknown reaction - reported by [facility]   Morphine Nausea And Vomiting   Reported by [facility]   Morphine And Related Nausea And  Vomiting   Other Other (See Comments)   "tear skin off" & blisters   Oxycodone Other (See Comments)   Unknown reaction - reported by [facility]   Tape Other (See Comments)   "tear skin off"    Tape Other (See Comments)   Tears skin        Medication List     STOP taking these medications    topiramate 100 MG tablet Commonly known as: TOPAMAX   valACYclovir 1000 MG tablet Commonly known as: VALTREX       TAKE these medications    albuterol 108 (90 Base) MCG/ACT inhaler Commonly known as: VENTOLIN HFA Inhale 2 puffs into the lungs every 6 (six) hours as needed for wheezing or shortness of breath.   amLODipine 10 MG tablet Commonly known as: NORVASC Take 1 tablet (10 mg total) by mouth daily. Start taking on: November 05, 2021   aspirin 81 MG EC tablet Take 1 tablet (81 mg total) by mouth daily for 16 days. Swallow whole. Aspirin and Plavix till 12/5 followed by Plavix alone for long-term Start taking on: November 05, 2021 What changed: additional instructions   atorvastatin 20 MG tablet Commonly known as: LIPITOR Take 1 tablet (20 mg total) by mouth at bedtime. What changed:  medication strength how much to take   budesonide-formoterol 160-4.5 MCG/ACT inhaler Commonly known as: SYMBICORT Inhale 2 puffs into the lungs 2 (two) times daily.   busPIRone 15 MG tablet Commonly known as: BUSPAR Take 15 mg by mouth 2 (two) times daily.   clopidogrel 75 MG tablet Commonly known as: PLAVIX Take 1 tablet (75 mg total) by mouth daily. Start taking on: November 05, 2021   clotrimazole 1 % cream Commonly known as: LOTRIMIN Apply 1 application topically daily as needed for rash.   cyanocobalamin 1000 MCG tablet Take 1 tablet (1,000 mcg total) by mouth daily.   feeding supplement Liqd Take 237 mLs by mouth 2 (two) times daily between meals.   gabapentin 300 MG capsule Commonly known as: NEURONTIN Take 1 capsule (300 mg total) by mouth 2 (two) times daily. What  changed:  medication strength how much to take when to take this   hydrochlorothiazide 12.5 MG capsule Commonly known as: MICROZIDE Take 12.5 mg by mouth daily.   Melatonin 10 MG Tabs Take 10 mg by mouth at bedtime as needed. What changed:  medication strength how much to take when to take this reasons to take this   multivitamins with iron Tabs tablet Take 1 tablet by mouth daily. Start taking on: November 05, 2021   omeprazole 20 MG capsule Commonly known as: PRILOSEC Take 20 mg by mouth daily.   Potassium 99 MG Tabs Take 99 mg by mouth daily.   PROBIOTIC PO Take 1 capsule by mouth daily.   sertraline 100 MG tablet Commonly known as: ZOLOFT Take 100 mg by mouth every morning.   traZODone 50 MG tablet Commonly known as: DESYREL Take 1 tablet (50 mg total) by mouth at bedtime. What changed:  medication strength how much to take        Discharge Instructions:  Diet Recommendation:  Discharge Diet Orders (From admission, onward)     Start     Ordered   11/04/21 0000  Diet general  Comments: Diet Orders (From admission, onward)    Start     Ordered   11/02/21 1451  Diet regular Room service appropriate? Yes; Fluid  consistency: Thin  Diet effective now       Question Answer Comment  Room service appropriate? Yes   Fluid consistency: Thin     11/02/21 1451       11/04/21 1148              @BRDDSCINSTRUCTIONS @  Follow ups:    Follow-up Information     Garvin Fila, MD. Schedule an appointment as soon as possible for a visit in 1 month(s).   Specialties: Neurology, Radiology Why: stroke clinic Contact information: [address] [place] [address] [phone]                 Wound care:     Discharge Exam:   Vitals:   11/03/21 2155 11/04/21 0450 11/04/21 0802 11/04/21 0802  BP:  139/75  (!) 145/76  Pulse:  77  76  Resp:  16  16  Temp:  98.5 F (36.9 C)  (!) 97.5 F (36.4 C)  TempSrc:  Oral   Oral  SpO2: 95% 93% 94% 94%  Weight:        Body mass index is 27.53 kg/m.  General exam: Pleasant, elderly female.  Not in physical distress Skin: No rashes, lesions or ulcers. HEENT: Atraumatic, normocephalic, no obvious bleeding Lungs: Clear to auscultation bilaterally CVS: Regular rate and rhythm, no murmur GI/Abd soft, nontender, nondistended, bowel sound present CNS: Alert, awake, oriented to place only. Psychiatry: Mood appropriate Extremities: No pedal edema, no calf redness  Time coordinating discharge: 35 minutes   The results of significant diagnostics from this hospitalization (including imaging, microbiology, ancillary and laboratory) are listed below for reference.    Procedures and Diagnostic Studies:   CT HEAD WO CONTRAST  Result Date: 10/24/2021 CLINICAL DATA:  Mental status change EXAM: CT HEAD WITHOUT CONTRAST TECHNIQUE: Contiguous axial images were obtained from the base of the skull through the vertex without intravenous contrast. COMPARISON:  MRI 08/15/2021, CT 08/11/2021 FINDINGS: Brain: No acute territorial infarction, hemorrhage or new intracranial mass. Left parietal convexity meningioma on MRI is better seen on MRI. Multiple chronic infarcts involving the right greater than left cerebellum, left basal ganglia, left white matter, and left posteromedial temporal lobe and medial left occipital lobe. Atrophy and chronic small vessel ischemic changes of the white matter. Stable ventricle size. Vascular: No hyperdense vessels.  No unexpected calcification Skull: Normal. Negative for fracture or focal lesion. Sinuses/Orbits: No acute finding. Other: None IMPRESSION: 1. No definite CT evidence for acute intracranial abnormality. 2. Atrophy and chronic small vessel ischemic changes of the white matter. Multiple chronic infarcts as described above. Electronically Signed   By: Donavan Foil M.D.   On: 10/24/2021 16:39   DG Chest Port 1 View  Result Date:  10/24/2021 CLINICAL DATA:  Altered mental status EXAM: PORTABLE CHEST 1 VIEW COMPARISON:  08/11/2021 FINDINGS: Recording device over left chest. Cardiomegaly. No focal opacity, pleural effusion or pneumothorax. Partially visualized right shoulder replacement IMPRESSION: No active disease.  Cardiomegaly Electronically Signed   By: Donavan Foil M.D.   On: 10/24/2021 19:42     Labs:   Basic Metabolic Panel: Recent Labs  Lab 10/30/21 1205 11/04/21 0939  NA 138 139  K 4.7 3.7  CL 105 110  CO2 21* 24  GLUCOSE 158* 135*  BUN 22 23  CREATININE 1.04*  1.06*  CALCIUM 9.5 9.3  MG  --  1.9   GFR Estimated Creatinine Clearance: 47.1 mL/min (A) (by C-G formula based on SCr of 1.06 mg/dL (H)). Liver Function Tests: Recent Labs  Lab 10/30/21 1205  AST 77*  ALT 59*  ALKPHOS 106  BILITOT 0.6  PROT 6.9  ALBUMIN 3.5   No results for input(s): LIPASE, AMYLASE in the last 168 hours. No results for input(s): AMMONIA in the last 168 hours. Coagulation profile No results for input(s): INR, PROTIME in the last 168 hours.  CBC: Recent Labs  Lab 10/30/21 1142 11/04/21 0939  WBC 9.0 9.9  NEUTROABS 5.6 6.1  HGB 13.7 12.8  HCT 42.0 38.4  MCV 89.7 89.3  PLT 298 268   Cardiac Enzymes: No results for input(s): CKTOTAL, CKMB, CKMBINDEX, TROPONINI in the last 168 hours. BNP: Invalid input(s): POCBNP CBG: Recent Labs  Lab 11/02/21 1627  GLUCAP 212*   D-Dimer No results for input(s): DDIMER in the last 72 hours. Hgb A1c No results for input(s): HGBA1C in the last 72 hours. Lipid Profile No results for input(s): CHOL, HDL, LDLCALC, TRIG, CHOLHDL, LDLDIRECT in the last 72 hours. Thyroid function studies No results for input(s): TSH, T4TOTAL, T3FREE, THYROIDAB in the last 72 hours.  Invalid input(s): FREET3 Anemia work up No results for input(s): VITAMINB12, FOLATE, FERRITIN, TIBC, IRON, RETICCTPCT in the last 72 hours. Microbiology Recent Results (from the past 240 hour(s))  Resp  Panel by RT-PCR (Flu A&B, Covid) Nasopharyngeal Swab     Status: None   Collection Time: 11/02/21  4:14 PM   Specimen: Nasopharyngeal Swab; Nasopharyngeal(NP) swabs in vial transport medium  Result Value Ref Range Status   SARS Coronavirus 2 by RT PCR NEGATIVE NEGATIVE Final    Comment: (NOTE) SARS-CoV-2 target nucleic acids are NOT DETECTED.  The SARS-CoV-2 RNA is generally detectable in upper respiratory specimens during the acute phase of infection. The lowest concentration of SARS-CoV-2 viral copies this assay can detect is 138 copies/mL. A negative result does not preclude SARS-Cov-2 infection and should not be used as the sole basis for treatment or other patient management decisions. A negative result may occur with  improper specimen collection/handling, submission of specimen other than nasopharyngeal swab, presence of viral mutation(s) within the areas targeted by this assay, and inadequate number of viral copies(<138 copies/mL). A negative result must be combined with clinical observations, patient history, and epidemiological information. The expected result is Negative.  Fact Sheet for Patients:  EntrepreneurPulse.com.au  Fact Sheet for Healthcare Providers:  IncredibleEmployment.be  This test is no t yet approved or cleared by the [place] FDA and  has been authorized for detection and/or diagnosis of SARS-CoV-2 by FDA under an Emergency Use Authorization (EUA). This EUA will remain  in effect (meaning this test can be used) for the duration of the COVID-19 declaration under Section 564(b)(1) of the Act, 21 U.S.C.section 360bbb-3(b)(1), unless the authorization is terminated  or revoked sooner.       Influenza A by PCR NEGATIVE NEGATIVE Final   Influenza B by PCR NEGATIVE NEGATIVE Final    Comment: (NOTE) The Xpert Xpress SARS-CoV-2/FLU/RSV plus assay is intended as an aid in the diagnosis of influenza from Nasopharyngeal  swab specimens and should not be used as a sole basis for treatment. Nasal washings and aspirates are unacceptable for Xpert Xpress SARS-CoV-2/FLU/RSV testing.  Fact Sheet for Patients: EntrepreneurPulse.com.au  Fact Sheet for Healthcare Providers: IncredibleEmployment.be  This test is not yet approved or cleared by  the [employer] and has been authorized for detection and/or diagnosis of SARS-CoV-2 by FDA under an Emergency Use Authorization (EUA). This EUA will remain in effect (meaning this test can be used) for the duration of the COVID-19 declaration under Section 564(b)(1) of the Act, 21 U.S.C. section 360bbb-3(b)(1), unless the authorization is terminated or revoked.  Performed at [facility], [place] [address] [place], [place]  [address]      Signed: Terrilee Croak  [facility] 11/04/2021, 11:48 AM

## 2021-11-04 NOTE — Plan of Care (Signed)

## 2021-11-05 LAB — HOMOCYSTEINE: Homocysteine: 20.5 umol/L — ABNORMAL HIGH (ref 0.0–17.2)

## 2021-11-05 NOTE — Progress Notes (Signed)
SVT reported by CCMD at 0330.  Patient asleep and easy to arouse when going to place lead back on. NAD assessed.

## 2021-11-05 NOTE — TOC Progression Note (Addendum)
Transition of Care [facility]) - Progression Note    Patient Details  Name: Sandra Robinson MRN: [id number] Date of Birth: 17-Jul-1951  Transition of Care [facility] CM/SW [place], [place] Phone Number: 11/05/2021, 11:25 AM  Clinical Narrative:    CSW spoke with [facility] concerning pt discharging to them today.  Juliann Pulse from [facility] stated " pt will get the 1st female bed that opens up". [place] has received insurance auth. [place] will keep CSW updated on disposition planning.   Expected Discharge Plan: [place] Barriers to Discharge: Continued Medical Work up  Expected Discharge Plan and Services Expected Discharge Plan: [place] In-house Referral: Clinical Social Work     Living arrangements for the past 2 months: Single Family Home Expected Discharge Date: 11/04/21                                     Social Determinants of Health (SDOH) Interventions    Readmission Risk Interventions No flowsheet data found.

## 2021-11-05 NOTE — Progress Notes (Signed)
PROGRESS NOTE  Sandra Robinson  DOB: May 19, 1951  PCP: No primary care provider on file. EUM:[id number]  DOA: 10/24/2021  LOS: 11 days  Hospital Day: 105  Chief complaint: Altered mental status  Brief narrative: Sandra Robinson is a 70 y.o. female with PMH significant for HTN, HLD, prediabetes, stroke in August 2022, CKD, COPD, bipolar disorder, depression, chronic low back pain. Patient was brought to the ED on 11/7 with altered mental status.  Per family, for several months, patient has been physically and mentally declining.  She has not taking her mental health medications, wandering in the neighborhood, not eating well, losing weight and unable to take care of herself. She was admitted to hospital service Met adjusted. Psych meds adjusted.  PT eval was obtained.  SNF recommended. While waiting for placement, on 11/13, she had an MRI of head done which showed multiple acute infarcts.  Stroke work-up was subsequently done. Underwent TEE 11/15.  No evidence of PFO or left atrial appendage thrombus. Pending placement at this time.  Subjective: Patient was seen and examined this morning.  Lying on bed.  Not in distress.  Telemetry sitter present but patient has been alert, awake and not restless per last 2 to 3 days.  Assessment/Plan: Acute CVA -MRI on 11/13 showed multiple acute infarcts.  Stroke work-up was completed. -Loop recorder interpretation did not show any evidence of A. fib. -11/15, patient underwent TEE which did not show any PFO or left atrial appendage thrombus. -EEG unremarkable -Prior to admission, patient was on aspirin 81 mg daily.  Per neurology recommendation, patient is currently on aspirin 81 mg daily and Plavix 75 mg daily for 3 weeks followed by Plavix alone (starting 12/5) -LDL low.  Statin dose reduced. -SNF recommended by PT eval. -Neurology follow-up appreciated.  Hypercoagulable work-up sent.  Toxic encephalopathy Wandering behavior -Delirium secondary to  stroke and combination of worsening dementia.   -Mental status much better last 2 to 3 days.  Bipolar affective disorder Depression/anxiety -Per grandson this is longstanding and patient has been on medication for many years, apparently not taking this medication appropriately in the outpatient setting or at least questionable.  Seen by psychiatry, not felt to have signs or symptoms of decompensated bipolar disorder.  Recommendation was to continue gabapentin with up titration as indicated.  Continue Buspar, trazodone, Zoloft.  Nonsustained V. Tach -Potassium and magnesium level checked and replaced as needed. Recent Labs  Lab 10/30/21 1205 11/04/21 0939  K 4.7 3.7  MG  --  1.9   Essential hypertension -Blood pressure mostly stable except for episodic rise to 150s -HCTZ on hold.  Can resume post discharge.   COPD -Appears stable, continue bronchodilators   Failure to thrive Moderate malnutrition -With significant weight loss per family. Encourage oral appetite. -Continue Multivitamin w/ minerals daily, Ensure Enlive po BID    Mixed hyperlipidemia -Continue statin, but at lower dose given low LDL   Mobility: PT eval obtained Living condition: Was living at home Goals of care:   Code Status: Full Code  Nutritional status: Body mass index is 27.53 kg/m. Nutrition Problem: Moderate Malnutrition Etiology: chronic illness (COPD) Signs/Symptoms: moderate muscle depletion, mild fat depletion, percent weight loss Percent weight loss: 12 % Diet:  Diet Order             Diet general           Diet regular Room service appropriate? Yes; Fluid consistency: Thin  Diet effective now  DVT prophylaxis:  enoxaparin (LOVENOX) injection 40 mg Start: 10/25/21 1800   Antimicrobials: None Fluid: None Consultants: Neurology, cardiology Family Communication: None at bedside  Status is: Inpatient  Remains inpatient appropriate because: Pending SNF  Dispo: The  patient is from: Home              Anticipated d/c is to: SNF              Patient currently is medically stable to d/c.   Difficult to place patient No     Infusions:    Scheduled Meds:  amLODipine  10 mg Oral Daily   aspirin EC  81 mg Oral Daily   atorvastatin  20 mg Oral QHS   busPIRone  15 mg Oral BID   clopidogrel  75 mg Oral Daily   enoxaparin (LOVENOX) injection  40 mg Subcutaneous Q24H   feeding supplement  237 mL Oral BID BM   gabapentin  300 mg Oral BID   mometasone-formoterol  2 puff Inhalation BID   multivitamins with iron  1 tablet Oral Daily   pantoprazole  40 mg Oral Daily   sertraline  100 mg Oral q morning   sodium chloride flush  3 mL Intravenous Once   traZODone  50 mg Oral QHS   vitamin B-12  1,000 mcg Oral Daily    PRN meds: acetaminophen **OR** acetaminophen, albuterol, melatonin   Antimicrobials: Anti-infectives (From admission, onward)    Start     Dose/Rate Route Frequency Ordered Stop   10/25/21 1645  valACYclovir (VALTREX) tablet 1,000 mg  Status:  Discontinued       Note to Pharmacy: For 8 days     1,000 mg Oral Daily 10/25/21 1558 10/25/21 1729   10/24/21 2200  cefTRIAXone (ROCEPHIN) 1 g in sodium chloride 0.9 % 100 mL IVPB  Status:  Discontinued        1 g 200 mL/hr over 30 Minutes Intravenous Every 24 hours 10/24/21 2059 10/25/21 1559       Objective: Vitals:   11/05/21 0412 11/05/21 0812  BP: (!) 152/96 123/70  Pulse: 87 84  Resp: 16 16  Temp: 97.6 F (36.4 C) 98.1 F (36.7 C)  SpO2: 98% 97%    Intake/Output Summary (Last 24 hours) at 11/05/2021 1117 Last data filed at 11/04/2021 1816 Gross per 24 hour  Intake 700 ml  Output --  Net 700 ml    Filed Weights   10/26/21 1347  Weight: 70.5 kg   Weight change:  Body mass index is 27.53 kg/m.   Physical Exam: General exam: Pleasant, elderly female.  Not in physical distress Skin: No rashes, lesions or ulcers. HEENT: Atraumatic, normocephalic, no obvious  bleeding Lungs: Clear to auscultation bilaterally CVS: Regular rate and rhythm, no murmur GI/Abd soft, nontender, nondistended, bowel sound present CNS: Alert, awake, oriented to place only. Psychiatry: Mood appropriate Extremities: No pedal edema, no calf redness  Data Review: I have personally reviewed the laboratory data and studies available.  F/u labs ordered Unresulted Labs (From admission, onward)     Start     Ordered   11/02/21 1726  MTHFR DNA Analysis  Once,   R       Question:  Specimen collection method  Answer:  Lab=Lab collect   11/02/21 1725   11/02/21 1724  Beta-2-glycoprotein i abs, IgG/M/A  (Hypercoagulable Panel, Comprehensive (PNL))  Once,   R       Question:  Specimen collection method  Answer:  Lab=Lab collect  11/02/21 1725   11/02/21 1724  Homocysteine, serum  (Hypercoagulable Panel, Comprehensive (PNL))  Once,   R       Question:  Specimen collection method  Answer:  Lab=Lab collect   11/02/21 1725   11/02/21 1724  Prothrombin gene mutation  (Hypercoagulable Panel, Comprehensive (PNL))  Once,   R       Question:  Specimen collection method  Answer:  Lab=Lab collect   11/02/21 1725   11/02/21 1724  Cardiolipin antibodies, IgG, IgM, IgA  (Hypercoagulable Panel, Comprehensive (PNL))  Once,   R       Question:  Specimen collection method  Answer:  Lab=Lab collect   11/02/21 1725   11/02/21 1724  Phosphatidylserine antibodies  Once,   R       Question:  Specimen collection method  Answer:  Lab=Lab collect   11/02/21 1725   11/02/21 1724  ANA, IFA (with reflex)  Once,   R       Question:  Specimen collection method  Answer:  Lab=Lab collect   11/02/21 1725            Signed, Terrilee Croak, MD [facility] 11/05/2021

## 2021-11-06 MED ORDER — SODIUM CHLORIDE 0.9 % IV SOLN: INTRAVENOUS | Status: DC

## 2021-11-06 NOTE — TOC Progression Note (Addendum)
Transition of Care [facility]) - Progression Note    Patient Details  Name: COLANDRA OHANIAN MRN: [id number] Date of Birth: January 02, 1951  Transition of Care [facility]) CM/SW [place], [place] Phone Number: 11/06/2021, 9:44 AM  Clinical Narrative:      Update- CSW spoke with Juliann Pulse who confirmed they had a bed open up and can accept patient for dc today. CSW informed MD. MD confirmed patient not medically ready today for dc. CSW informed Juliann Pulse with [facility]. CSW will continue to follow and assist with patient dc planning needs.   Update- CSW spoke with Juliann Pulse with [facility] regarding patients dc. Juliann Pulse confirmed they have no SNF bed available today for patient. Juliann Pulse confirmed pt will get the 1st female bed that opens up. CSW informed MD. CSW will continue to follow.  CSW left voicemail with Juliann Pulse and Claiborne Billings with [facility] to see if patient can dc over today. CSW awaiting callback. CSW will continue to follow and assist with patients dc planning needs.  Expected Discharge Plan: [place] Barriers to Discharge: Continued Medical Work up  Expected Discharge Plan and Services Expected Discharge Plan: [place] In-house Referral: Clinical Social Work     Living arrangements for the past 2 months: Single Family Home Expected Discharge Date: 11/04/21                                     Social Determinants of Health (SDOH) Interventions    Readmission Risk Interventions No flowsheet data found.

## 2021-11-06 NOTE — Progress Notes (Signed)
PROGRESS NOTE  Sandra Robinson  DOB: [birth year]  PCP: No primary care provider on file. SWF:[id number]  DOA: 10/24/2021  LOS: 12 days  Hospital Day: 14  Chief complaint: Altered mental status  Brief narrative: Sandra Robinson is a 70 y.o. female with PMH significant for HTN, HLD, prediabetes, stroke in August 2022, CKD, COPD, bipolar disorder, depression, chronic low back pain. Patient was brought to the ED on 11/7 with altered mental status.  Per family, for several months, patient has been physically and mentally declining.  She has not taking her mental health medications, wandering in the neighborhood, not eating well, losing weight and unable to take care of herself. She was admitted to hospital service Met adjusted. Psych meds adjusted.  PT eval was obtained.  SNF recommended. While waiting for placement, on 11/13, she had an MRI of head done which showed multiple acute infarcts.  Stroke work-up was subsequently done. Underwent TEE 11/15.  No evidence of PFO or left atrial appendage thrombus. Pending placement at this time.  Subjective: Patient was seen and examined this morning. Lying on bed.  Not in distress.   Per nursing staff, patient is having multiple loose bowel movements this morning. No fever.  Assessment/Plan: Acute CVA -MRI on 11/13 showed multiple acute infarcts.  Stroke work-up was completed. -Loop recorder interpretation did not show any evidence of A. fib. -11/15, patient underwent TEE which did not show any PFO or left atrial appendage thrombus. -EEG unremarkable -Prior to admission, patient was on aspirin 81 mg daily.  Per neurology recommendation, patient is currently on aspirin 81 mg daily and Plavix 75 mg daily for 3 weeks followed by Plavix alone (starting 12/5) -LDL low.  Statin dose reduced. -SNF recommended by PT eval. -Neurology follow-up appreciated.  Hypercoagulable work-up sent.  #Frequent diarrhea -Reported today.  She is not on any scheduled  stool softeners or laxatives.  She was started on Ensure.  We will keep it on hold for now.  Monitor for further episodes of diarrhea.  I will start on gentle hydration with NS at 75 mill per hour -Obtain electrolytes tomorrow.  Toxic encephalopathy Dementia with wandering behavior -Delirium secondary to stroke and combination of worsening dementia.   -Mental status much better last 2 to 3 days.  Bipolar affective disorder Depression/anxiety -Per grandson this is longstanding and patient has been on medication for many years, apparently not taking this medication appropriately in the outpatient setting or at least questionable.  Seen by psychiatry, not felt to have signs or symptoms of decompensated bipolar disorder.  Recommendation was to continue gabapentin with up titration as indicated.  Continue Buspar, trazodone, Zoloft.  Nonsustained V. Tach -Potassium and magnesium level checked and replaced as needed. Recent Labs  Lab 10/30/21 1205 11/04/21 0939  K 4.7 3.7  MG  --  1.9    Essential hypertension -Blood pressure mostly stable except for episodic rise to 150s -HCTZ on hold.  Can resume post discharge.   COPD -Appears stable, continue bronchodilators   Failure to thrive Moderate malnutrition -With significant weight loss per family. Encourage oral appetite. -Continue Multivitamin w/ minerals daily, Ensure Enlive po BID    Mixed hyperlipidemia -Continue statin, but at lower dose given low LDL   Mobility: PT eval obtained Living condition: Was living at home Goals of care:   Code Status: Full Code  Nutritional status: Body mass index is 27.53 kg/m. Nutrition Problem: Moderate Malnutrition Etiology: chronic illness (COPD) Signs/Symptoms: moderate muscle depletion, mild fat depletion, percent  weight loss Percent weight loss: 12 % Diet:  Diet Order             Diet general           Diet regular Room service appropriate? Yes; Fluid consistency: Thin  Diet  effective now                  DVT prophylaxis:  enoxaparin (LOVENOX) injection 40 mg Start: 10/25/21 1800   Antimicrobials: None Fluid: None Consultants: Neurology, cardiology Family Communication: None at bedside  Status is: Inpatient  Remains inpatient appropriate because: Pending SNF  Dispo: The patient is from: Home              Anticipated d/c is to: SNF              Patient currently is not medically stable to d/c.   Difficult to place patient No     Infusions:    Scheduled Meds:  amLODipine  10 mg Oral Daily   aspirin EC  81 mg Oral Daily   atorvastatin  20 mg Oral QHS   busPIRone  15 mg Oral BID   clopidogrel  75 mg Oral Daily   enoxaparin (LOVENOX) injection  40 mg Subcutaneous Q24H   feeding supplement  237 mL Oral BID BM   gabapentin  300 mg Oral BID   mometasone-formoterol  2 puff Inhalation BID   multivitamins with iron  1 tablet Oral Daily   pantoprazole  40 mg Oral Daily   sertraline  100 mg Oral q morning   sodium chloride flush  3 mL Intravenous Once   traZODone  50 mg Oral QHS   vitamin B-12  1,000 mcg Oral Daily    PRN meds: acetaminophen **OR** acetaminophen, albuterol, melatonin   Antimicrobials: Anti-infectives (From admission, onward)    Start     Dose/Rate Route Frequency Ordered Stop   10/25/21 1645  valACYclovir (VALTREX) tablet 1,000 mg  Status:  Discontinued       Note to Pharmacy: For 8 days     1,000 mg Oral Daily 10/25/21 1558 10/25/21 1729   10/24/21 2200  cefTRIAXone (ROCEPHIN) 1 g in sodium chloride 0.9 % 100 mL IVPB  Status:  Discontinued        1 g 200 mL/hr over 30 Minutes Intravenous Every 24 hours 10/24/21 2059 10/25/21 1559       Objective: Vitals:   11/06/21 0413 11/06/21 0903  BP: (!) 153/84 122/72  Pulse: 92 (!) 102  Resp: 16 17  Temp: 98.4 F (36.9 C) 97.6 F (36.4 C)  SpO2: 96% 95%    Intake/Output Summary (Last 24 hours) at 11/06/2021 1122 Last data filed at 11/06/2021 0818 Gross per 24 hour   Intake 1760 ml  Output --  Net 1760 ml    Filed Weights   10/26/21 1347  Weight: 70.5 kg   Weight change:  Body mass index is 27.53 kg/m.   Physical Exam: General exam: Pleasant, elderly female.  Not in physical distress Skin: No rashes, lesions or ulcers. HEENT: Atraumatic, normocephalic, no obvious bleeding Lungs: Clear to auscultation bilaterally CVS: Regular rate and rhythm, no murmur GI/Abd soft, nontender, nondistended, bowel sound present CNS: Alert, awake, oriented to place only.  Dementia at baseline Psychiatry: Mood appropriate Extremities: No pedal edema, no calf redness  Data Review: I have personally reviewed the laboratory data and studies available.  F/u labs ordered [employer] (From admission, onward)     Start  Ordered   11/02/21 1726  MTHFR DNA Analysis  Once,   R       Question:  Specimen collection method  Answer:  Lab=Lab collect   11/02/21 1725   11/02/21 1724  Beta-2-glycoprotein i abs, IgG/M/A  (Hypercoagulable Panel, Comprehensive (PNL))  Once,   R       Question:  Specimen collection method  Answer:  Lab=Lab collect   11/02/21 1725   11/02/21 1724  Prothrombin gene mutation  (Hypercoagulable Panel, Comprehensive (PNL))  Once,   R       Question:  Specimen collection method  Answer:  Lab=Lab collect   11/02/21 1725   11/02/21 1724  Cardiolipin antibodies, IgG, IgM, IgA  (Hypercoagulable Panel, Comprehensive (PNL))  Once,   R       Question:  Specimen collection method  Answer:  Lab=Lab collect   11/02/21 1725   11/02/21 1724  Phosphatidylserine antibodies  Once,   R       Question:  Specimen collection method  Answer:  Lab=Lab collect   11/02/21 1725   11/02/21 1724  ANA, IFA (with reflex)  Once,   R       Question:  Specimen collection method  Answer:  Lab=Lab collect   11/02/21 1725            Signed, Terrilee Croak, MD [facility] 11/06/2021

## 2021-11-07 DIAGNOSIS — M6281 Muscle weakness (generalized): Secondary | ICD-10-CM | POA: Diagnosis not present

## 2021-11-07 DIAGNOSIS — Z743 Need for continuous supervision: Secondary | ICD-10-CM | POA: Diagnosis not present

## 2021-11-07 DIAGNOSIS — F332 Major depressive disorder, recurrent severe without psychotic features: Secondary | ICD-10-CM | POA: Diagnosis not present

## 2021-11-07 DIAGNOSIS — F411 Generalized anxiety disorder: Secondary | ICD-10-CM | POA: Diagnosis not present

## 2021-11-07 DIAGNOSIS — G459 Transient cerebral ischemic attack, unspecified: Secondary | ICD-10-CM | POA: Diagnosis not present

## 2021-11-07 DIAGNOSIS — M79661 Pain in right lower leg: Secondary | ICD-10-CM | POA: Diagnosis not present

## 2021-11-07 DIAGNOSIS — J449 Chronic obstructive pulmonary disease, unspecified: Secondary | ICD-10-CM | POA: Diagnosis not present

## 2021-11-07 DIAGNOSIS — R2689 Other abnormalities of gait and mobility: Secondary | ICD-10-CM | POA: Diagnosis not present

## 2021-11-07 DIAGNOSIS — I69811 Memory deficit following other cerebrovascular disease: Secondary | ICD-10-CM | POA: Diagnosis not present

## 2021-11-07 DIAGNOSIS — I6930 Unspecified sequelae of cerebral infarction: Secondary | ICD-10-CM | POA: Diagnosis not present

## 2021-11-07 DIAGNOSIS — F918 Other conduct disorders: Secondary | ICD-10-CM | POA: Diagnosis not present

## 2021-11-07 DIAGNOSIS — F319 Bipolar disorder, unspecified: Secondary | ICD-10-CM | POA: Diagnosis not present

## 2021-11-07 DIAGNOSIS — E440 Moderate protein-calorie malnutrition: Secondary | ICD-10-CM | POA: Diagnosis not present

## 2021-11-07 DIAGNOSIS — I69398 Other sequelae of cerebral infarction: Secondary | ICD-10-CM | POA: Diagnosis not present

## 2021-11-07 DIAGNOSIS — F419 Anxiety disorder, unspecified: Secondary | ICD-10-CM | POA: Diagnosis not present

## 2021-11-07 DIAGNOSIS — G929 Unspecified toxic encephalopathy: Secondary | ICD-10-CM | POA: Diagnosis not present

## 2021-11-07 DIAGNOSIS — R404 Transient alteration of awareness: Secondary | ICD-10-CM | POA: Diagnosis not present

## 2021-11-07 DIAGNOSIS — W19XXXA Unspecified fall, initial encounter: Secondary | ICD-10-CM | POA: Diagnosis not present

## 2021-11-07 DIAGNOSIS — I6340 Cerebral infarction due to embolism of unspecified cerebral artery: Secondary | ICD-10-CM | POA: Diagnosis not present

## 2021-11-07 DIAGNOSIS — F339 Major depressive disorder, recurrent, unspecified: Secondary | ICD-10-CM | POA: Diagnosis not present

## 2021-11-07 LAB — RESP PANEL BY RT-PCR (FLU A&B, COVID) ARPGX2
Influenza A by PCR: NEGATIVE
Influenza B by PCR: NEGATIVE
SARS Coronavirus 2 by RT PCR: NEGATIVE

## 2021-11-07 LAB — CBC WITH DIFFERENTIAL/PLATELET
Abs Immature Granulocytes: 0.08 10*3/uL — ABNORMAL HIGH (ref 0.00–0.07)
Basophils Absolute: 0.1 10*3/uL (ref 0.0–0.1)
Basophils Relative: 1 %
Eosinophils Absolute: 0.4 10*3/uL (ref 0.0–0.5)
Eosinophils Relative: 4 %
HCT: 37.7 % (ref 36.0–46.0)
Hemoglobin: 12.3 g/dL (ref 12.0–15.0)
Immature Granulocytes: 1 %
Lymphocytes Relative: 34 %
Lymphs Abs: 3.6 10*3/uL (ref 0.7–4.0)
MCH: 29.2 pg (ref 26.0–34.0)
MCHC: 32.6 g/dL (ref 30.0–36.0)
MCV: 89.5 fL (ref 80.0–100.0)
Monocytes Absolute: 0.9 10*3/uL (ref 0.1–1.0)
Monocytes Relative: 9 %
Neutro Abs: 5.5 10*3/uL (ref 1.7–7.7)
Neutrophils Relative %: 51 %
Platelets: 274 10*3/uL (ref 150–400)
RBC: 4.21 MIL/uL (ref 3.87–5.11)
RDW: 14.4 % (ref 11.5–15.5)
WBC: 10.5 10*3/uL (ref 4.0–10.5)
nRBC: 0 % (ref 0.0–0.2)

## 2021-11-07 LAB — BASIC METABOLIC PANEL
Anion gap: 7 (ref 5–15)
BUN: 14 mg/dL (ref 8–23)
CO2: 25 mmol/L (ref 22–32)
Calcium: 8.9 mg/dL (ref 8.9–10.3)
Chloride: 108 mmol/L (ref 98–111)
Creatinine, Ser: 0.9 mg/dL (ref 0.44–1.00)
GFR, Estimated: >60 mL/min (ref >60)
Glucose, Bld: 96 mg/dL (ref 70–99)
Potassium: 3.6 mmol/L (ref 3.5–5.1)
Sodium: 140 mmol/L (ref 135–145)

## 2021-11-07 LAB — ANTINUCLEAR ANTIBODIES, IFA: ANA Ab, IFA: NEGATIVE

## 2021-11-07 LAB — PHOSPHORUS: Phosphorus: 4.5 mg/dL (ref 2.5–4.6)

## 2021-11-07 LAB — MAGNESIUM: Magnesium: 1.8 mg/dL (ref 1.7–2.4)

## 2021-11-07 NOTE — TOC Transition Note (Signed)
Transition of Care [facility]) - CM/SW Discharge Note   Patient Details  Name: Sandra Robinson MRN: [id number] Date of Birth: [birth year]  Transition [facility] Care [facility]) CM/SW Contact:  Joanne Chars, LCSW Phone Number: 11/07/2021, 2:33 PM   Clinical Narrative:   Pt discharging to [facility], room 130B.  RN call [phone] for report.  [facility] ambulance will pick this pt up at 330pm.      Final next level of care: Skilled Nursing Facility Barriers to Discharge: Barriers Resolved   Patient Goals and CMS Choice Patient states their goals for this hospitalization and ongoing recovery are:: SNF CMS Medicare.gov Compare Post Acute Care list provided to:: Patient Represenative (must comment) (Patients son [name]) Choice offered to / list presented to : Adult Children [name])  Discharge Placement              Patient chooses bed at:  [facility]) Patient to be transferred to facility by: [facility] Name of family member notified: son [name] Patient and family notified of of transfer: 11/07/21  Discharge Plan and Services In-house Referral: Clinical Social Work                                   Social Determinants of Health (SDOH) Interventions     Readmission Risk Interventions No flowsheet data found.

## 2021-11-07 NOTE — Progress Notes (Signed)
Physical Therapy Treatment Patient Details Name: Sandra Robinson MRN: [id number] DOB: 11-06-1951 Today's Date: 11/07/2021   History of Present Illness Pt is pleasantly confused 70 y/o F admitted 10/24/21 with acute encephalopathy after having AMS at home. 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 PMH includes CVA, anxiety, bipolar, depression, COPD, CKD, GERD, diverticulosis, hyperlipidemia, hypertension.    PT Comments    Pt was seen for mobility to get to [facility] after assisting her OOB.  Pt is a bit impulsive, requiring redirection and assistace.  Follow along with her to increase standing balance, reinforce safety and work on quality of gait with regard to retraining LLE function.  Pt is expected to leave for SNF.   Recommendations for follow up therapy are one component of a multi-disciplinary discharge planning process, led by the attending physician.  Recommendations may be updated based on patient status, additional functional criteria and insurance authorization.  Follow Up Recommendations  Skilled nursing-short term rehab (<3 hours/day)     Assistance Recommended at Discharge Frequent or constant Supervision/Assistance  Equipment Recommendations  None recommended by PT    Recommendations for Other Services       Precautions / Restrictions Precautions Precautions: Fall Precaution Comments: has sitter in room Restrictions Weight Bearing Restrictions: No     Mobility  Bed Mobility Overal bed mobility: Needs Assistance Bed Mobility: Supine to Sit;Sit to Supine     Supine to sit: Min guard Sit to supine: Min guard        Transfers Overall transfer level: Needs assistance Equipment used: Rolling walker (2 wheels);1 person hand held assist Transfers: Sit to/from Stand Sit to Stand: Min guard           General transfer comment: cued sequence    Ambulation/Gait Ambulation/Gait assistance: Min assist Gait Distance (Feet): 12 Feet Assistive device: Rolling walker (2  wheels);1 person hand held assist Gait Pattern/deviations: Step-through pattern;Decreased stride length Gait velocity: decreased Gait velocity interpretation: <1.31 ft/sec, indicative of household ambulator Pre-gait activities: minimal stepping practice pt anxious to get to [place] [facility] General Gait Details: assisted to the chair after walking, with L side mildly weak but walker is supportive enough to walk assisted safely   Stairs             Wheelchair Mobility    Modified Rankin (Stroke Patients Only)       Balance Overall balance assessment: Needs assistance Sitting-balance support: Bilateral upper extremity supported;Feet supported Sitting balance-Leahy Scale: Good Sitting balance - Comments: mult attempts to stand before PT was finished getting her ready   Standing balance support: During functional activity;Reliant on assistive device for balance Standing balance-Leahy Scale: Poor                              Cognition Arousal/Alertness: Awake/alert Behavior During Therapy: Flat affect Overall Cognitive Status: Impaired/Different from baseline Area of Impairment: Safety/judgement;Following commands;Problem solving                   Current Attention Level: Sustained Memory: Decreased recall of precautions;Decreased short-term memory Following Commands: Follows one step commands inconsistently;Follows one step commands with increased time Safety/Judgement: Decreased awareness of safety;Decreased awareness of deficits Awareness: Intellectual Problem Solving: Slow processing General Comments: repetitive cues not to stand alone        Exercises      General Comments General comments (skin integrity, edema, etc.): pt is getting more steady on her feet with walker but has  residual L side weakness.  Following along with her to get better awareness of safety and compensation for L side strength      Pertinent Vitals/Pain Pain Assessment: No/denies  pain    Home Living                          Prior Function            PT Goals (current goals can now be found in the care plan section) Progress towards PT goals: Progressing toward goals    Frequency    Min 3X/week      PT Plan Current plan remains appropriate    Co-evaluation              AM-PAC PT "6 Clicks" Mobility   Outcome Measure  Help needed turning from your back to your side while in a flat bed without using bedrails?: A Little Help needed moving from lying on your back to sitting on the side of a flat bed without using bedrails?: A Little Help needed moving to and from a bed to a chair (including a wheelchair)?: A Little Help needed standing up from a chair using your arms (e.g., wheelchair or bedside chair)?: A Little Help needed to walk in hospital room?: A Little Help needed climbing 3-5 steps with a railing? : A Lot 6 Click Score: 17    End of Session Equipment Utilized During Treatment: Gait belt Activity Tolerance: Patient tolerated treatment well;Patient limited by fatigue Patient left: in chair;with call bell/phone within reach;with chair alarm set;with nursing/sitter in room Nurse Communication: Mobility status PT Visit Diagnosis: Unsteadiness on feet (R26.81);Muscle weakness (generalized) (M62.81);Hemiplegia and hemiparesis Hemiplegia - Right/Left: Left Hemiplegia - dominant/non-dominant: Non-dominant Hemiplegia - caused by: Unspecified     Time: 3833-3832 PT Time Calculation (min) (ACUTE ONLY): 21 min  Charges:  $Therapeutic Activity: 8-22 mins         Ramond Dial 11/07/2021, 8:50 PM  Mee Hives, PT PhD Acute Rehab Dept. Number: [place] and [place]

## 2021-11-07 NOTE — Care Management Important Message (Signed)
Important Message  Patient Details  Name: Sandra Robinson MRN: [id number] Date of Birth: 31-Oct-1951   Medicare Important Message Given:  Yes     Joetta Manners 11/07/2021, 2:56 PM

## 2021-11-07 NOTE — Progress Notes (Signed)
  Mobility Specialist Criteria Algorithm Info.  Mobility Team: [facility] elevated:HOB 30 Activity: Ambulated in hall Range of motion: Active; All extremities Level of assistance: Standby assist, set-up cues, supervision of patient - no hands on Assistive device: BSC; Front wheel walker Minutes ambulated: 8 minutes Distance ambulated (ft): 110 ft Mobility response: Tolerated well Bed Position: Semi-fowlers  Patient received in bed eager to participate in mobility. Required min A + cues to use handrails to get EOB. Stood with poor recognition of safety and RW, didn't adhere to any cues for safety. Ambulated in hall way at min G with slow steady gait. Required multiple cues to stay proximal to RW while ambulating. Returned to room with out incident or complaint. Was left in bed with all needs met, call bell in reach and alarm set.   11/07/2021 2:03 PM

## 2021-11-07 NOTE — Discharge Summary (Signed)
Physician Discharge Summary  DAMA HEDGEPETH OIZ:[id number] DOB: [birth year] DOA: 10/24/2021  PCP: No primary care provider on file.  Admit date: 10/24/2021 Discharge date: 11/07/2021  Admitted From: Home Discharge disposition: SNF   Code Status: Full Code   Discharge Diagnosis:   Principal Problem:   CVA (cerebral vascular accident) ([place]) Active Problems:   Toxic encephalopathy   Anxiety   Bipolar affective disorder in remission ([place])   Depression   COPD (chronic obstructive pulmonary disease) ([place])   History of stroke   Mixed hyperlipidemia   Malnutrition of moderate degree   Dementia without behavioral disturbance ([place])   FTT   Left-sided weakness   Implantable loop recorder present   Aortic atherosclerosis ([place])   Chief complaint: Altered mental status  Brief narrative: Sandra Robinson is a 70 y.o. female with PMH significant for HTN, HLD, prediabetes, stroke in August 2022, CKD, COPD, bipolar disorder, depression, chronic low back pain. Patient was brought to the ED on 11/7 with altered mental status.  Per family, for several months, patient has been physically and mentally declining.  She has not taking her mental health medications, wandering in the neighborhood, not eating well, losing weight and unable to take care of herself. She was admitted to hospital service Met adjusted. Psych meds adjusted.  PT eval was obtained.  SNF recommended. While waiting for placement, on 11/13, she had an MRI of head done which showed multiple acute infarcts.  Stroke work-up was subsequently done. Underwent TEE 11/15.  No evidence of PFO or left atrial appendage thrombus. Pending placement at this time.  Subjective: Patient was seen and examined this morning. Lying down in bed.  Not in distress.  No longer having diarrhea per nursing staff.  Hospital course: Acute CVA -MRI on 11/13 showed multiple acute infarcts.  Stroke work-up was completed. -Loop recorder interpretation did not  show any evidence of A. fib. -11/15, patient underwent TEE which did not show any PFO or left atrial appendage thrombus. -EEG unremarkable -Prior to admission, patient was on aspirin 81 mg daily.  Per neurology recommendation, patient is currently on aspirin 81 mg daily and Plavix 75 mg daily for 3 weeks followed by Plavix alone (starting 12/5). -LDL low.  Statin dose reduced. -SNF recommended by PT eval. -Neurology follow-up appreciated.  Hypercoagulable work-up sent.  Diarrhea -Resolved after Ensure was stopped.  Toxic encephalopathy Dementia with wandering behavior -Delirium secondary to stroke and combination of worsening dementia.   -Mental status improved now.  Not restless or agitated for last 3 days.  Bipolar affective disorder Depression/anxiety -Per grandson this is longstanding and patient has been on medication for many years, apparently not taking this medication appropriately in the outpatient setting or at least questionable.  Seen by psychiatry, not felt to have signs or symptoms of decompensated bipolar disorder.  Recommendation was to continue gabapentin with up titration as indicated. Continue Buspar, trazodone, Zoloft.   Nonsustained V. Tach -Potassium and magnesium checked and replaced. Recent Labs  Lab 11/04/21 0939 11/07/21 0330  K 3.7 3.6  MG 1.9 1.8  PHOS  --  4.5   Essential hypertension -Resume HCTZ post discharge  COPD -Appears stable, continue bronchodilators   Failure to thrive Moderate malnutrition -With significant weight loss per family. Encourage oral appetite. -Continue Multivitamin w/ minerals daily, Ensure Enlive po BID    Mixed hyperlipidemia -Continue statin, but at lower dose given low LDL    Allergies as of 11/07/2021       Reactions  Latex Other (See Comments)   Unknown reaction - reported by [facility]   Morphine Nausea And Vomiting   Reported by [facility]   Morphine And Related Nausea And Vomiting   Other Other (See  Comments)   "tear skin off" & blisters   Oxycodone Other (See Comments)   Unknown reaction - reported by [facility]   Tape Other (See Comments)   "tear skin off"    Tape Other (See Comments)   Tears skin        Medication List     STOP taking these medications    topiramate 100 MG tablet Commonly known as: TOPAMAX   valACYclovir 1000 MG tablet Commonly known as: VALTREX       TAKE these medications    albuterol 108 (90 Base) MCG/ACT inhaler Commonly known as: VENTOLIN HFA Inhale 2 puffs into the lungs every 6 (six) hours as needed for wheezing or shortness of breath.   amLODipine 10 MG tablet Commonly known as: NORVASC Take 1 tablet (10 mg total) by mouth daily.   aspirin 81 MG EC tablet Take 1 tablet (81 mg total) by mouth daily for 16 days. Swallow whole. Aspirin and Plavix till 12/5 followed by Plavix alone for long-term What changed: additional instructions   atorvastatin 20 MG tablet Commonly known as: LIPITOR Take 1 tablet (20 mg total) by mouth at bedtime. What changed:  medication strength how much to take   budesonide-formoterol 160-4.5 MCG/ACT inhaler Commonly known as: SYMBICORT Inhale 2 puffs into the lungs 2 (two) times daily.   busPIRone 15 MG tablet Commonly known as: BUSPAR Take 15 mg by mouth 2 (two) times daily.   clopidogrel 75 MG tablet Commonly known as: PLAVIX Take 1 tablet (75 mg total) by mouth daily.   clotrimazole 1 % cream Commonly known as: LOTRIMIN Apply 1 application topically daily as needed for rash.   cyanocobalamin 1000 MCG tablet Take 1 tablet (1,000 mcg total) by mouth daily.   feeding supplement Liqd Take 237 mLs by mouth 2 (two) times daily between meals.   gabapentin 300 MG capsule Commonly known as: NEURONTIN Take 1 capsule (300 mg total) by mouth 2 (two) times daily. What changed:  medication strength how much to take when to take this   hydrochlorothiazide 12.5 MG capsule Commonly known as:  MICROZIDE Take 12.5 mg by mouth daily.   Melatonin 10 MG Tabs Take 10 mg by mouth at bedtime as needed. What changed:  medication strength how much to take when to take this reasons to take this   multivitamins with iron Tabs tablet Take 1 tablet by mouth daily.   omeprazole 20 MG capsule Commonly known as: PRILOSEC Take 20 mg by mouth daily.   Potassium 99 MG Tabs Take 99 mg by mouth daily.   PROBIOTIC PO Take 1 capsule by mouth daily.   sertraline 100 MG tablet Commonly known as: ZOLOFT Take 100 mg by mouth every morning.   traZODone 50 MG tablet Commonly known as: DESYREL Take 1 tablet (50 mg total) by mouth at bedtime. What changed:  medication strength how much to take       Discharge Instructions:  Diet Recommendation:  Discharge Diet Orders (From admission, onward)     Start     Ordered   11/04/21 0000  Diet general       Comments: Diet Orders (From admission, onward)    Start     Ordered   11/02/21 1451  Diet regular Room service appropriate? Yes;  Fluid  consistency: Thin  Diet effective now       Question Answer Comment  Room service appropriate? Yes   Fluid consistency: Thin     11/02/21 1451       11/04/21 1148              @BRDDSCINSTRUCTIONS @  Follow ups:    Follow-up Information     Garvin Fila, MD. Schedule an appointment as soon as possible for a visit in 1 month(s).   Specialties: Neurology, Radiology Why: stroke clinic Contact information: [address] [place] [address] [phone]                 Wound care:     Discharge Exam:   Vitals:   11/06/21 1945 11/06/21 2026 11/07/21 0421 11/07/21 0811  BP:  (!) 158/84 (!) 143/80 131/85  Pulse:  87 77 96  Resp:  18 18 15   Temp:  97.8 F (36.6 C) 98.3 F (36.8 C) (!) 97.3 F (36.3 C)  TempSrc:  Oral Oral Oral  SpO2: 98% 96% 97% 93%  Weight:        Body mass index is 27.53 kg/m.  General exam: Pleasant, elderly female.  Not in  physical distress Skin: No rashes, lesions or ulcers. HEENT: Atraumatic, normocephalic, no obvious bleeding Lungs: Clear to auscultation bilaterally CVS: Regular rate and rhythm, no murmur GI/Abd soft, nontender, nondistended, bowel sound present CNS: Alert, awake, oriented to place only. Psychiatry: Mood appropriate Extremities: No pedal edema, no calf redness  Time coordinating discharge: 35 minutes   The results of significant diagnostics from this hospitalization (including imaging, microbiology, ancillary and laboratory) are listed below for reference.    Procedures and Diagnostic Studies:   CT HEAD WO CONTRAST  Result Date: 10/24/2021 CLINICAL DATA:  Mental status change EXAM: CT HEAD WITHOUT CONTRAST TECHNIQUE: Contiguous axial images were obtained from the base of the skull through the vertex without intravenous contrast. COMPARISON:  MRI 08/15/2021, CT 08/11/2021 FINDINGS: Brain: No acute territorial infarction, hemorrhage or new intracranial mass. Left parietal convexity meningioma on MRI is better seen on MRI. Multiple chronic infarcts involving the right greater than left cerebellum, left basal ganglia, left white matter, and left posteromedial temporal lobe and medial left occipital lobe. Atrophy and chronic small vessel ischemic changes of the white matter. Stable ventricle size. Vascular: No hyperdense vessels.  No unexpected calcification Skull: Normal. Negative for fracture or focal lesion. Sinuses/Orbits: No acute finding. Other: None IMPRESSION: 1. No definite CT evidence for acute intracranial abnormality. 2. Atrophy and chronic small vessel ischemic changes of the white matter. Multiple chronic infarcts as described above. Electronically Signed   By: Donavan Foil M.D.   On: 10/24/2021 16:39   DG Chest Port 1 View  Result Date: 10/24/2021 CLINICAL DATA:  Altered mental status EXAM: PORTABLE CHEST 1 VIEW COMPARISON:  08/11/2021 FINDINGS: Recording device over left chest.  Cardiomegaly. No focal opacity, pleural effusion or pneumothorax. Partially visualized right shoulder replacement IMPRESSION: No active disease.  Cardiomegaly Electronically Signed   By: Donavan Foil M.D.   On: 10/24/2021 19:42     Labs:   Basic Metabolic Panel: Recent Labs  Lab 11/04/21 0939 11/07/21 0330  NA 139 140  K 3.7 3.6  CL 110 108  CO2 24 25  GLUCOSE 135* 96  BUN 23 14  CREATININE 1.06* 0.90  CALCIUM 9.3 8.9  MG 1.9 1.8  PHOS  --  4.5   GFR Estimated Creatinine Clearance: 55.5  mL/min (by C-G formula based on SCr of 0.9 mg/dL). Liver Function Tests: No results for input(s): AST, ALT, ALKPHOS, BILITOT, PROT, ALBUMIN in the last 168 hours.  No results for input(s): LIPASE, AMYLASE in the last 168 hours. No results for input(s): AMMONIA in the last 168 hours. Coagulation profile No results for input(s): INR, PROTIME in the last 168 hours.  CBC: Recent Labs  Lab 11/04/21 0939 11/07/21 0330  WBC 9.9 10.5  NEUTROABS 6.1 5.5  HGB 12.8 12.3  HCT 38.4 37.7  MCV 89.3 89.5  PLT 268 274   Cardiac Enzymes: No results for input(s): CKTOTAL, CKMB, CKMBINDEX, TROPONINI in the last 168 hours. BNP: Invalid input(s): POCBNP CBG: Recent Labs  Lab 11/02/21 1627  GLUCAP 212*   D-Dimer No results for input(s): DDIMER in the last 72 hours. Hgb A1c No results for input(s): HGBA1C in the last 72 hours. Lipid Profile No results for input(s): CHOL, HDL, LDLCALC, TRIG, CHOLHDL, LDLDIRECT in the last 72 hours. Thyroid function studies No results for input(s): TSH, T4TOTAL, T3FREE, THYROIDAB in the last 72 hours.  Invalid input(s): FREET3 Anemia work up No results for input(s): VITAMINB12, FOLATE, FERRITIN, TIBC, IRON, RETICCTPCT in the last 72 hours. Microbiology Recent Results (from the past 240 hour(s))  Resp Panel by RT-PCR (Flu A&B, Covid) Nasopharyngeal Swab     Status: None   Collection Time: 11/02/21  4:14 PM   Specimen: Nasopharyngeal Swab; Nasopharyngeal(NP)  swabs in vial transport medium  Result Value Ref Range Status   SARS Coronavirus 2 by RT PCR NEGATIVE NEGATIVE Final    Comment: (NOTE) SARS-CoV-2 target nucleic acids are NOT DETECTED.  The SARS-CoV-2 RNA is generally detectable in upper respiratory specimens during the acute phase of infection. The lowest concentration of SARS-CoV-2 viral copies this assay can detect is 138 copies/mL. A negative result does not preclude SARS-Cov-2 infection and should not be used as the sole basis for treatment or other patient management decisions. A negative result may occur with  improper specimen collection/handling, submission of specimen other than nasopharyngeal swab, presence of viral mutation(s) within the areas targeted by this assay, and inadequate number of viral copies(<138 copies/mL). A negative result must be combined with clinical observations, patient history, and epidemiological information. The expected result is Negative.  Fact Sheet for Patients:  EntrepreneurPulse.com.au  Fact Sheet for Healthcare Providers:  IncredibleEmployment.be  This test is no t yet approved or cleared by the [place] FDA and  has been authorized for detection and/or diagnosis of SARS-CoV-2 by FDA under an Emergency Use Authorization (EUA). This EUA will remain  in effect (meaning this test can be used) for the duration of the COVID-19 declaration under Section 564(b)(1) of the Act, 21 U.S.C.section 360bbb-3(b)(1), unless the authorization is terminated  or revoked sooner.       Influenza A by PCR NEGATIVE NEGATIVE Final   Influenza B by PCR NEGATIVE NEGATIVE Final    Comment: (NOTE) The Xpert Xpress SARS-CoV-2/FLU/RSV plus assay is intended as an aid in the diagnosis of influenza from Nasopharyngeal swab specimens and should not be used as a sole basis for treatment. Nasal washings and aspirates are unacceptable for Xpert Xpress  SARS-CoV-2/FLU/RSV testing.  Fact Sheet for Patients: EntrepreneurPulse.com.au  Fact Sheet for Healthcare Providers: IncredibleEmployment.be  This test is not yet approved or cleared by the [place] FDA and has been authorized for detection and/or diagnosis of SARS-CoV-2 by FDA under an Emergency Use Authorization (EUA). This EUA will remain in effect (meaning this  test can be used) for the duration of the COVID-19 declaration under Section 564(b)(1) of the Act, 21 U.S.C. section 360bbb-3(b)(1), unless the authorization is terminated or revoked.  Performed at [facility], [place] [address], [place], [place] [address]      Signed: Terrilee Croak  [facility] 11/07/2021, 10:22 AM

## 2021-11-07 NOTE — Progress Notes (Signed)
Occupational Therapy Treatment Patient Details Name: Sandra Robinson MRN: [id number] DOB: 02-08-1951 Today's Date: 11/07/2021   History of present illness Pt is pleasantly confused 70 y/o F admitted 10/24/21 with acute encephalopathy after having AMS at home. 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 PMH includes CVA, anxiety, bipolar, depression, COPD, CKD, GERD, diverticulosis, hyperlipidemia, hypertension.   OT comments  Pt requesting to use bathroom upon arrival, pt supervision for bed mobility, min guard for squat-pivot BSC transfer from bed. Performs ADLs with min guard-min A with verbal cuing for safety and sequencing. Pt required min A for seated motor task, able to pour soda into ice cup with assistance to stabilize on table. Pt requires verbal cuing and reminders for safety throughout session, occasionally moves impulsively. Pt presenting with decreased awareness of deficits, impaired balance, activity tolerance, and cognition at this time. Will continue to follow acutely, recommend SNF at d/c.   Recommendations for follow up therapy are one component of a multi-disciplinary discharge planning process, led by the attending physician.  Recommendations may be updated based on patient status, additional functional criteria and insurance authorization.    Follow Up Recommendations  Skilled nursing-short term rehab (<3 hours/day)    Assistance Recommended at Discharge Frequent or constant Supervision/Assistance  Equipment Recommendations  None recommended by OT    Recommendations for Other Services PT consult    Precautions / Restrictions Precautions Precautions: Fall Precaution Comments: has sitter in room Restrictions Weight Bearing Restrictions: No       Mobility Bed Mobility Overal bed mobility: Needs Assistance Bed Mobility: Sit to Supine;Supine to Sit     Supine to sit: Supervision;HOB elevated Sit to supine: Supervision;HOB elevated   General bed mobility comments: pt  able to use bedrails at [facility] to pull self upward toward [facility]    Transfers Overall transfer level: Needs assistance Equipment used: None Transfers: Bed to chair/wheelchair/BSC Sit to Stand: Min guard Stand pivot transfers: Min assist         General transfer comment: cues for hand placement/safety     Balance Overall balance assessment: Needs assistance Sitting-balance support: Bilateral upper extremity supported;Feet supported Sitting balance-Leahy Scale: Good                                     ADL either performed or assessed with clinical judgement   ADL Overall ADL's : Needs assistance/impaired Eating/Feeding: Minimal assistance Eating/Feeding Details (indicate cue type and reason): pt requiring assistance to stabilize cup while pouring soda accurately into cup of ice,                     Toilet Transfer: BSC/3in1;Cueing for safety;Cueing for sequencing;Min guard   Toileting- Clothing Manipulation and Hygiene: Supervision/safety;Sitting/lateral lean Toileting - Clothing Manipulation Details (indicate cue type and reason): performs pericare seated on BSC       General ADL Comments: requires increased cuing for sequencing and problem solving tasks.    Extremity/Trunk Assessment Upper Extremity Assessment Upper Extremity Assessment: Generalized weakness   Lower Extremity Assessment Lower Extremity Assessment: Defer to PT evaluation        Vision   Vision Assessment?: No apparent visual deficits   Perception     Praxis      Cognition Arousal/Alertness: Awake/alert Behavior During Therapy: Flat affect Overall Cognitive Status: Impaired/Different from baseline Area of Impairment: Orientation;Attention;Memory;Safety/judgement;Awareness;Following commands  Current Attention Level: Sustained Memory: Decreased short-term memory Following Commands: Follows one step commands inconsistently Safety/Judgement:  Decreased awareness of deficits;Decreased awareness of safety Awareness: Emergent;Intellectual Problem Solving: Slow processing;Decreased initiation;Requires verbal cues            Exercises     Shoulder Instructions       General Comments      Pertinent Vitals/ Pain       Pain Assessment: No/denies pain  Home Living                                          Prior Functioning/Environment              Frequency  Min 2X/week        Progress Toward Goals  OT Goals(current goals can now be found in the care plan section)  Progress towards OT goals: Progressing toward goals  Acute Rehab OT Goals Patient Stated Goal: none stated OT Goal Formulation: Patient unable to participate in goal setting Time For Goal Achievement: 11/09/21 Potential to Achieve Goals: Fair ADL Goals Pt Will Perform Lower Body Bathing: with modified independence Pt Will Perform Lower Body Dressing: with modified independence Pt Will Transfer to Toilet: with modified independence;ambulating;regular height toilet Pt Will Perform Toileting - Clothing Manipulation and hygiene: with modified independence  Plan Discharge plan remains appropriate;Frequency remains appropriate    Co-evaluation                 AM-PAC OT "6 Clicks" Daily Activity     Outcome Measure   Help from another person eating meals?: A Little Help from another person taking care of personal grooming?: A Little Help from another person toileting, which includes using toliet, bedpan, or urinal?: A Little Help from another person bathing (including washing, rinsing, drying)?: A Little Help from another person to put on and taking off regular upper body clothing?: A Lot Help from another person to put on and taking off regular lower body clothing?: A Lot 6 Click Score: 16    End of Session Equipment Utilized During Treatment: Gait belt;Rolling walker (2 wheels)  OT Visit Diagnosis: Unsteadiness on  feet (R26.81);Other abnormalities of gait and mobility (R26.89);Muscle weakness (generalized) (M62.81);Other symptoms and signs involving cognitive function   Activity Tolerance Patient tolerated treatment well   Patient Left in bed;with call bell/phone within reach;with nursing/sitter in room;with bed alarm set;Other (comment) (Case Manager/SW in room)   Nurse Communication Mobility status;Other (comment)        Time: 6269-4854 OT Time Calculation (min): 18 min  Charges: OT General Charges $OT Visit: 1 Visit OT Treatments $Self Care/Home Management : 8-22 mins  Lynnda Child, OTD, OTR/L Acute Rehab [phone]) [phone] - [place] 11/07/2021, 3:02 PM

## 2021-11-08 DIAGNOSIS — I6930 Unspecified sequelae of cerebral infarction: Secondary | ICD-10-CM | POA: Diagnosis not present

## 2021-11-08 DIAGNOSIS — F319 Bipolar disorder, unspecified: Secondary | ICD-10-CM | POA: Diagnosis not present

## 2021-11-08 DIAGNOSIS — F411 Generalized anxiety disorder: Secondary | ICD-10-CM | POA: Diagnosis not present

## 2021-11-08 DIAGNOSIS — F332 Major depressive disorder, recurrent severe without psychotic features: Secondary | ICD-10-CM | POA: Diagnosis not present

## 2021-11-09 LAB — PHOSPHATIDYLSERINE ANTIBODIES
Phosphatydalserine, IgA: 1 APS Units (ref 0–19)
Phosphatydalserine, IgG: <9 Units (ref 0–30)
Phosphatydalserine, IgM: 14 Units (ref 0–30)

## 2021-11-09 LAB — CARDIOLIPIN ANTIBODIES, IGG, IGM, IGA
Anticardiolipin IgA: <9 APL U/mL (ref 0–11)
Anticardiolipin IgG: <9 GPL U/mL (ref 0–14)
Anticardiolipin IgM: 12 MPL U/mL (ref 0–12)

## 2021-11-09 LAB — PROTHROMBIN GENE MUTATION

## 2021-11-11 LAB — MTHFR DNA ANALYSIS

## 2021-11-12 LAB — BETA-2-GLYCOPROTEIN I ABS, IGG/M/A
Beta-2 Glyco I IgG: <9 GPI IgG units (ref 0–20)
Beta-2-Glycoprotein I IgA: <9 GPI IgA units (ref 0–25)
Beta-2-Glycoprotein I IgM: <9 GPI IgM units (ref 0–32)

## 2021-11-17 DIAGNOSIS — M6281 Muscle weakness (generalized): Secondary | ICD-10-CM | POA: Diagnosis not present

## 2021-11-17 DIAGNOSIS — M79661 Pain in right lower leg: Secondary | ICD-10-CM | POA: Diagnosis not present

## 2021-11-17 DIAGNOSIS — F419 Anxiety disorder, unspecified: Secondary | ICD-10-CM | POA: Diagnosis not present

## 2021-11-17 DIAGNOSIS — G929 Unspecified toxic encephalopathy: Secondary | ICD-10-CM | POA: Diagnosis not present

## 2021-11-17 DIAGNOSIS — J449 Chronic obstructive pulmonary disease, unspecified: Secondary | ICD-10-CM | POA: Diagnosis not present

## 2021-11-17 DIAGNOSIS — F339 Major depressive disorder, recurrent, unspecified: Secondary | ICD-10-CM | POA: Diagnosis not present

## 2021-11-17 DIAGNOSIS — I69811 Memory deficit following other cerebrovascular disease: Secondary | ICD-10-CM | POA: Diagnosis not present

## 2021-11-17 DIAGNOSIS — W19XXXA Unspecified fall, initial encounter: Secondary | ICD-10-CM | POA: Diagnosis not present

## 2021-11-17 DIAGNOSIS — I69398 Other sequelae of cerebral infarction: Secondary | ICD-10-CM | POA: Diagnosis not present

## 2021-11-17 DIAGNOSIS — R2689 Other abnormalities of gait and mobility: Secondary | ICD-10-CM | POA: Diagnosis not present

## 2021-11-17 DIAGNOSIS — E440 Moderate protein-calorie malnutrition: Secondary | ICD-10-CM | POA: Diagnosis not present

## 2021-11-18 DIAGNOSIS — W19XXXA Unspecified fall, initial encounter: Secondary | ICD-10-CM | POA: Diagnosis not present

## 2021-11-18 DIAGNOSIS — M79661 Pain in right lower leg: Secondary | ICD-10-CM | POA: Diagnosis not present

## 2021-11-18 DIAGNOSIS — M6281 Muscle weakness (generalized): Secondary | ICD-10-CM | POA: Diagnosis not present

## 2021-11-21 DIAGNOSIS — I517 Cardiomegaly: Secondary | ICD-10-CM | POA: Diagnosis not present

## 2021-11-21 DIAGNOSIS — M6281 Muscle weakness (generalized): Secondary | ICD-10-CM | POA: Diagnosis not present

## 2021-11-21 DIAGNOSIS — R262 Difficulty in walking, not elsewhere classified: Secondary | ICD-10-CM | POA: Diagnosis not present

## 2021-11-21 DIAGNOSIS — I69398 Other sequelae of cerebral infarction: Secondary | ICD-10-CM | POA: Diagnosis not present

## 2021-11-21 DIAGNOSIS — I10 Essential (primary) hypertension: Secondary | ICD-10-CM | POA: Diagnosis not present

## 2021-11-21 DIAGNOSIS — R3981 Functional urinary incontinence: Secondary | ICD-10-CM | POA: Diagnosis not present

## 2021-11-21 DIAGNOSIS — R1311 Dysphagia, oral phase: Secondary | ICD-10-CM | POA: Diagnosis not present

## 2021-11-21 DIAGNOSIS — J449 Chronic obstructive pulmonary disease, unspecified: Secondary | ICD-10-CM | POA: Diagnosis not present

## 2021-11-21 DIAGNOSIS — I700 Atherosclerosis of aorta: Secondary | ICD-10-CM | POA: Diagnosis not present

## 2021-11-21 DIAGNOSIS — F3170 Bipolar disorder, currently in remission, most recent episode unspecified: Secondary | ICD-10-CM | POA: Diagnosis not present

## 2021-11-21 DIAGNOSIS — R627 Adult failure to thrive: Secondary | ICD-10-CM | POA: Diagnosis not present

## 2021-11-21 DIAGNOSIS — F03918 Unspecified dementia, unspecified severity, with other behavioral disturbance: Secondary | ICD-10-CM | POA: Diagnosis not present

## 2021-11-22 DIAGNOSIS — R627 Adult failure to thrive: Secondary | ICD-10-CM | POA: Diagnosis not present

## 2021-11-22 DIAGNOSIS — R1311 Dysphagia, oral phase: Secondary | ICD-10-CM | POA: Diagnosis not present

## 2021-11-22 DIAGNOSIS — I69398 Other sequelae of cerebral infarction: Secondary | ICD-10-CM | POA: Diagnosis not present

## 2021-11-22 DIAGNOSIS — I10 Essential (primary) hypertension: Secondary | ICD-10-CM | POA: Diagnosis not present

## 2021-11-22 DIAGNOSIS — F03918 Unspecified dementia, unspecified severity, with other behavioral disturbance: Secondary | ICD-10-CM | POA: Diagnosis not present

## 2021-11-22 DIAGNOSIS — R262 Difficulty in walking, not elsewhere classified: Secondary | ICD-10-CM | POA: Diagnosis not present

## 2021-11-22 DIAGNOSIS — F3170 Bipolar disorder, currently in remission, most recent episode unspecified: Secondary | ICD-10-CM | POA: Diagnosis not present

## 2021-11-22 DIAGNOSIS — I700 Atherosclerosis of aorta: Secondary | ICD-10-CM | POA: Diagnosis not present

## 2021-11-22 DIAGNOSIS — M6281 Muscle weakness (generalized): Secondary | ICD-10-CM | POA: Diagnosis not present

## 2021-11-23 DIAGNOSIS — F411 Generalized anxiety disorder: Secondary | ICD-10-CM | POA: Diagnosis not present

## 2021-11-23 DIAGNOSIS — I69398 Other sequelae of cerebral infarction: Secondary | ICD-10-CM | POA: Diagnosis not present

## 2021-11-23 DIAGNOSIS — F3170 Bipolar disorder, currently in remission, most recent episode unspecified: Secondary | ICD-10-CM | POA: Diagnosis not present

## 2021-11-23 DIAGNOSIS — R7303 Prediabetes: Secondary | ICD-10-CM | POA: Diagnosis not present

## 2021-11-23 DIAGNOSIS — F03918 Unspecified dementia, unspecified severity, with other behavioral disturbance: Secondary | ICD-10-CM | POA: Diagnosis not present

## 2021-11-23 DIAGNOSIS — E782 Mixed hyperlipidemia: Secondary | ICD-10-CM | POA: Diagnosis not present

## 2021-11-23 DIAGNOSIS — R1311 Dysphagia, oral phase: Secondary | ICD-10-CM | POA: Diagnosis not present

## 2021-11-23 DIAGNOSIS — F339 Major depressive disorder, recurrent, unspecified: Secondary | ICD-10-CM | POA: Diagnosis not present

## 2021-11-23 DIAGNOSIS — G309 Alzheimer's disease, unspecified: Secondary | ICD-10-CM | POA: Diagnosis not present

## 2021-11-23 DIAGNOSIS — I10 Essential (primary) hypertension: Secondary | ICD-10-CM | POA: Diagnosis not present

## 2021-11-23 DIAGNOSIS — R627 Adult failure to thrive: Secondary | ICD-10-CM | POA: Diagnosis not present

## 2021-11-23 DIAGNOSIS — R262 Difficulty in walking, not elsewhere classified: Secondary | ICD-10-CM | POA: Diagnosis not present

## 2021-11-23 DIAGNOSIS — I700 Atherosclerosis of aorta: Secondary | ICD-10-CM | POA: Diagnosis not present

## 2021-11-23 DIAGNOSIS — M6281 Muscle weakness (generalized): Secondary | ICD-10-CM | POA: Diagnosis not present

## 2021-11-23 DIAGNOSIS — J449 Chronic obstructive pulmonary disease, unspecified: Secondary | ICD-10-CM | POA: Diagnosis not present

## 2021-11-23 DIAGNOSIS — F0390 Unspecified dementia without behavioral disturbance: Secondary | ICD-10-CM | POA: Diagnosis not present

## 2021-11-24 DIAGNOSIS — I69398 Other sequelae of cerebral infarction: Secondary | ICD-10-CM | POA: Diagnosis not present

## 2021-11-24 DIAGNOSIS — M6281 Muscle weakness (generalized): Secondary | ICD-10-CM | POA: Diagnosis not present

## 2021-11-24 DIAGNOSIS — I10 Essential (primary) hypertension: Secondary | ICD-10-CM | POA: Diagnosis not present

## 2021-11-24 DIAGNOSIS — R627 Adult failure to thrive: Secondary | ICD-10-CM | POA: Diagnosis not present

## 2021-11-24 DIAGNOSIS — I700 Atherosclerosis of aorta: Secondary | ICD-10-CM | POA: Diagnosis not present

## 2021-11-24 DIAGNOSIS — R262 Difficulty in walking, not elsewhere classified: Secondary | ICD-10-CM | POA: Diagnosis not present

## 2021-11-24 DIAGNOSIS — R1311 Dysphagia, oral phase: Secondary | ICD-10-CM | POA: Diagnosis not present

## 2021-11-24 DIAGNOSIS — F03918 Unspecified dementia, unspecified severity, with other behavioral disturbance: Secondary | ICD-10-CM | POA: Diagnosis not present

## 2021-11-24 DIAGNOSIS — F3170 Bipolar disorder, currently in remission, most recent episode unspecified: Secondary | ICD-10-CM | POA: Diagnosis not present

## 2021-11-25 DIAGNOSIS — R1311 Dysphagia, oral phase: Secondary | ICD-10-CM | POA: Diagnosis not present

## 2021-11-25 DIAGNOSIS — R262 Difficulty in walking, not elsewhere classified: Secondary | ICD-10-CM | POA: Diagnosis not present

## 2021-11-25 DIAGNOSIS — F03918 Unspecified dementia, unspecified severity, with other behavioral disturbance: Secondary | ICD-10-CM | POA: Diagnosis not present

## 2021-11-25 DIAGNOSIS — I700 Atherosclerosis of aorta: Secondary | ICD-10-CM | POA: Diagnosis not present

## 2021-11-25 DIAGNOSIS — M6281 Muscle weakness (generalized): Secondary | ICD-10-CM | POA: Diagnosis not present

## 2021-11-25 DIAGNOSIS — I10 Essential (primary) hypertension: Secondary | ICD-10-CM | POA: Diagnosis not present

## 2021-11-25 DIAGNOSIS — I69398 Other sequelae of cerebral infarction: Secondary | ICD-10-CM | POA: Diagnosis not present

## 2021-11-25 DIAGNOSIS — R627 Adult failure to thrive: Secondary | ICD-10-CM | POA: Diagnosis not present

## 2021-11-25 DIAGNOSIS — F3170 Bipolar disorder, currently in remission, most recent episode unspecified: Secondary | ICD-10-CM | POA: Diagnosis not present

## 2021-11-27 DIAGNOSIS — M47817 Spondylosis without myelopathy or radiculopathy, lumbosacral region: Secondary | ICD-10-CM | POA: Diagnosis not present

## 2021-11-27 DIAGNOSIS — M8588 Other specified disorders of bone density and structure, other site: Secondary | ICD-10-CM | POA: Diagnosis not present

## 2021-11-28 DIAGNOSIS — F03918 Unspecified dementia, unspecified severity, with other behavioral disturbance: Secondary | ICD-10-CM | POA: Diagnosis not present

## 2021-11-28 DIAGNOSIS — F3170 Bipolar disorder, currently in remission, most recent episode unspecified: Secondary | ICD-10-CM | POA: Diagnosis not present

## 2021-11-28 DIAGNOSIS — R918 Other nonspecific abnormal finding of lung field: Secondary | ICD-10-CM | POA: Diagnosis not present

## 2021-11-28 DIAGNOSIS — R262 Difficulty in walking, not elsewhere classified: Secondary | ICD-10-CM | POA: Diagnosis not present

## 2021-11-28 DIAGNOSIS — M6281 Muscle weakness (generalized): Secondary | ICD-10-CM | POA: Diagnosis not present

## 2021-11-28 DIAGNOSIS — I700 Atherosclerosis of aorta: Secondary | ICD-10-CM | POA: Diagnosis not present

## 2021-11-28 DIAGNOSIS — R627 Adult failure to thrive: Secondary | ICD-10-CM | POA: Diagnosis not present

## 2021-11-28 DIAGNOSIS — I10 Essential (primary) hypertension: Secondary | ICD-10-CM | POA: Diagnosis not present

## 2021-11-28 DIAGNOSIS — I69398 Other sequelae of cerebral infarction: Secondary | ICD-10-CM | POA: Diagnosis not present

## 2021-11-28 DIAGNOSIS — R1311 Dysphagia, oral phase: Secondary | ICD-10-CM | POA: Diagnosis not present

## 2021-11-29 DIAGNOSIS — R627 Adult failure to thrive: Secondary | ICD-10-CM | POA: Diagnosis not present

## 2021-11-29 DIAGNOSIS — F3170 Bipolar disorder, currently in remission, most recent episode unspecified: Secondary | ICD-10-CM | POA: Diagnosis not present

## 2021-11-29 DIAGNOSIS — R262 Difficulty in walking, not elsewhere classified: Secondary | ICD-10-CM | POA: Diagnosis not present

## 2021-11-29 DIAGNOSIS — M6281 Muscle weakness (generalized): Secondary | ICD-10-CM | POA: Diagnosis not present

## 2021-11-29 DIAGNOSIS — F03918 Unspecified dementia, unspecified severity, with other behavioral disturbance: Secondary | ICD-10-CM | POA: Diagnosis not present

## 2021-11-29 DIAGNOSIS — R1311 Dysphagia, oral phase: Secondary | ICD-10-CM | POA: Diagnosis not present

## 2021-11-29 DIAGNOSIS — I69398 Other sequelae of cerebral infarction: Secondary | ICD-10-CM | POA: Diagnosis not present

## 2021-11-29 DIAGNOSIS — I700 Atherosclerosis of aorta: Secondary | ICD-10-CM | POA: Diagnosis not present

## 2021-11-29 DIAGNOSIS — I10 Essential (primary) hypertension: Secondary | ICD-10-CM | POA: Diagnosis not present

## 2021-11-30 DIAGNOSIS — I10 Essential (primary) hypertension: Secondary | ICD-10-CM | POA: Diagnosis not present

## 2021-11-30 DIAGNOSIS — R627 Adult failure to thrive: Secondary | ICD-10-CM | POA: Diagnosis not present

## 2021-11-30 DIAGNOSIS — I700 Atherosclerosis of aorta: Secondary | ICD-10-CM | POA: Diagnosis not present

## 2021-11-30 DIAGNOSIS — I69398 Other sequelae of cerebral infarction: Secondary | ICD-10-CM | POA: Diagnosis not present

## 2021-11-30 DIAGNOSIS — R262 Difficulty in walking, not elsewhere classified: Secondary | ICD-10-CM | POA: Diagnosis not present

## 2021-11-30 DIAGNOSIS — F03918 Unspecified dementia, unspecified severity, with other behavioral disturbance: Secondary | ICD-10-CM | POA: Diagnosis not present

## 2021-11-30 DIAGNOSIS — R1311 Dysphagia, oral phase: Secondary | ICD-10-CM | POA: Diagnosis not present

## 2021-11-30 DIAGNOSIS — F3170 Bipolar disorder, currently in remission, most recent episode unspecified: Secondary | ICD-10-CM | POA: Diagnosis not present

## 2021-11-30 DIAGNOSIS — M6281 Muscle weakness (generalized): Secondary | ICD-10-CM | POA: Diagnosis not present

## 2021-12-01 DIAGNOSIS — F0390 Unspecified dementia without behavioral disturbance: Secondary | ICD-10-CM | POA: Diagnosis not present

## 2021-12-01 DIAGNOSIS — F339 Major depressive disorder, recurrent, unspecified: Secondary | ICD-10-CM | POA: Diagnosis not present

## 2021-12-01 DIAGNOSIS — G309 Alzheimer's disease, unspecified: Secondary | ICD-10-CM | POA: Diagnosis not present

## 2021-12-01 DIAGNOSIS — F411 Generalized anxiety disorder: Secondary | ICD-10-CM | POA: Diagnosis not present

## 2021-12-02 DIAGNOSIS — I10 Essential (primary) hypertension: Secondary | ICD-10-CM | POA: Diagnosis not present

## 2021-12-02 DIAGNOSIS — R627 Adult failure to thrive: Secondary | ICD-10-CM | POA: Diagnosis not present

## 2021-12-02 DIAGNOSIS — M6281 Muscle weakness (generalized): Secondary | ICD-10-CM | POA: Diagnosis not present

## 2021-12-02 DIAGNOSIS — F03918 Unspecified dementia, unspecified severity, with other behavioral disturbance: Secondary | ICD-10-CM | POA: Diagnosis not present

## 2021-12-02 DIAGNOSIS — F3170 Bipolar disorder, currently in remission, most recent episode unspecified: Secondary | ICD-10-CM | POA: Diagnosis not present

## 2021-12-02 DIAGNOSIS — R1311 Dysphagia, oral phase: Secondary | ICD-10-CM | POA: Diagnosis not present

## 2021-12-02 DIAGNOSIS — R262 Difficulty in walking, not elsewhere classified: Secondary | ICD-10-CM | POA: Diagnosis not present

## 2021-12-02 DIAGNOSIS — I69398 Other sequelae of cerebral infarction: Secondary | ICD-10-CM | POA: Diagnosis not present

## 2021-12-02 DIAGNOSIS — I700 Atherosclerosis of aorta: Secondary | ICD-10-CM | POA: Diagnosis not present

## 2021-12-03 DIAGNOSIS — R262 Difficulty in walking, not elsewhere classified: Secondary | ICD-10-CM | POA: Diagnosis not present

## 2021-12-03 DIAGNOSIS — M6281 Muscle weakness (generalized): Secondary | ICD-10-CM | POA: Diagnosis not present

## 2021-12-03 DIAGNOSIS — R1311 Dysphagia, oral phase: Secondary | ICD-10-CM | POA: Diagnosis not present

## 2021-12-03 DIAGNOSIS — I700 Atherosclerosis of aorta: Secondary | ICD-10-CM | POA: Diagnosis not present

## 2021-12-03 DIAGNOSIS — F3170 Bipolar disorder, currently in remission, most recent episode unspecified: Secondary | ICD-10-CM | POA: Diagnosis not present

## 2021-12-03 DIAGNOSIS — R627 Adult failure to thrive: Secondary | ICD-10-CM | POA: Diagnosis not present

## 2021-12-03 DIAGNOSIS — I69398 Other sequelae of cerebral infarction: Secondary | ICD-10-CM | POA: Diagnosis not present

## 2021-12-03 DIAGNOSIS — I10 Essential (primary) hypertension: Secondary | ICD-10-CM | POA: Diagnosis not present

## 2021-12-03 DIAGNOSIS — F03918 Unspecified dementia, unspecified severity, with other behavioral disturbance: Secondary | ICD-10-CM | POA: Diagnosis not present

## 2021-12-05 DIAGNOSIS — I700 Atherosclerosis of aorta: Secondary | ICD-10-CM | POA: Diagnosis not present

## 2021-12-05 DIAGNOSIS — R262 Difficulty in walking, not elsewhere classified: Secondary | ICD-10-CM | POA: Diagnosis not present

## 2021-12-05 DIAGNOSIS — I10 Essential (primary) hypertension: Secondary | ICD-10-CM | POA: Diagnosis not present

## 2021-12-05 DIAGNOSIS — M6281 Muscle weakness (generalized): Secondary | ICD-10-CM | POA: Diagnosis not present

## 2021-12-05 DIAGNOSIS — F03918 Unspecified dementia, unspecified severity, with other behavioral disturbance: Secondary | ICD-10-CM | POA: Diagnosis not present

## 2021-12-05 DIAGNOSIS — F3170 Bipolar disorder, currently in remission, most recent episode unspecified: Secondary | ICD-10-CM | POA: Diagnosis not present

## 2021-12-05 DIAGNOSIS — R1311 Dysphagia, oral phase: Secondary | ICD-10-CM | POA: Diagnosis not present

## 2021-12-05 DIAGNOSIS — R627 Adult failure to thrive: Secondary | ICD-10-CM | POA: Diagnosis not present

## 2021-12-05 DIAGNOSIS — I69398 Other sequelae of cerebral infarction: Secondary | ICD-10-CM | POA: Diagnosis not present

## 2021-12-06 DIAGNOSIS — I700 Atherosclerosis of aorta: Secondary | ICD-10-CM | POA: Diagnosis not present

## 2021-12-06 DIAGNOSIS — R627 Adult failure to thrive: Secondary | ICD-10-CM | POA: Diagnosis not present

## 2021-12-06 DIAGNOSIS — I10 Essential (primary) hypertension: Secondary | ICD-10-CM | POA: Diagnosis not present

## 2021-12-06 DIAGNOSIS — F3170 Bipolar disorder, currently in remission, most recent episode unspecified: Secondary | ICD-10-CM | POA: Diagnosis not present

## 2021-12-06 DIAGNOSIS — R1311 Dysphagia, oral phase: Secondary | ICD-10-CM | POA: Diagnosis not present

## 2021-12-06 DIAGNOSIS — R262 Difficulty in walking, not elsewhere classified: Secondary | ICD-10-CM | POA: Diagnosis not present

## 2021-12-06 DIAGNOSIS — I69398 Other sequelae of cerebral infarction: Secondary | ICD-10-CM | POA: Diagnosis not present

## 2021-12-06 DIAGNOSIS — F03918 Unspecified dementia, unspecified severity, with other behavioral disturbance: Secondary | ICD-10-CM | POA: Diagnosis not present

## 2021-12-06 DIAGNOSIS — M6281 Muscle weakness (generalized): Secondary | ICD-10-CM | POA: Diagnosis not present

## 2021-12-07 DIAGNOSIS — G309 Alzheimer's disease, unspecified: Secondary | ICD-10-CM | POA: Diagnosis not present

## 2021-12-07 DIAGNOSIS — R7303 Prediabetes: Secondary | ICD-10-CM | POA: Diagnosis not present

## 2021-12-07 DIAGNOSIS — F03918 Unspecified dementia, unspecified severity, with other behavioral disturbance: Secondary | ICD-10-CM | POA: Diagnosis not present

## 2021-12-07 DIAGNOSIS — F339 Major depressive disorder, recurrent, unspecified: Secondary | ICD-10-CM | POA: Diagnosis not present

## 2021-12-07 DIAGNOSIS — Z86011 Personal history of benign neoplasm of the brain: Secondary | ICD-10-CM | POA: Diagnosis not present

## 2021-12-07 DIAGNOSIS — U071 COVID-19: Secondary | ICD-10-CM | POA: Diagnosis not present

## 2021-12-07 DIAGNOSIS — F0390 Unspecified dementia without behavioral disturbance: Secondary | ICD-10-CM | POA: Diagnosis not present

## 2021-12-07 DIAGNOSIS — F319 Bipolar disorder, unspecified: Secondary | ICD-10-CM | POA: Diagnosis not present

## 2021-12-07 DIAGNOSIS — J449 Chronic obstructive pulmonary disease, unspecified: Secondary | ICD-10-CM | POA: Diagnosis not present

## 2021-12-07 DIAGNOSIS — R1311 Dysphagia, oral phase: Secondary | ICD-10-CM | POA: Diagnosis not present

## 2021-12-07 DIAGNOSIS — E440 Moderate protein-calorie malnutrition: Secondary | ICD-10-CM | POA: Diagnosis not present

## 2021-12-07 DIAGNOSIS — R41841 Cognitive communication deficit: Secondary | ICD-10-CM | POA: Diagnosis not present

## 2021-12-21 DIAGNOSIS — F319 Bipolar disorder, unspecified: Secondary | ICD-10-CM | POA: Diagnosis not present

## 2021-12-21 DIAGNOSIS — G309 Alzheimer's disease, unspecified: Secondary | ICD-10-CM | POA: Diagnosis not present

## 2021-12-21 DIAGNOSIS — F339 Major depressive disorder, recurrent, unspecified: Secondary | ICD-10-CM | POA: Diagnosis not present

## 2021-12-21 DIAGNOSIS — F0390 Unspecified dementia without behavioral disturbance: Secondary | ICD-10-CM | POA: Diagnosis not present

## 2021-12-27 ENCOUNTER — Other Ambulatory Visit: Payer: Self-pay

## 2021-12-27 NOTE — Patient Outreach (Signed)
[place] [facility]) Care Management  12/27/2021  Sandra Robinson 1951-12-17 [id number]     Transition of Care Referral  Referral Date: 12/27/2021 Referral Source: Discharge Report Date of Discharge: 12/24/2021 Facility: [facility] attempt #1 to patient. No answer. RN CM left HIPAA compliant voicemail message along with contact info.    Plan: RN CM will make outreach attempt to patient within 3-4 business days. RN CM will send unsuccessful outreach letter to patient.   Enzo Montgomery, RN,BSN,CCM [place] Management Telephonic Care Management Coordinator Direct Phone: [phone] Toll Free: [phone] Fax: [phone]

## 2021-12-27 NOTE — Patient Outreach (Signed)
[facility]) Care Management  12/27/2021  Sandra Robinson 1951-05-14 [id number]  [employer] member referral for SNF discharge. Patient assigned to Enzo Montgomery, RN for transition of care follow up.  [name] Management Assistant [phone]

## 2021-12-28 ENCOUNTER — Other Ambulatory Visit: Payer: Self-pay

## 2021-12-28 NOTE — Patient Outreach (Signed)
[place] [facility]) Care Management  12/28/2021  ARAYNA ILLESCAS 1951-07-14 [id number]   Transition of Care Referral   Referral Date: 12/27/2021 Referral Source: Discharge Report Date of Discharge: 12/24/2021 Facility: [place]    Unsuccessful outreach attempt # 2 to patient.      Plan: RN CM will make outreach attempt to patient within 3-4 business days.  Enzo Montgomery, RN,BSN,CCM [place] Management Telephonic Care Management Coordinator Direct Phone: [phone] Toll Free: [phone] Fax: [phone]

## 2021-12-29 ENCOUNTER — Other Ambulatory Visit: Payer: Self-pay

## 2021-12-29 NOTE — Patient Outreach (Signed)
[facility]) Care Management  12/29/2021  Sandra Robinson [birth year] [id number]   Transition of Care Referral   Referral Date: 12/27/2021 Referral Source: Discharge Report Date of Discharge: 12/24/2021 Facility: [place]   Unsuccessful outreach attempt to patient.    Plan:  RN CM will make outreach attempt to patient within 3-4 wks if no response from letter mailed to patient.  Enzo Montgomery, RN,BSN,CCM [place] Management Telephonic Care Management Coordinator Direct Phone: [phone] Toll Free: [phone] Fax: [phone]

## 2021-12-30 DIAGNOSIS — I10 Essential (primary) hypertension: Secondary | ICD-10-CM | POA: Diagnosis not present

## 2021-12-30 DIAGNOSIS — R7303 Prediabetes: Secondary | ICD-10-CM | POA: Diagnosis not present

## 2021-12-30 DIAGNOSIS — F339 Major depressive disorder, recurrent, unspecified: Secondary | ICD-10-CM | POA: Diagnosis not present

## 2021-12-30 DIAGNOSIS — R451 Restlessness and agitation: Secondary | ICD-10-CM | POA: Diagnosis not present

## 2021-12-30 DIAGNOSIS — F319 Bipolar disorder, unspecified: Secondary | ICD-10-CM | POA: Diagnosis not present

## 2021-12-30 DIAGNOSIS — F03918 Unspecified dementia, unspecified severity, with other behavioral disturbance: Secondary | ICD-10-CM | POA: Diagnosis not present

## 2021-12-30 DIAGNOSIS — R4182 Altered mental status, unspecified: Secondary | ICD-10-CM | POA: Diagnosis not present

## 2021-12-30 DIAGNOSIS — R627 Adult failure to thrive: Secondary | ICD-10-CM | POA: Diagnosis not present

## 2021-12-30 DIAGNOSIS — J449 Chronic obstructive pulmonary disease, unspecified: Secondary | ICD-10-CM | POA: Diagnosis not present

## 2022-01-02 DIAGNOSIS — D649 Anemia, unspecified: Secondary | ICD-10-CM | POA: Diagnosis not present

## 2022-01-02 DIAGNOSIS — I10 Essential (primary) hypertension: Secondary | ICD-10-CM | POA: Diagnosis not present

## 2022-01-05 DIAGNOSIS — R627 Adult failure to thrive: Secondary | ICD-10-CM | POA: Diagnosis not present

## 2022-01-05 DIAGNOSIS — F339 Major depressive disorder, recurrent, unspecified: Secondary | ICD-10-CM | POA: Diagnosis not present

## 2022-01-05 DIAGNOSIS — E782 Mixed hyperlipidemia: Secondary | ICD-10-CM | POA: Diagnosis not present

## 2022-01-05 DIAGNOSIS — F319 Bipolar disorder, unspecified: Secondary | ICD-10-CM | POA: Diagnosis not present

## 2022-01-05 DIAGNOSIS — M6281 Muscle weakness (generalized): Secondary | ICD-10-CM | POA: Diagnosis not present

## 2022-01-05 DIAGNOSIS — I10 Essential (primary) hypertension: Secondary | ICD-10-CM | POA: Diagnosis not present

## 2022-01-05 DIAGNOSIS — R7303 Prediabetes: Secondary | ICD-10-CM | POA: Diagnosis not present

## 2022-01-05 DIAGNOSIS — F03918 Unspecified dementia, unspecified severity, with other behavioral disturbance: Secondary | ICD-10-CM | POA: Diagnosis not present

## 2022-01-05 DIAGNOSIS — J449 Chronic obstructive pulmonary disease, unspecified: Secondary | ICD-10-CM | POA: Diagnosis not present

## 2022-01-09 DIAGNOSIS — Z79899 Other long term (current) drug therapy: Secondary | ICD-10-CM | POA: Diagnosis not present

## 2022-01-12 ENCOUNTER — Inpatient Hospital Stay: Payer: MEDICAID | Admitting: Neurology

## 2022-01-12 ENCOUNTER — Encounter: Payer: Self-pay | Admitting: Neurology

## 2022-01-13 DIAGNOSIS — Z79899 Other long term (current) drug therapy: Secondary | ICD-10-CM | POA: Diagnosis not present

## 2022-01-17 ENCOUNTER — Other Ambulatory Visit: Payer: Self-pay

## 2022-01-17 NOTE — Patient Outreach (Signed)
[place] [facility]) Care Management  01/17/2022  STACYE NOORI August 04, 1951 [id number]   Transition of Care Referral   Referral Date: 12/27/2021 Referral Source: Discharge Report Date of Discharge: 12/24/2021 Facility: [place]    Multiple attempts to establish contact with patient without success. No response from letter mailed to patient. Case is being closed at this time.    Plan: RN CM will close case.  Enzo Montgomery, RN,BSN,CCM [place] Management Telephonic Care Management Coordinator Direct Phone: [phone] Toll Free: [phone] Fax: [phone]

## 2022-01-18 DIAGNOSIS — I7389 Other specified peripheral vascular diseases: Secondary | ICD-10-CM | POA: Diagnosis not present

## 2022-01-18 DIAGNOSIS — M79675 Pain in left toe(s): Secondary | ICD-10-CM | POA: Diagnosis not present

## 2022-01-18 DIAGNOSIS — L03031 Cellulitis of right toe: Secondary | ICD-10-CM | POA: Diagnosis not present

## 2022-01-18 DIAGNOSIS — M79674 Pain in right toe(s): Secondary | ICD-10-CM | POA: Diagnosis not present

## 2022-01-18 DIAGNOSIS — B351 Tinea unguium: Secondary | ICD-10-CM | POA: Diagnosis not present

## 2022-01-19 ENCOUNTER — Ambulatory Visit: Payer: Self-pay

## 2022-01-23 DIAGNOSIS — Z79899 Other long term (current) drug therapy: Secondary | ICD-10-CM | POA: Diagnosis not present

## 2022-01-30 DIAGNOSIS — F339 Major depressive disorder, recurrent, unspecified: Secondary | ICD-10-CM | POA: Diagnosis not present

## 2022-01-30 DIAGNOSIS — R7303 Prediabetes: Secondary | ICD-10-CM | POA: Diagnosis not present

## 2022-01-30 DIAGNOSIS — E782 Mixed hyperlipidemia: Secondary | ICD-10-CM | POA: Diagnosis not present

## 2022-01-30 DIAGNOSIS — F03918 Unspecified dementia, unspecified severity, with other behavioral disturbance: Secondary | ICD-10-CM | POA: Diagnosis not present

## 2022-01-30 DIAGNOSIS — M7989 Other specified soft tissue disorders: Secondary | ICD-10-CM | POA: Diagnosis not present

## 2022-01-30 DIAGNOSIS — J449 Chronic obstructive pulmonary disease, unspecified: Secondary | ICD-10-CM | POA: Diagnosis not present

## 2022-01-30 DIAGNOSIS — I10 Essential (primary) hypertension: Secondary | ICD-10-CM | POA: Diagnosis not present

## 2022-01-30 DIAGNOSIS — F319 Bipolar disorder, unspecified: Secondary | ICD-10-CM | POA: Diagnosis not present

## 2022-01-30 DIAGNOSIS — R627 Adult failure to thrive: Secondary | ICD-10-CM | POA: Diagnosis not present

## 2022-01-30 DIAGNOSIS — Z79899 Other long term (current) drug therapy: Secondary | ICD-10-CM | POA: Diagnosis not present

## 2022-01-30 DIAGNOSIS — M79605 Pain in left leg: Secondary | ICD-10-CM | POA: Diagnosis not present

## 2022-01-30 DIAGNOSIS — M6281 Muscle weakness (generalized): Secondary | ICD-10-CM | POA: Diagnosis not present

## 2022-02-03 DIAGNOSIS — M25511 Pain in right shoulder: Secondary | ICD-10-CM | POA: Diagnosis not present

## 2022-02-03 DIAGNOSIS — E782 Mixed hyperlipidemia: Secondary | ICD-10-CM | POA: Diagnosis not present

## 2022-02-03 DIAGNOSIS — I10 Essential (primary) hypertension: Secondary | ICD-10-CM | POA: Diagnosis not present

## 2022-02-03 DIAGNOSIS — F339 Major depressive disorder, recurrent, unspecified: Secondary | ICD-10-CM | POA: Diagnosis not present

## 2022-02-03 DIAGNOSIS — M19012 Primary osteoarthritis, left shoulder: Secondary | ICD-10-CM | POA: Diagnosis not present

## 2022-02-03 DIAGNOSIS — M6281 Muscle weakness (generalized): Secondary | ICD-10-CM | POA: Diagnosis not present

## 2022-02-03 DIAGNOSIS — S42002A Fracture of unspecified part of left clavicle, initial encounter for closed fracture: Secondary | ICD-10-CM | POA: Diagnosis not present

## 2022-02-03 DIAGNOSIS — J449 Chronic obstructive pulmonary disease, unspecified: Secondary | ICD-10-CM | POA: Diagnosis not present

## 2022-02-03 DIAGNOSIS — M25512 Pain in left shoulder: Secondary | ICD-10-CM | POA: Diagnosis not present

## 2022-02-03 DIAGNOSIS — R7303 Prediabetes: Secondary | ICD-10-CM | POA: Diagnosis not present

## 2022-02-03 DIAGNOSIS — F319 Bipolar disorder, unspecified: Secondary | ICD-10-CM | POA: Diagnosis not present

## 2022-02-03 DIAGNOSIS — M19011 Primary osteoarthritis, right shoulder: Secondary | ICD-10-CM | POA: Diagnosis not present

## 2022-02-03 DIAGNOSIS — R627 Adult failure to thrive: Secondary | ICD-10-CM | POA: Diagnosis not present

## 2022-02-07 DIAGNOSIS — R531 Weakness: Secondary | ICD-10-CM | POA: Diagnosis not present

## 2022-02-07 DIAGNOSIS — M6281 Muscle weakness (generalized): Secondary | ICD-10-CM | POA: Diagnosis not present

## 2022-02-07 DIAGNOSIS — F339 Major depressive disorder, recurrent, unspecified: Secondary | ICD-10-CM | POA: Diagnosis not present

## 2022-02-07 DIAGNOSIS — R279 Unspecified lack of coordination: Secondary | ICD-10-CM | POA: Diagnosis not present

## 2022-02-07 DIAGNOSIS — Z741 Need for assistance with personal care: Secondary | ICD-10-CM | POA: Diagnosis not present

## 2022-02-07 DIAGNOSIS — R7303 Prediabetes: Secondary | ICD-10-CM | POA: Diagnosis not present

## 2022-02-07 DIAGNOSIS — F03918 Unspecified dementia, unspecified severity, with other behavioral disturbance: Secondary | ICD-10-CM | POA: Diagnosis not present

## 2022-02-07 DIAGNOSIS — I69811 Memory deficit following other cerebrovascular disease: Secondary | ICD-10-CM | POA: Diagnosis not present

## 2022-02-07 DIAGNOSIS — K219 Gastro-esophageal reflux disease without esophagitis: Secondary | ICD-10-CM | POA: Diagnosis not present

## 2022-02-08 DIAGNOSIS — F319 Bipolar disorder, unspecified: Secondary | ICD-10-CM | POA: Diagnosis not present

## 2022-02-08 DIAGNOSIS — E782 Mixed hyperlipidemia: Secondary | ICD-10-CM | POA: Diagnosis not present

## 2022-02-08 DIAGNOSIS — Z741 Need for assistance with personal care: Secondary | ICD-10-CM | POA: Diagnosis not present

## 2022-02-08 DIAGNOSIS — J449 Chronic obstructive pulmonary disease, unspecified: Secondary | ICD-10-CM | POA: Diagnosis not present

## 2022-02-08 DIAGNOSIS — R531 Weakness: Secondary | ICD-10-CM | POA: Diagnosis not present

## 2022-02-08 DIAGNOSIS — I10 Essential (primary) hypertension: Secondary | ICD-10-CM | POA: Diagnosis not present

## 2022-02-08 DIAGNOSIS — I69811 Memory deficit following other cerebrovascular disease: Secondary | ICD-10-CM | POA: Diagnosis not present

## 2022-02-08 DIAGNOSIS — R279 Unspecified lack of coordination: Secondary | ICD-10-CM | POA: Diagnosis not present

## 2022-02-08 DIAGNOSIS — M6281 Muscle weakness (generalized): Secondary | ICD-10-CM | POA: Diagnosis not present

## 2022-02-08 DIAGNOSIS — K219 Gastro-esophageal reflux disease without esophagitis: Secondary | ICD-10-CM | POA: Diagnosis not present

## 2022-02-08 DIAGNOSIS — F03918 Unspecified dementia, unspecified severity, with other behavioral disturbance: Secondary | ICD-10-CM | POA: Diagnosis not present

## 2022-02-08 DIAGNOSIS — S42009A Fracture of unspecified part of unspecified clavicle, initial encounter for closed fracture: Secondary | ICD-10-CM | POA: Diagnosis not present

## 2022-02-08 DIAGNOSIS — F339 Major depressive disorder, recurrent, unspecified: Secondary | ICD-10-CM | POA: Diagnosis not present

## 2022-02-08 DIAGNOSIS — R7303 Prediabetes: Secondary | ICD-10-CM | POA: Diagnosis not present

## 2022-02-09 DIAGNOSIS — F339 Major depressive disorder, recurrent, unspecified: Secondary | ICD-10-CM | POA: Diagnosis not present

## 2022-02-09 DIAGNOSIS — K219 Gastro-esophageal reflux disease without esophagitis: Secondary | ICD-10-CM | POA: Diagnosis not present

## 2022-02-09 DIAGNOSIS — R279 Unspecified lack of coordination: Secondary | ICD-10-CM | POA: Diagnosis not present

## 2022-02-09 DIAGNOSIS — I69811 Memory deficit following other cerebrovascular disease: Secondary | ICD-10-CM | POA: Diagnosis not present

## 2022-02-09 DIAGNOSIS — F03918 Unspecified dementia, unspecified severity, with other behavioral disturbance: Secondary | ICD-10-CM | POA: Diagnosis not present

## 2022-02-09 DIAGNOSIS — Z741 Need for assistance with personal care: Secondary | ICD-10-CM | POA: Diagnosis not present

## 2022-02-09 DIAGNOSIS — R7303 Prediabetes: Secondary | ICD-10-CM | POA: Diagnosis not present

## 2022-02-09 DIAGNOSIS — M6281 Muscle weakness (generalized): Secondary | ICD-10-CM | POA: Diagnosis not present

## 2022-02-09 DIAGNOSIS — R531 Weakness: Secondary | ICD-10-CM | POA: Diagnosis not present

## 2022-02-13 DIAGNOSIS — K219 Gastro-esophageal reflux disease without esophagitis: Secondary | ICD-10-CM | POA: Diagnosis not present

## 2022-02-13 DIAGNOSIS — I517 Cardiomegaly: Secondary | ICD-10-CM | POA: Diagnosis not present

## 2022-02-13 DIAGNOSIS — Z741 Need for assistance with personal care: Secondary | ICD-10-CM | POA: Diagnosis not present

## 2022-02-13 DIAGNOSIS — E782 Mixed hyperlipidemia: Secondary | ICD-10-CM | POA: Diagnosis not present

## 2022-02-13 DIAGNOSIS — S42002A Fracture of unspecified part of left clavicle, initial encounter for closed fracture: Secondary | ICD-10-CM | POA: Diagnosis not present

## 2022-02-13 DIAGNOSIS — Z79899 Other long term (current) drug therapy: Secondary | ICD-10-CM | POA: Diagnosis not present

## 2022-02-13 DIAGNOSIS — J449 Chronic obstructive pulmonary disease, unspecified: Secondary | ICD-10-CM | POA: Diagnosis not present

## 2022-02-13 DIAGNOSIS — F339 Major depressive disorder, recurrent, unspecified: Secondary | ICD-10-CM | POA: Diagnosis not present

## 2022-02-13 DIAGNOSIS — R279 Unspecified lack of coordination: Secondary | ICD-10-CM | POA: Diagnosis not present

## 2022-02-13 DIAGNOSIS — F319 Bipolar disorder, unspecified: Secondary | ICD-10-CM | POA: Diagnosis not present

## 2022-02-13 DIAGNOSIS — J90 Pleural effusion, not elsewhere classified: Secondary | ICD-10-CM | POA: Diagnosis not present

## 2022-02-13 DIAGNOSIS — M6281 Muscle weakness (generalized): Secondary | ICD-10-CM | POA: Diagnosis not present

## 2022-02-13 DIAGNOSIS — I10 Essential (primary) hypertension: Secondary | ICD-10-CM | POA: Diagnosis not present

## 2022-02-13 DIAGNOSIS — R7303 Prediabetes: Secondary | ICD-10-CM | POA: Diagnosis not present

## 2022-02-13 DIAGNOSIS — I69811 Memory deficit following other cerebrovascular disease: Secondary | ICD-10-CM | POA: Diagnosis not present

## 2022-02-13 DIAGNOSIS — R531 Weakness: Secondary | ICD-10-CM | POA: Diagnosis not present

## 2022-02-13 DIAGNOSIS — D649 Anemia, unspecified: Secondary | ICD-10-CM | POA: Diagnosis not present

## 2022-02-13 DIAGNOSIS — F03918 Unspecified dementia, unspecified severity, with other behavioral disturbance: Secondary | ICD-10-CM | POA: Diagnosis not present

## 2022-02-13 DIAGNOSIS — R627 Adult failure to thrive: Secondary | ICD-10-CM | POA: Diagnosis not present

## 2022-02-14 DIAGNOSIS — F0390 Unspecified dementia without behavioral disturbance: Secondary | ICD-10-CM | POA: Diagnosis not present

## 2022-02-14 DIAGNOSIS — I2510 Atherosclerotic heart disease of native coronary artery without angina pectoris: Secondary | ICD-10-CM | POA: Diagnosis not present

## 2022-02-14 DIAGNOSIS — R531 Weakness: Secondary | ICD-10-CM | POA: Diagnosis not present

## 2022-02-14 DIAGNOSIS — J441 Chronic obstructive pulmonary disease with (acute) exacerbation: Secondary | ICD-10-CM | POA: Diagnosis not present

## 2022-02-14 DIAGNOSIS — K219 Gastro-esophageal reflux disease without esophagitis: Secondary | ICD-10-CM | POA: Diagnosis not present

## 2022-02-14 DIAGNOSIS — Z66 Do not resuscitate: Secondary | ICD-10-CM | POA: Diagnosis not present

## 2022-02-14 DIAGNOSIS — R7303 Prediabetes: Secondary | ICD-10-CM | POA: Diagnosis not present

## 2022-02-14 DIAGNOSIS — F3170 Bipolar disorder, currently in remission, most recent episode unspecified: Secondary | ICD-10-CM | POA: Diagnosis not present

## 2022-02-14 DIAGNOSIS — J80 Acute respiratory distress syndrome: Secondary | ICD-10-CM | POA: Diagnosis not present

## 2022-02-14 DIAGNOSIS — J9601 Acute respiratory failure with hypoxia: Secondary | ICD-10-CM | POA: Diagnosis not present

## 2022-02-14 DIAGNOSIS — F319 Bipolar disorder, unspecified: Secondary | ICD-10-CM | POA: Diagnosis not present

## 2022-02-14 DIAGNOSIS — Z79899 Other long term (current) drug therapy: Secondary | ICD-10-CM | POA: Diagnosis not present

## 2022-02-14 DIAGNOSIS — I081 Rheumatic disorders of both mitral and tricuspid valves: Secondary | ICD-10-CM | POA: Diagnosis not present

## 2022-02-14 DIAGNOSIS — E440 Moderate protein-calorie malnutrition: Secondary | ICD-10-CM | POA: Diagnosis not present

## 2022-02-14 DIAGNOSIS — R0600 Dyspnea, unspecified: Secondary | ICD-10-CM | POA: Diagnosis not present

## 2022-02-14 DIAGNOSIS — E8770 Fluid overload, unspecified: Secondary | ICD-10-CM | POA: Diagnosis not present

## 2022-02-14 DIAGNOSIS — F339 Major depressive disorder, recurrent, unspecified: Secondary | ICD-10-CM | POA: Diagnosis not present

## 2022-02-14 DIAGNOSIS — R0602 Shortness of breath: Secondary | ICD-10-CM | POA: Diagnosis not present

## 2022-02-14 DIAGNOSIS — Z741 Need for assistance with personal care: Secondary | ICD-10-CM | POA: Diagnosis not present

## 2022-02-14 DIAGNOSIS — J189 Pneumonia, unspecified organism: Secondary | ICD-10-CM | POA: Diagnosis not present

## 2022-02-14 DIAGNOSIS — Z7982 Long term (current) use of aspirin: Secondary | ICD-10-CM | POA: Diagnosis not present

## 2022-02-14 DIAGNOSIS — I5023 Acute on chronic systolic (congestive) heart failure: Secondary | ICD-10-CM | POA: Diagnosis not present

## 2022-02-14 DIAGNOSIS — I10 Essential (primary) hypertension: Secondary | ICD-10-CM | POA: Diagnosis not present

## 2022-02-14 DIAGNOSIS — R627 Adult failure to thrive: Secondary | ICD-10-CM | POA: Diagnosis not present

## 2022-02-14 DIAGNOSIS — J811 Chronic pulmonary edema: Secondary | ICD-10-CM | POA: Diagnosis not present

## 2022-02-14 DIAGNOSIS — R4182 Altered mental status, unspecified: Secondary | ICD-10-CM | POA: Diagnosis not present

## 2022-02-14 DIAGNOSIS — F03918 Unspecified dementia, unspecified severity, with other behavioral disturbance: Secondary | ICD-10-CM | POA: Diagnosis not present

## 2022-02-14 DIAGNOSIS — R000 Tachycardia, unspecified: Secondary | ICD-10-CM | POA: Diagnosis not present

## 2022-02-14 DIAGNOSIS — D329 Benign neoplasm of meninges, unspecified: Secondary | ICD-10-CM | POA: Diagnosis not present

## 2022-02-14 DIAGNOSIS — R279 Unspecified lack of coordination: Secondary | ICD-10-CM | POA: Diagnosis not present

## 2022-02-14 DIAGNOSIS — E785 Hyperlipidemia, unspecified: Secondary | ICD-10-CM | POA: Diagnosis not present

## 2022-02-14 DIAGNOSIS — I110 Hypertensive heart disease with heart failure: Secondary | ICD-10-CM | POA: Diagnosis not present

## 2022-02-14 DIAGNOSIS — F03911 Unspecified dementia, unspecified severity, with agitation: Secondary | ICD-10-CM | POA: Diagnosis not present

## 2022-02-14 DIAGNOSIS — S42002A Fracture of unspecified part of left clavicle, initial encounter for closed fracture: Secondary | ICD-10-CM | POA: Diagnosis not present

## 2022-02-14 DIAGNOSIS — E119 Type 2 diabetes mellitus without complications: Secondary | ICD-10-CM | POA: Diagnosis not present

## 2022-02-14 DIAGNOSIS — I69811 Memory deficit following other cerebrovascular disease: Secondary | ICD-10-CM | POA: Diagnosis not present

## 2022-02-14 DIAGNOSIS — I272 Pulmonary hypertension, unspecified: Secondary | ICD-10-CM | POA: Diagnosis not present

## 2022-02-14 DIAGNOSIS — M6281 Muscle weakness (generalized): Secondary | ICD-10-CM | POA: Diagnosis not present

## 2022-02-14 DIAGNOSIS — J449 Chronic obstructive pulmonary disease, unspecified: Secondary | ICD-10-CM | POA: Diagnosis not present

## 2022-02-14 DIAGNOSIS — I509 Heart failure, unspecified: Secondary | ICD-10-CM | POA: Diagnosis not present

## 2022-02-14 DIAGNOSIS — F17200 Nicotine dependence, unspecified, uncomplicated: Secondary | ICD-10-CM | POA: Diagnosis not present

## 2022-02-15 DIAGNOSIS — M6281 Muscle weakness (generalized): Secondary | ICD-10-CM | POA: Diagnosis not present

## 2022-02-15 DIAGNOSIS — I110 Hypertensive heart disease with heart failure: Secondary | ICD-10-CM | POA: Diagnosis not present

## 2022-02-15 DIAGNOSIS — R279 Unspecified lack of coordination: Secondary | ICD-10-CM | POA: Diagnosis not present

## 2022-02-15 DIAGNOSIS — J9601 Acute respiratory failure with hypoxia: Secondary | ICD-10-CM | POA: Diagnosis not present

## 2022-02-15 DIAGNOSIS — F319 Bipolar disorder, unspecified: Secondary | ICD-10-CM | POA: Diagnosis not present

## 2022-02-15 DIAGNOSIS — R531 Weakness: Secondary | ICD-10-CM | POA: Diagnosis not present

## 2022-02-15 DIAGNOSIS — R0602 Shortness of breath: Secondary | ICD-10-CM | POA: Diagnosis not present

## 2022-02-15 DIAGNOSIS — I2510 Atherosclerotic heart disease of native coronary artery without angina pectoris: Secondary | ICD-10-CM | POA: Diagnosis not present

## 2022-02-15 DIAGNOSIS — I081 Rheumatic disorders of both mitral and tricuspid valves: Secondary | ICD-10-CM | POA: Diagnosis not present

## 2022-02-15 DIAGNOSIS — F0390 Unspecified dementia without behavioral disturbance: Secondary | ICD-10-CM | POA: Diagnosis not present

## 2022-02-15 DIAGNOSIS — I69811 Memory deficit following other cerebrovascular disease: Secondary | ICD-10-CM | POA: Diagnosis not present

## 2022-02-15 DIAGNOSIS — I509 Heart failure, unspecified: Secondary | ICD-10-CM | POA: Diagnosis not present

## 2022-02-15 DIAGNOSIS — S42002A Fracture of unspecified part of left clavicle, initial encounter for closed fracture: Secondary | ICD-10-CM | POA: Diagnosis not present

## 2022-02-15 DIAGNOSIS — R627 Adult failure to thrive: Secondary | ICD-10-CM | POA: Diagnosis not present

## 2022-02-15 DIAGNOSIS — Z66 Do not resuscitate: Secondary | ICD-10-CM | POA: Diagnosis not present

## 2022-02-15 DIAGNOSIS — F03911 Unspecified dementia, unspecified severity, with agitation: Secondary | ICD-10-CM | POA: Diagnosis not present

## 2022-02-15 DIAGNOSIS — J80 Acute respiratory distress syndrome: Secondary | ICD-10-CM | POA: Diagnosis not present

## 2022-02-15 DIAGNOSIS — F3170 Bipolar disorder, currently in remission, most recent episode unspecified: Secondary | ICD-10-CM | POA: Diagnosis not present

## 2022-02-15 DIAGNOSIS — R7303 Prediabetes: Secondary | ICD-10-CM | POA: Diagnosis not present

## 2022-02-15 DIAGNOSIS — F339 Major depressive disorder, recurrent, unspecified: Secondary | ICD-10-CM | POA: Diagnosis not present

## 2022-02-15 DIAGNOSIS — K219 Gastro-esophageal reflux disease without esophagitis: Secondary | ICD-10-CM | POA: Diagnosis not present

## 2022-02-15 DIAGNOSIS — R000 Tachycardia, unspecified: Secondary | ICD-10-CM | POA: Diagnosis not present

## 2022-02-15 DIAGNOSIS — E8770 Fluid overload, unspecified: Secondary | ICD-10-CM | POA: Diagnosis not present

## 2022-02-15 DIAGNOSIS — E440 Moderate protein-calorie malnutrition: Secondary | ICD-10-CM | POA: Diagnosis not present

## 2022-02-15 DIAGNOSIS — E119 Type 2 diabetes mellitus without complications: Secondary | ICD-10-CM | POA: Diagnosis not present

## 2022-02-15 DIAGNOSIS — F03918 Unspecified dementia, unspecified severity, with other behavioral disturbance: Secondary | ICD-10-CM | POA: Diagnosis not present

## 2022-02-15 DIAGNOSIS — J811 Chronic pulmonary edema: Secondary | ICD-10-CM | POA: Diagnosis not present

## 2022-02-15 DIAGNOSIS — D329 Benign neoplasm of meninges, unspecified: Secondary | ICD-10-CM | POA: Diagnosis not present

## 2022-02-15 DIAGNOSIS — R0600 Dyspnea, unspecified: Secondary | ICD-10-CM | POA: Diagnosis not present

## 2022-02-15 DIAGNOSIS — I10 Essential (primary) hypertension: Secondary | ICD-10-CM | POA: Diagnosis not present

## 2022-02-15 DIAGNOSIS — E785 Hyperlipidemia, unspecified: Secondary | ICD-10-CM | POA: Diagnosis not present

## 2022-02-15 DIAGNOSIS — Z741 Need for assistance with personal care: Secondary | ICD-10-CM | POA: Diagnosis not present

## 2022-02-15 DIAGNOSIS — I272 Pulmonary hypertension, unspecified: Secondary | ICD-10-CM | POA: Diagnosis not present

## 2022-02-15 DIAGNOSIS — I5023 Acute on chronic systolic (congestive) heart failure: Secondary | ICD-10-CM | POA: Diagnosis not present

## 2022-02-15 DIAGNOSIS — J441 Chronic obstructive pulmonary disease with (acute) exacerbation: Secondary | ICD-10-CM | POA: Diagnosis not present

## 2022-02-15 DIAGNOSIS — J449 Chronic obstructive pulmonary disease, unspecified: Secondary | ICD-10-CM | POA: Diagnosis not present

## 2022-02-16 DIAGNOSIS — I5023 Acute on chronic systolic (congestive) heart failure: Secondary | ICD-10-CM | POA: Diagnosis not present

## 2022-02-16 DIAGNOSIS — J441 Chronic obstructive pulmonary disease with (acute) exacerbation: Secondary | ICD-10-CM | POA: Diagnosis not present

## 2022-02-16 DIAGNOSIS — R0600 Dyspnea, unspecified: Secondary | ICD-10-CM | POA: Diagnosis not present

## 2022-02-16 DIAGNOSIS — E785 Hyperlipidemia, unspecified: Secondary | ICD-10-CM | POA: Diagnosis not present

## 2022-02-16 DIAGNOSIS — F0390 Unspecified dementia without behavioral disturbance: Secondary | ICD-10-CM | POA: Diagnosis not present

## 2022-02-16 DIAGNOSIS — I2510 Atherosclerotic heart disease of native coronary artery without angina pectoris: Secondary | ICD-10-CM | POA: Diagnosis not present

## 2022-02-16 DIAGNOSIS — I509 Heart failure, unspecified: Secondary | ICD-10-CM | POA: Diagnosis not present

## 2022-02-16 DIAGNOSIS — E119 Type 2 diabetes mellitus without complications: Secondary | ICD-10-CM | POA: Diagnosis not present

## 2022-02-16 DIAGNOSIS — J449 Chronic obstructive pulmonary disease, unspecified: Secondary | ICD-10-CM | POA: Diagnosis not present

## 2022-02-16 DIAGNOSIS — D329 Benign neoplasm of meninges, unspecified: Secondary | ICD-10-CM | POA: Diagnosis not present

## 2022-02-17 DIAGNOSIS — J441 Chronic obstructive pulmonary disease with (acute) exacerbation: Secondary | ICD-10-CM | POA: Diagnosis not present

## 2022-02-17 DIAGNOSIS — E119 Type 2 diabetes mellitus without complications: Secondary | ICD-10-CM | POA: Diagnosis not present

## 2022-02-17 DIAGNOSIS — D329 Benign neoplasm of meninges, unspecified: Secondary | ICD-10-CM | POA: Diagnosis not present

## 2022-02-17 DIAGNOSIS — I509 Heart failure, unspecified: Secondary | ICD-10-CM | POA: Diagnosis not present

## 2022-02-17 DIAGNOSIS — I2510 Atherosclerotic heart disease of native coronary artery without angina pectoris: Secondary | ICD-10-CM | POA: Diagnosis not present

## 2022-02-17 DIAGNOSIS — E785 Hyperlipidemia, unspecified: Secondary | ICD-10-CM | POA: Diagnosis not present

## 2022-02-17 DIAGNOSIS — J449 Chronic obstructive pulmonary disease, unspecified: Secondary | ICD-10-CM | POA: Diagnosis not present

## 2022-02-17 DIAGNOSIS — F0390 Unspecified dementia without behavioral disturbance: Secondary | ICD-10-CM | POA: Diagnosis not present

## 2022-02-17 DIAGNOSIS — I5023 Acute on chronic systolic (congestive) heart failure: Secondary | ICD-10-CM | POA: Diagnosis not present

## 2022-02-17 DIAGNOSIS — R0602 Shortness of breath: Secondary | ICD-10-CM | POA: Diagnosis not present

## 2022-02-18 DIAGNOSIS — E785 Hyperlipidemia, unspecified: Secondary | ICD-10-CM | POA: Diagnosis not present

## 2022-02-18 DIAGNOSIS — I5023 Acute on chronic systolic (congestive) heart failure: Secondary | ICD-10-CM | POA: Diagnosis not present

## 2022-02-18 DIAGNOSIS — J449 Chronic obstructive pulmonary disease, unspecified: Secondary | ICD-10-CM | POA: Diagnosis not present

## 2022-02-18 DIAGNOSIS — J441 Chronic obstructive pulmonary disease with (acute) exacerbation: Secondary | ICD-10-CM | POA: Diagnosis not present

## 2022-02-18 DIAGNOSIS — D329 Benign neoplasm of meninges, unspecified: Secondary | ICD-10-CM | POA: Diagnosis not present

## 2022-02-18 DIAGNOSIS — I2510 Atherosclerotic heart disease of native coronary artery without angina pectoris: Secondary | ICD-10-CM | POA: Diagnosis not present

## 2022-02-18 DIAGNOSIS — F0390 Unspecified dementia without behavioral disturbance: Secondary | ICD-10-CM | POA: Diagnosis not present

## 2022-02-18 DIAGNOSIS — I509 Heart failure, unspecified: Secondary | ICD-10-CM | POA: Diagnosis not present

## 2022-02-18 DIAGNOSIS — E119 Type 2 diabetes mellitus without complications: Secondary | ICD-10-CM | POA: Diagnosis not present

## 2022-02-19 DIAGNOSIS — F0390 Unspecified dementia without behavioral disturbance: Secondary | ICD-10-CM | POA: Diagnosis not present

## 2022-02-19 DIAGNOSIS — E785 Hyperlipidemia, unspecified: Secondary | ICD-10-CM | POA: Diagnosis not present

## 2022-02-19 DIAGNOSIS — I2510 Atherosclerotic heart disease of native coronary artery without angina pectoris: Secondary | ICD-10-CM | POA: Diagnosis not present

## 2022-02-19 DIAGNOSIS — J441 Chronic obstructive pulmonary disease with (acute) exacerbation: Secondary | ICD-10-CM | POA: Diagnosis not present

## 2022-02-19 DIAGNOSIS — E119 Type 2 diabetes mellitus without complications: Secondary | ICD-10-CM | POA: Diagnosis not present

## 2022-02-19 DIAGNOSIS — I5023 Acute on chronic systolic (congestive) heart failure: Secondary | ICD-10-CM | POA: Diagnosis not present

## 2022-02-19 DIAGNOSIS — D329 Benign neoplasm of meninges, unspecified: Secondary | ICD-10-CM | POA: Diagnosis not present

## 2022-02-19 DIAGNOSIS — I509 Heart failure, unspecified: Secondary | ICD-10-CM | POA: Diagnosis not present

## 2022-02-19 DIAGNOSIS — J449 Chronic obstructive pulmonary disease, unspecified: Secondary | ICD-10-CM | POA: Diagnosis not present

## 2022-02-20 DIAGNOSIS — F0390 Unspecified dementia without behavioral disturbance: Secondary | ICD-10-CM | POA: Diagnosis not present

## 2022-02-20 DIAGNOSIS — Z7982 Long term (current) use of aspirin: Secondary | ICD-10-CM | POA: Diagnosis not present

## 2022-02-20 DIAGNOSIS — R531 Weakness: Secondary | ICD-10-CM | POA: Diagnosis not present

## 2022-02-20 DIAGNOSIS — R627 Adult failure to thrive: Secondary | ICD-10-CM | POA: Diagnosis not present

## 2022-02-20 DIAGNOSIS — I2510 Atherosclerotic heart disease of native coronary artery without angina pectoris: Secondary | ICD-10-CM | POA: Diagnosis not present

## 2022-02-20 DIAGNOSIS — F319 Bipolar disorder, unspecified: Secondary | ICD-10-CM | POA: Diagnosis not present

## 2022-02-20 DIAGNOSIS — E119 Type 2 diabetes mellitus without complications: Secondary | ICD-10-CM | POA: Diagnosis not present

## 2022-02-20 DIAGNOSIS — R7303 Prediabetes: Secondary | ICD-10-CM | POA: Diagnosis not present

## 2022-02-20 DIAGNOSIS — I5023 Acute on chronic systolic (congestive) heart failure: Secondary | ICD-10-CM | POA: Diagnosis not present

## 2022-02-20 DIAGNOSIS — J441 Chronic obstructive pulmonary disease with (acute) exacerbation: Secondary | ICD-10-CM | POA: Diagnosis not present

## 2022-02-20 DIAGNOSIS — I10 Essential (primary) hypertension: Secondary | ICD-10-CM | POA: Diagnosis not present

## 2022-02-20 DIAGNOSIS — D329 Benign neoplasm of meninges, unspecified: Secondary | ICD-10-CM | POA: Diagnosis not present

## 2022-02-20 DIAGNOSIS — S0000XA Unspecified superficial injury of scalp, initial encounter: Secondary | ICD-10-CM | POA: Diagnosis not present

## 2022-02-20 DIAGNOSIS — S199XXA Unspecified injury of neck, initial encounter: Secondary | ICD-10-CM | POA: Diagnosis not present

## 2022-02-20 DIAGNOSIS — M25512 Pain in left shoulder: Secondary | ICD-10-CM | POA: Diagnosis not present

## 2022-02-20 DIAGNOSIS — Z7901 Long term (current) use of anticoagulants: Secondary | ICD-10-CM | POA: Diagnosis not present

## 2022-02-20 DIAGNOSIS — E440 Moderate protein-calorie malnutrition: Secondary | ICD-10-CM | POA: Diagnosis not present

## 2022-02-20 DIAGNOSIS — F339 Major depressive disorder, recurrent, unspecified: Secondary | ICD-10-CM | POA: Diagnosis not present

## 2022-02-20 DIAGNOSIS — F03918 Unspecified dementia, unspecified severity, with other behavioral disturbance: Secondary | ICD-10-CM | POA: Diagnosis not present

## 2022-02-20 DIAGNOSIS — J449 Chronic obstructive pulmonary disease, unspecified: Secondary | ICD-10-CM | POA: Diagnosis not present

## 2022-02-20 DIAGNOSIS — S0993XA Unspecified injury of face, initial encounter: Secondary | ICD-10-CM | POA: Diagnosis not present

## 2022-02-20 DIAGNOSIS — S4992XA Unspecified injury of left shoulder and upper arm, initial encounter: Secondary | ICD-10-CM | POA: Diagnosis not present

## 2022-02-20 DIAGNOSIS — S0990XA Unspecified injury of head, initial encounter: Secondary | ICD-10-CM | POA: Diagnosis not present

## 2022-02-20 DIAGNOSIS — Z79899 Other long term (current) drug therapy: Secondary | ICD-10-CM | POA: Diagnosis not present

## 2022-02-20 DIAGNOSIS — W19XXXA Unspecified fall, initial encounter: Secondary | ICD-10-CM | POA: Diagnosis not present

## 2022-02-20 DIAGNOSIS — I509 Heart failure, unspecified: Secondary | ICD-10-CM | POA: Diagnosis not present

## 2022-02-20 DIAGNOSIS — Z741 Need for assistance with personal care: Secondary | ICD-10-CM | POA: Diagnosis not present

## 2022-02-20 DIAGNOSIS — E785 Hyperlipidemia, unspecified: Secondary | ICD-10-CM | POA: Diagnosis not present

## 2022-02-20 DIAGNOSIS — Z7951 Long term (current) use of inhaled steroids: Secondary | ICD-10-CM | POA: Diagnosis not present

## 2022-02-20 DIAGNOSIS — E8770 Fluid overload, unspecified: Secondary | ICD-10-CM | POA: Diagnosis not present

## 2022-02-20 DIAGNOSIS — S42002A Fracture of unspecified part of left clavicle, initial encounter for closed fracture: Secondary | ICD-10-CM | POA: Diagnosis not present

## 2022-02-20 DIAGNOSIS — F3170 Bipolar disorder, currently in remission, most recent episode unspecified: Secondary | ICD-10-CM | POA: Diagnosis not present

## 2022-02-27 DIAGNOSIS — S42002A Fracture of unspecified part of left clavicle, initial encounter for closed fracture: Secondary | ICD-10-CM | POA: Diagnosis not present

## 2022-02-27 DIAGNOSIS — F339 Major depressive disorder, recurrent, unspecified: Secondary | ICD-10-CM | POA: Diagnosis not present

## 2022-02-27 DIAGNOSIS — R7303 Prediabetes: Secondary | ICD-10-CM | POA: Diagnosis not present

## 2022-02-27 DIAGNOSIS — R627 Adult failure to thrive: Secondary | ICD-10-CM | POA: Diagnosis not present

## 2022-02-27 DIAGNOSIS — I10 Essential (primary) hypertension: Secondary | ICD-10-CM | POA: Diagnosis not present

## 2022-02-27 DIAGNOSIS — I509 Heart failure, unspecified: Secondary | ICD-10-CM | POA: Diagnosis not present

## 2022-02-27 DIAGNOSIS — F03918 Unspecified dementia, unspecified severity, with other behavioral disturbance: Secondary | ICD-10-CM | POA: Diagnosis not present

## 2022-02-27 DIAGNOSIS — J449 Chronic obstructive pulmonary disease, unspecified: Secondary | ICD-10-CM | POA: Diagnosis not present

## 2022-02-27 DIAGNOSIS — F319 Bipolar disorder, unspecified: Secondary | ICD-10-CM | POA: Diagnosis not present

## 2022-03-08 DIAGNOSIS — F03918 Unspecified dementia, unspecified severity, with other behavioral disturbance: Secondary | ICD-10-CM | POA: Diagnosis not present

## 2022-03-08 DIAGNOSIS — R627 Adult failure to thrive: Secondary | ICD-10-CM | POA: Diagnosis not present

## 2022-03-08 DIAGNOSIS — J449 Chronic obstructive pulmonary disease, unspecified: Secondary | ICD-10-CM | POA: Diagnosis not present

## 2022-03-08 DIAGNOSIS — F319 Bipolar disorder, unspecified: Secondary | ICD-10-CM | POA: Diagnosis not present

## 2022-03-08 DIAGNOSIS — I509 Heart failure, unspecified: Secondary | ICD-10-CM | POA: Diagnosis not present

## 2022-03-08 DIAGNOSIS — R7303 Prediabetes: Secondary | ICD-10-CM | POA: Diagnosis not present

## 2022-03-08 DIAGNOSIS — F339 Major depressive disorder, recurrent, unspecified: Secondary | ICD-10-CM | POA: Diagnosis not present

## 2022-03-08 DIAGNOSIS — S42002A Fracture of unspecified part of left clavicle, initial encounter for closed fracture: Secondary | ICD-10-CM | POA: Diagnosis not present

## 2022-03-08 DIAGNOSIS — I10 Essential (primary) hypertension: Secondary | ICD-10-CM | POA: Diagnosis not present

## 2022-03-11 DIAGNOSIS — Z7951 Long term (current) use of inhaled steroids: Secondary | ICD-10-CM | POA: Diagnosis not present

## 2022-03-11 DIAGNOSIS — Z7901 Long term (current) use of anticoagulants: Secondary | ICD-10-CM | POA: Diagnosis not present

## 2022-03-11 DIAGNOSIS — Z79899 Other long term (current) drug therapy: Secondary | ICD-10-CM | POA: Diagnosis not present

## 2022-03-11 DIAGNOSIS — S4992XA Unspecified injury of left shoulder and upper arm, initial encounter: Secondary | ICD-10-CM | POA: Diagnosis not present

## 2022-03-11 DIAGNOSIS — Z7982 Long term (current) use of aspirin: Secondary | ICD-10-CM | POA: Diagnosis not present

## 2022-03-11 DIAGNOSIS — S0000XA Unspecified superficial injury of scalp, initial encounter: Secondary | ICD-10-CM | POA: Diagnosis not present

## 2022-03-11 DIAGNOSIS — S0990XA Unspecified injury of head, initial encounter: Secondary | ICD-10-CM | POA: Diagnosis not present

## 2022-03-11 DIAGNOSIS — M25512 Pain in left shoulder: Secondary | ICD-10-CM | POA: Diagnosis not present

## 2022-03-11 DIAGNOSIS — W19XXXA Unspecified fall, initial encounter: Secondary | ICD-10-CM | POA: Diagnosis not present

## 2022-03-11 DIAGNOSIS — S199XXA Unspecified injury of neck, initial encounter: Secondary | ICD-10-CM | POA: Diagnosis not present

## 2022-03-15 DIAGNOSIS — R627 Adult failure to thrive: Secondary | ICD-10-CM | POA: Diagnosis not present

## 2022-03-15 DIAGNOSIS — F03918 Unspecified dementia, unspecified severity, with other behavioral disturbance: Secondary | ICD-10-CM | POA: Diagnosis not present

## 2022-03-15 DIAGNOSIS — S42002A Fracture of unspecified part of left clavicle, initial encounter for closed fracture: Secondary | ICD-10-CM | POA: Diagnosis not present

## 2022-03-15 DIAGNOSIS — F339 Major depressive disorder, recurrent, unspecified: Secondary | ICD-10-CM | POA: Diagnosis not present

## 2022-03-15 DIAGNOSIS — I10 Essential (primary) hypertension: Secondary | ICD-10-CM | POA: Diagnosis not present

## 2022-03-15 DIAGNOSIS — J449 Chronic obstructive pulmonary disease, unspecified: Secondary | ICD-10-CM | POA: Diagnosis not present

## 2022-03-15 DIAGNOSIS — I509 Heart failure, unspecified: Secondary | ICD-10-CM | POA: Diagnosis not present

## 2022-03-15 DIAGNOSIS — R7303 Prediabetes: Secondary | ICD-10-CM | POA: Diagnosis not present

## 2022-03-15 DIAGNOSIS — F319 Bipolar disorder, unspecified: Secondary | ICD-10-CM | POA: Diagnosis not present

## 2022-03-20 DIAGNOSIS — R7303 Prediabetes: Secondary | ICD-10-CM | POA: Diagnosis not present

## 2022-03-20 DIAGNOSIS — I509 Heart failure, unspecified: Secondary | ICD-10-CM | POA: Diagnosis not present

## 2022-03-20 DIAGNOSIS — R627 Adult failure to thrive: Secondary | ICD-10-CM | POA: Diagnosis not present

## 2022-03-20 DIAGNOSIS — F03918 Unspecified dementia, unspecified severity, with other behavioral disturbance: Secondary | ICD-10-CM | POA: Diagnosis not present

## 2022-03-20 DIAGNOSIS — F339 Major depressive disorder, recurrent, unspecified: Secondary | ICD-10-CM | POA: Diagnosis not present

## 2022-03-20 DIAGNOSIS — I10 Essential (primary) hypertension: Secondary | ICD-10-CM | POA: Diagnosis not present

## 2022-03-20 DIAGNOSIS — S42002A Fracture of unspecified part of left clavicle, initial encounter for closed fracture: Secondary | ICD-10-CM | POA: Diagnosis not present

## 2022-03-20 DIAGNOSIS — F319 Bipolar disorder, unspecified: Secondary | ICD-10-CM | POA: Diagnosis not present

## 2022-03-20 DIAGNOSIS — J449 Chronic obstructive pulmonary disease, unspecified: Secondary | ICD-10-CM | POA: Diagnosis not present

## 2022-03-27 ENCOUNTER — Other Ambulatory Visit: Payer: Self-pay

## 2022-03-27 DIAGNOSIS — Z79899 Other long term (current) drug therapy: Secondary | ICD-10-CM | POA: Diagnosis not present

## 2022-03-27 NOTE — Patient Outreach (Signed)
[place] [facility]) Care Management ? ?03/27/2022 ? ?Sandra Robinson ?1951-08-12 ?[id number] ? ? ?Patient discharged from [facility] 03-23-22.  Facility in [place].  Called to patient no answer.  HIPAA compliant voice message left. Telephone call to son Macky Lower.  He states patient is in [place] with him and that patient is in a medicaid bed at facility.   ? ?Plan: RN CM will close case as patient out of service area and remains in a facility long term.    ? ?Jone Baseman, RN, MSN ?[facility] Care Management ?Care Management Coordinator ?Direct Line [phone] ?Toll Free: [phone]  ?Fax: [phone] ? ?

## 2022-03-27 NOTE — Patient Outreach (Signed)
[place] [facility]) Care Management ? ?03/27/2022 ? ?Sandra Robinson ?August 21, 1951 ?[id number] ? ? ?[employer] member referral from SNF. Request assigned to Jon Billings, RN for transition of care follow up. ? ?[name] ?[facility]-Care Management Assistant ?[phone]  ?

## 2022-03-28 DIAGNOSIS — M79675 Pain in left toe(s): Secondary | ICD-10-CM | POA: Diagnosis not present

## 2022-03-28 DIAGNOSIS — M79674 Pain in right toe(s): Secondary | ICD-10-CM | POA: Diagnosis not present

## 2022-03-28 DIAGNOSIS — B351 Tinea unguium: Secondary | ICD-10-CM | POA: Diagnosis not present

## 2022-03-28 DIAGNOSIS — I7389 Other specified peripheral vascular diseases: Secondary | ICD-10-CM | POA: Diagnosis not present

## 2022-03-31 DIAGNOSIS — M25512 Pain in left shoulder: Secondary | ICD-10-CM | POA: Diagnosis not present

## 2022-03-31 DIAGNOSIS — M84412A Pathological fracture, left shoulder, initial encounter for fracture: Secondary | ICD-10-CM | POA: Diagnosis not present

## 2022-04-05 DIAGNOSIS — F03918 Unspecified dementia, unspecified severity, with other behavioral disturbance: Secondary | ICD-10-CM | POA: Diagnosis not present

## 2022-04-05 DIAGNOSIS — F339 Major depressive disorder, recurrent, unspecified: Secondary | ICD-10-CM | POA: Diagnosis not present

## 2022-04-05 DIAGNOSIS — I509 Heart failure, unspecified: Secondary | ICD-10-CM | POA: Diagnosis not present

## 2022-04-05 DIAGNOSIS — R627 Adult failure to thrive: Secondary | ICD-10-CM | POA: Diagnosis not present

## 2022-04-05 DIAGNOSIS — J449 Chronic obstructive pulmonary disease, unspecified: Secondary | ICD-10-CM | POA: Diagnosis not present

## 2022-04-05 DIAGNOSIS — R0602 Shortness of breath: Secondary | ICD-10-CM | POA: Diagnosis not present

## 2022-04-05 DIAGNOSIS — R4182 Altered mental status, unspecified: Secondary | ICD-10-CM | POA: Diagnosis not present

## 2022-04-05 DIAGNOSIS — I517 Cardiomegaly: Secondary | ICD-10-CM | POA: Diagnosis not present

## 2022-04-05 DIAGNOSIS — F319 Bipolar disorder, unspecified: Secondary | ICD-10-CM | POA: Diagnosis not present

## 2022-04-05 DIAGNOSIS — S42002A Fracture of unspecified part of left clavicle, initial encounter for closed fracture: Secondary | ICD-10-CM | POA: Diagnosis not present

## 2022-04-05 DIAGNOSIS — I10 Essential (primary) hypertension: Secondary | ICD-10-CM | POA: Diagnosis not present

## 2022-04-06 DIAGNOSIS — Z79899 Other long term (current) drug therapy: Secondary | ICD-10-CM | POA: Diagnosis not present

## 2022-04-06 DIAGNOSIS — E039 Hypothyroidism, unspecified: Secondary | ICD-10-CM | POA: Diagnosis not present

## 2022-04-06 DIAGNOSIS — K769 Liver disease, unspecified: Secondary | ICD-10-CM | POA: Diagnosis not present

## 2022-04-06 DIAGNOSIS — D649 Anemia, unspecified: Secondary | ICD-10-CM | POA: Diagnosis not present

## 2022-04-06 DIAGNOSIS — I10 Essential (primary) hypertension: Secondary | ICD-10-CM | POA: Diagnosis not present

## 2022-04-06 DIAGNOSIS — R040 Epistaxis: Secondary | ICD-10-CM | POA: Diagnosis not present

## 2022-04-07 DIAGNOSIS — Z743 Need for continuous supervision: Secondary | ICD-10-CM | POA: Diagnosis not present

## 2022-04-07 DIAGNOSIS — R040 Epistaxis: Secondary | ICD-10-CM | POA: Diagnosis not present

## 2022-04-07 DIAGNOSIS — F0390 Unspecified dementia without behavioral disturbance: Secondary | ICD-10-CM | POA: Diagnosis not present

## 2022-04-07 DIAGNOSIS — I10 Essential (primary) hypertension: Secondary | ICD-10-CM | POA: Diagnosis not present

## 2022-04-07 DIAGNOSIS — Z87891 Personal history of nicotine dependence: Secondary | ICD-10-CM | POA: Diagnosis not present

## 2022-04-10 DIAGNOSIS — F339 Major depressive disorder, recurrent, unspecified: Secondary | ICD-10-CM | POA: Diagnosis not present

## 2022-04-10 DIAGNOSIS — R7303 Prediabetes: Secondary | ICD-10-CM | POA: Diagnosis not present

## 2022-04-10 DIAGNOSIS — J449 Chronic obstructive pulmonary disease, unspecified: Secondary | ICD-10-CM | POA: Diagnosis not present

## 2022-04-10 DIAGNOSIS — Z79899 Other long term (current) drug therapy: Secondary | ICD-10-CM | POA: Diagnosis not present

## 2022-04-10 DIAGNOSIS — I509 Heart failure, unspecified: Secondary | ICD-10-CM | POA: Diagnosis not present

## 2022-04-10 DIAGNOSIS — F319 Bipolar disorder, unspecified: Secondary | ICD-10-CM | POA: Diagnosis not present

## 2022-04-10 DIAGNOSIS — S42002A Fracture of unspecified part of left clavicle, initial encounter for closed fracture: Secondary | ICD-10-CM | POA: Diagnosis not present

## 2022-04-10 DIAGNOSIS — F03918 Unspecified dementia, unspecified severity, with other behavioral disturbance: Secondary | ICD-10-CM | POA: Diagnosis not present

## 2022-04-10 DIAGNOSIS — I10 Essential (primary) hypertension: Secondary | ICD-10-CM | POA: Diagnosis not present

## 2022-04-10 DIAGNOSIS — R627 Adult failure to thrive: Secondary | ICD-10-CM | POA: Diagnosis not present

## 2022-04-11 DIAGNOSIS — R455 Hostility: Secondary | ICD-10-CM | POA: Diagnosis not present

## 2022-04-11 DIAGNOSIS — Z7901 Long term (current) use of anticoagulants: Secondary | ICD-10-CM | POA: Diagnosis not present

## 2022-04-11 DIAGNOSIS — J811 Chronic pulmonary edema: Secondary | ICD-10-CM | POA: Diagnosis not present

## 2022-04-11 DIAGNOSIS — I110 Hypertensive heart disease with heart failure: Secondary | ICD-10-CM | POA: Diagnosis not present

## 2022-04-11 DIAGNOSIS — R456 Violent behavior: Secondary | ICD-10-CM | POA: Diagnosis not present

## 2022-04-11 DIAGNOSIS — I509 Heart failure, unspecified: Secondary | ICD-10-CM | POA: Diagnosis not present

## 2022-04-11 DIAGNOSIS — J449 Chronic obstructive pulmonary disease, unspecified: Secondary | ICD-10-CM | POA: Diagnosis not present

## 2022-04-11 DIAGNOSIS — Z9981 Dependence on supplemental oxygen: Secondary | ICD-10-CM | POA: Diagnosis not present

## 2022-04-11 DIAGNOSIS — F0390 Unspecified dementia without behavioral disturbance: Secondary | ICD-10-CM | POA: Diagnosis not present

## 2022-04-11 DIAGNOSIS — Z7951 Long term (current) use of inhaled steroids: Secondary | ICD-10-CM | POA: Diagnosis not present

## 2022-04-11 DIAGNOSIS — R4182 Altered mental status, unspecified: Secondary | ICD-10-CM | POA: Diagnosis not present

## 2022-04-11 DIAGNOSIS — S0990XA Unspecified injury of head, initial encounter: Secondary | ICD-10-CM | POA: Diagnosis not present

## 2022-04-11 DIAGNOSIS — Z20822 Contact with and (suspected) exposure to covid-19: Secondary | ICD-10-CM | POA: Diagnosis not present

## 2022-04-12 DIAGNOSIS — I509 Heart failure, unspecified: Secondary | ICD-10-CM | POA: Diagnosis not present

## 2022-04-12 DIAGNOSIS — I10 Essential (primary) hypertension: Secondary | ICD-10-CM | POA: Diagnosis not present

## 2022-04-12 DIAGNOSIS — J449 Chronic obstructive pulmonary disease, unspecified: Secondary | ICD-10-CM | POA: Diagnosis not present

## 2022-04-12 DIAGNOSIS — I6350 Cerebral infarction due to unspecified occlusion or stenosis of unspecified cerebral artery: Secondary | ICD-10-CM | POA: Diagnosis not present

## 2022-04-12 DIAGNOSIS — R4182 Altered mental status, unspecified: Secondary | ICD-10-CM | POA: Diagnosis not present

## 2022-04-12 DIAGNOSIS — F339 Major depressive disorder, recurrent, unspecified: Secondary | ICD-10-CM | POA: Diagnosis not present

## 2022-04-12 DIAGNOSIS — F0390 Unspecified dementia without behavioral disturbance: Secondary | ICD-10-CM | POA: Diagnosis not present

## 2022-04-12 DIAGNOSIS — S0990XA Unspecified injury of head, initial encounter: Secondary | ICD-10-CM | POA: Diagnosis not present

## 2022-04-12 DIAGNOSIS — J811 Chronic pulmonary edema: Secondary | ICD-10-CM | POA: Diagnosis not present

## 2022-04-12 DIAGNOSIS — F319 Bipolar disorder, unspecified: Secondary | ICD-10-CM | POA: Diagnosis not present

## 2022-04-12 DIAGNOSIS — F03918 Unspecified dementia, unspecified severity, with other behavioral disturbance: Secondary | ICD-10-CM | POA: Diagnosis not present

## 2022-04-12 DIAGNOSIS — R627 Adult failure to thrive: Secondary | ICD-10-CM | POA: Diagnosis not present

## 2022-04-12 DIAGNOSIS — S42002A Fracture of unspecified part of left clavicle, initial encounter for closed fracture: Secondary | ICD-10-CM | POA: Diagnosis not present

## 2022-04-13 DIAGNOSIS — F411 Generalized anxiety disorder: Secondary | ICD-10-CM | POA: Diagnosis not present

## 2022-04-13 DIAGNOSIS — F339 Major depressive disorder, recurrent, unspecified: Secondary | ICD-10-CM | POA: Diagnosis not present

## 2022-04-17 DIAGNOSIS — Z79899 Other long term (current) drug therapy: Secondary | ICD-10-CM | POA: Diagnosis not present

## 2022-04-18 DIAGNOSIS — R4182 Altered mental status, unspecified: Secondary | ICD-10-CM | POA: Diagnosis not present

## 2022-04-18 DIAGNOSIS — F03918 Unspecified dementia, unspecified severity, with other behavioral disturbance: Secondary | ICD-10-CM | POA: Diagnosis not present

## 2022-04-18 DIAGNOSIS — S42002A Fracture of unspecified part of left clavicle, initial encounter for closed fracture: Secondary | ICD-10-CM | POA: Diagnosis not present

## 2022-04-18 DIAGNOSIS — J449 Chronic obstructive pulmonary disease, unspecified: Secondary | ICD-10-CM | POA: Diagnosis not present

## 2022-04-18 DIAGNOSIS — I509 Heart failure, unspecified: Secondary | ICD-10-CM | POA: Diagnosis not present

## 2022-04-18 DIAGNOSIS — F319 Bipolar disorder, unspecified: Secondary | ICD-10-CM | POA: Diagnosis not present

## 2022-04-18 DIAGNOSIS — R627 Adult failure to thrive: Secondary | ICD-10-CM | POA: Diagnosis not present

## 2022-04-18 DIAGNOSIS — F339 Major depressive disorder, recurrent, unspecified: Secondary | ICD-10-CM | POA: Diagnosis not present

## 2022-04-18 DIAGNOSIS — I10 Essential (primary) hypertension: Secondary | ICD-10-CM | POA: Diagnosis not present

## 2022-05-01 DIAGNOSIS — Z79899 Other long term (current) drug therapy: Secondary | ICD-10-CM | POA: Diagnosis not present

## 2022-05-09 DIAGNOSIS — I69398 Other sequelae of cerebral infarction: Secondary | ICD-10-CM | POA: Diagnosis not present

## 2022-05-09 DIAGNOSIS — Z86011 Personal history of benign neoplasm of the brain: Secondary | ICD-10-CM | POA: Diagnosis not present

## 2022-05-09 DIAGNOSIS — F03918 Unspecified dementia, unspecified severity, with other behavioral disturbance: Secondary | ICD-10-CM | POA: Diagnosis not present

## 2022-05-09 DIAGNOSIS — R1312 Dysphagia, oropharyngeal phase: Secondary | ICD-10-CM | POA: Diagnosis not present

## 2022-05-10 DIAGNOSIS — F339 Major depressive disorder, recurrent, unspecified: Secondary | ICD-10-CM | POA: Diagnosis not present

## 2022-05-10 DIAGNOSIS — I69398 Other sequelae of cerebral infarction: Secondary | ICD-10-CM | POA: Diagnosis not present

## 2022-05-10 DIAGNOSIS — I509 Heart failure, unspecified: Secondary | ICD-10-CM | POA: Diagnosis not present

## 2022-05-10 DIAGNOSIS — R4182 Altered mental status, unspecified: Secondary | ICD-10-CM | POA: Diagnosis not present

## 2022-05-10 DIAGNOSIS — R627 Adult failure to thrive: Secondary | ICD-10-CM | POA: Diagnosis not present

## 2022-05-10 DIAGNOSIS — F319 Bipolar disorder, unspecified: Secondary | ICD-10-CM | POA: Diagnosis not present

## 2022-05-10 DIAGNOSIS — Z86011 Personal history of benign neoplasm of the brain: Secondary | ICD-10-CM | POA: Diagnosis not present

## 2022-05-10 DIAGNOSIS — I10 Essential (primary) hypertension: Secondary | ICD-10-CM | POA: Diagnosis not present

## 2022-05-10 DIAGNOSIS — F03918 Unspecified dementia, unspecified severity, with other behavioral disturbance: Secondary | ICD-10-CM | POA: Diagnosis not present

## 2022-05-10 DIAGNOSIS — S42002A Fracture of unspecified part of left clavicle, initial encounter for closed fracture: Secondary | ICD-10-CM | POA: Diagnosis not present

## 2022-05-10 DIAGNOSIS — R1312 Dysphagia, oropharyngeal phase: Secondary | ICD-10-CM | POA: Diagnosis not present

## 2022-05-10 DIAGNOSIS — J449 Chronic obstructive pulmonary disease, unspecified: Secondary | ICD-10-CM | POA: Diagnosis not present

## 2022-05-11 DIAGNOSIS — R1312 Dysphagia, oropharyngeal phase: Secondary | ICD-10-CM | POA: Diagnosis not present

## 2022-05-11 DIAGNOSIS — I69398 Other sequelae of cerebral infarction: Secondary | ICD-10-CM | POA: Diagnosis not present

## 2022-05-11 DIAGNOSIS — Z86011 Personal history of benign neoplasm of the brain: Secondary | ICD-10-CM | POA: Diagnosis not present

## 2022-05-11 DIAGNOSIS — F03918 Unspecified dementia, unspecified severity, with other behavioral disturbance: Secondary | ICD-10-CM | POA: Diagnosis not present

## 2022-05-12 DIAGNOSIS — R1312 Dysphagia, oropharyngeal phase: Secondary | ICD-10-CM | POA: Diagnosis not present

## 2022-05-12 DIAGNOSIS — I69398 Other sequelae of cerebral infarction: Secondary | ICD-10-CM | POA: Diagnosis not present

## 2022-05-12 DIAGNOSIS — Z86011 Personal history of benign neoplasm of the brain: Secondary | ICD-10-CM | POA: Diagnosis not present

## 2022-05-12 DIAGNOSIS — F03918 Unspecified dementia, unspecified severity, with other behavioral disturbance: Secondary | ICD-10-CM | POA: Diagnosis not present

## 2022-05-15 DIAGNOSIS — Z86011 Personal history of benign neoplasm of the brain: Secondary | ICD-10-CM | POA: Diagnosis not present

## 2022-05-15 DIAGNOSIS — R1312 Dysphagia, oropharyngeal phase: Secondary | ICD-10-CM | POA: Diagnosis not present

## 2022-05-15 DIAGNOSIS — F03918 Unspecified dementia, unspecified severity, with other behavioral disturbance: Secondary | ICD-10-CM | POA: Diagnosis not present

## 2022-05-15 DIAGNOSIS — I69398 Other sequelae of cerebral infarction: Secondary | ICD-10-CM | POA: Diagnosis not present

## 2022-05-16 DIAGNOSIS — Z86011 Personal history of benign neoplasm of the brain: Secondary | ICD-10-CM | POA: Diagnosis not present

## 2022-05-16 DIAGNOSIS — I69398 Other sequelae of cerebral infarction: Secondary | ICD-10-CM | POA: Diagnosis not present

## 2022-05-16 DIAGNOSIS — F03918 Unspecified dementia, unspecified severity, with other behavioral disturbance: Secondary | ICD-10-CM | POA: Diagnosis not present

## 2022-05-16 DIAGNOSIS — R1312 Dysphagia, oropharyngeal phase: Secondary | ICD-10-CM | POA: Diagnosis not present

## 2022-05-16 DIAGNOSIS — Z79899 Other long term (current) drug therapy: Secondary | ICD-10-CM | POA: Diagnosis not present

## 2022-05-17 DIAGNOSIS — Z86011 Personal history of benign neoplasm of the brain: Secondary | ICD-10-CM | POA: Diagnosis not present

## 2022-05-17 DIAGNOSIS — I69398 Other sequelae of cerebral infarction: Secondary | ICD-10-CM | POA: Diagnosis not present

## 2022-05-17 DIAGNOSIS — R1312 Dysphagia, oropharyngeal phase: Secondary | ICD-10-CM | POA: Diagnosis not present

## 2022-05-17 DIAGNOSIS — F03918 Unspecified dementia, unspecified severity, with other behavioral disturbance: Secondary | ICD-10-CM | POA: Diagnosis not present

## 2022-05-18 DIAGNOSIS — R1312 Dysphagia, oropharyngeal phase: Secondary | ICD-10-CM | POA: Diagnosis not present

## 2022-05-18 DIAGNOSIS — F03918 Unspecified dementia, unspecified severity, with other behavioral disturbance: Secondary | ICD-10-CM | POA: Diagnosis not present

## 2022-05-18 DIAGNOSIS — Z86011 Personal history of benign neoplasm of the brain: Secondary | ICD-10-CM | POA: Diagnosis not present

## 2022-05-18 DIAGNOSIS — I69398 Other sequelae of cerebral infarction: Secondary | ICD-10-CM | POA: Diagnosis not present

## 2022-05-19 DIAGNOSIS — I69398 Other sequelae of cerebral infarction: Secondary | ICD-10-CM | POA: Diagnosis not present

## 2022-05-19 DIAGNOSIS — R1312 Dysphagia, oropharyngeal phase: Secondary | ICD-10-CM | POA: Diagnosis not present

## 2022-05-19 DIAGNOSIS — F03918 Unspecified dementia, unspecified severity, with other behavioral disturbance: Secondary | ICD-10-CM | POA: Diagnosis not present

## 2022-05-19 DIAGNOSIS — Z86011 Personal history of benign neoplasm of the brain: Secondary | ICD-10-CM | POA: Diagnosis not present

## 2022-05-22 DIAGNOSIS — R1312 Dysphagia, oropharyngeal phase: Secondary | ICD-10-CM | POA: Diagnosis not present

## 2022-05-22 DIAGNOSIS — F03918 Unspecified dementia, unspecified severity, with other behavioral disturbance: Secondary | ICD-10-CM | POA: Diagnosis not present

## 2022-05-22 DIAGNOSIS — I69398 Other sequelae of cerebral infarction: Secondary | ICD-10-CM | POA: Diagnosis not present

## 2022-05-22 DIAGNOSIS — Z86011 Personal history of benign neoplasm of the brain: Secondary | ICD-10-CM | POA: Diagnosis not present

## 2022-05-23 DIAGNOSIS — F03918 Unspecified dementia, unspecified severity, with other behavioral disturbance: Secondary | ICD-10-CM | POA: Diagnosis not present

## 2022-05-23 DIAGNOSIS — R1312 Dysphagia, oropharyngeal phase: Secondary | ICD-10-CM | POA: Diagnosis not present

## 2022-05-23 DIAGNOSIS — Z86011 Personal history of benign neoplasm of the brain: Secondary | ICD-10-CM | POA: Diagnosis not present

## 2022-05-23 DIAGNOSIS — I69398 Other sequelae of cerebral infarction: Secondary | ICD-10-CM | POA: Diagnosis not present

## 2022-05-24 DIAGNOSIS — F03918 Unspecified dementia, unspecified severity, with other behavioral disturbance: Secondary | ICD-10-CM | POA: Diagnosis not present

## 2022-05-24 DIAGNOSIS — G4700 Insomnia, unspecified: Secondary | ICD-10-CM | POA: Diagnosis not present

## 2022-05-24 DIAGNOSIS — I69398 Other sequelae of cerebral infarction: Secondary | ICD-10-CM | POA: Diagnosis not present

## 2022-05-24 DIAGNOSIS — Z86011 Personal history of benign neoplasm of the brain: Secondary | ICD-10-CM | POA: Diagnosis not present

## 2022-05-24 DIAGNOSIS — R1312 Dysphagia, oropharyngeal phase: Secondary | ICD-10-CM | POA: Diagnosis not present

## 2022-05-24 DIAGNOSIS — F319 Bipolar disorder, unspecified: Secondary | ICD-10-CM | POA: Diagnosis not present

## 2022-05-24 DIAGNOSIS — F411 Generalized anxiety disorder: Secondary | ICD-10-CM | POA: Diagnosis not present

## 2022-05-25 DIAGNOSIS — I69398 Other sequelae of cerebral infarction: Secondary | ICD-10-CM | POA: Diagnosis not present

## 2022-05-25 DIAGNOSIS — F03918 Unspecified dementia, unspecified severity, with other behavioral disturbance: Secondary | ICD-10-CM | POA: Diagnosis not present

## 2022-05-25 DIAGNOSIS — Z86011 Personal history of benign neoplasm of the brain: Secondary | ICD-10-CM | POA: Diagnosis not present

## 2022-05-25 DIAGNOSIS — R1312 Dysphagia, oropharyngeal phase: Secondary | ICD-10-CM | POA: Diagnosis not present

## 2022-05-26 DIAGNOSIS — Z86011 Personal history of benign neoplasm of the brain: Secondary | ICD-10-CM | POA: Diagnosis not present

## 2022-05-26 DIAGNOSIS — F03918 Unspecified dementia, unspecified severity, with other behavioral disturbance: Secondary | ICD-10-CM | POA: Diagnosis not present

## 2022-05-26 DIAGNOSIS — I69398 Other sequelae of cerebral infarction: Secondary | ICD-10-CM | POA: Diagnosis not present

## 2022-05-26 DIAGNOSIS — R1312 Dysphagia, oropharyngeal phase: Secondary | ICD-10-CM | POA: Diagnosis not present

## 2022-05-29 DIAGNOSIS — R1312 Dysphagia, oropharyngeal phase: Secondary | ICD-10-CM | POA: Diagnosis not present

## 2022-05-29 DIAGNOSIS — I69398 Other sequelae of cerebral infarction: Secondary | ICD-10-CM | POA: Diagnosis not present

## 2022-05-29 DIAGNOSIS — F03918 Unspecified dementia, unspecified severity, with other behavioral disturbance: Secondary | ICD-10-CM | POA: Diagnosis not present

## 2022-05-29 DIAGNOSIS — Z86011 Personal history of benign neoplasm of the brain: Secondary | ICD-10-CM | POA: Diagnosis not present

## 2022-05-30 DIAGNOSIS — I69398 Other sequelae of cerebral infarction: Secondary | ICD-10-CM | POA: Diagnosis not present

## 2022-05-30 DIAGNOSIS — F03918 Unspecified dementia, unspecified severity, with other behavioral disturbance: Secondary | ICD-10-CM | POA: Diagnosis not present

## 2022-05-30 DIAGNOSIS — R1312 Dysphagia, oropharyngeal phase: Secondary | ICD-10-CM | POA: Diagnosis not present

## 2022-05-30 DIAGNOSIS — Z86011 Personal history of benign neoplasm of the brain: Secondary | ICD-10-CM | POA: Diagnosis not present

## 2022-05-31 DIAGNOSIS — F03918 Unspecified dementia, unspecified severity, with other behavioral disturbance: Secondary | ICD-10-CM | POA: Diagnosis not present

## 2022-05-31 DIAGNOSIS — I69398 Other sequelae of cerebral infarction: Secondary | ICD-10-CM | POA: Diagnosis not present

## 2022-05-31 DIAGNOSIS — Z86011 Personal history of benign neoplasm of the brain: Secondary | ICD-10-CM | POA: Diagnosis not present

## 2022-05-31 DIAGNOSIS — R1312 Dysphagia, oropharyngeal phase: Secondary | ICD-10-CM | POA: Diagnosis not present

## 2022-06-01 DIAGNOSIS — I69398 Other sequelae of cerebral infarction: Secondary | ICD-10-CM | POA: Diagnosis not present

## 2022-06-01 DIAGNOSIS — F03918 Unspecified dementia, unspecified severity, with other behavioral disturbance: Secondary | ICD-10-CM | POA: Diagnosis not present

## 2022-06-01 DIAGNOSIS — Z86011 Personal history of benign neoplasm of the brain: Secondary | ICD-10-CM | POA: Diagnosis not present

## 2022-06-01 DIAGNOSIS — R1312 Dysphagia, oropharyngeal phase: Secondary | ICD-10-CM | POA: Diagnosis not present

## 2022-06-02 DIAGNOSIS — Z86011 Personal history of benign neoplasm of the brain: Secondary | ICD-10-CM | POA: Diagnosis not present

## 2022-06-02 DIAGNOSIS — M6281 Muscle weakness (generalized): Secondary | ICD-10-CM | POA: Diagnosis not present

## 2022-06-02 DIAGNOSIS — R7303 Prediabetes: Secondary | ICD-10-CM | POA: Diagnosis not present

## 2022-06-02 DIAGNOSIS — I509 Heart failure, unspecified: Secondary | ICD-10-CM | POA: Diagnosis not present

## 2022-06-02 DIAGNOSIS — J449 Chronic obstructive pulmonary disease, unspecified: Secondary | ICD-10-CM | POA: Diagnosis not present

## 2022-06-02 DIAGNOSIS — E782 Mixed hyperlipidemia: Secondary | ICD-10-CM | POA: Diagnosis not present

## 2022-06-02 DIAGNOSIS — I69398 Other sequelae of cerebral infarction: Secondary | ICD-10-CM | POA: Diagnosis not present

## 2022-06-02 DIAGNOSIS — I10 Essential (primary) hypertension: Secondary | ICD-10-CM | POA: Diagnosis not present

## 2022-06-02 DIAGNOSIS — R627 Adult failure to thrive: Secondary | ICD-10-CM | POA: Diagnosis not present

## 2022-06-02 DIAGNOSIS — R1312 Dysphagia, oropharyngeal phase: Secondary | ICD-10-CM | POA: Diagnosis not present

## 2022-06-02 DIAGNOSIS — F339 Major depressive disorder, recurrent, unspecified: Secondary | ICD-10-CM | POA: Diagnosis not present

## 2022-06-02 DIAGNOSIS — F03918 Unspecified dementia, unspecified severity, with other behavioral disturbance: Secondary | ICD-10-CM | POA: Diagnosis not present

## 2022-06-05 DIAGNOSIS — Z86011 Personal history of benign neoplasm of the brain: Secondary | ICD-10-CM | POA: Diagnosis not present

## 2022-06-05 DIAGNOSIS — I69398 Other sequelae of cerebral infarction: Secondary | ICD-10-CM | POA: Diagnosis not present

## 2022-06-05 DIAGNOSIS — F03918 Unspecified dementia, unspecified severity, with other behavioral disturbance: Secondary | ICD-10-CM | POA: Diagnosis not present

## 2022-06-05 DIAGNOSIS — R1312 Dysphagia, oropharyngeal phase: Secondary | ICD-10-CM | POA: Diagnosis not present

## 2022-06-14 DIAGNOSIS — R627 Adult failure to thrive: Secondary | ICD-10-CM | POA: Diagnosis not present

## 2022-06-14 DIAGNOSIS — I509 Heart failure, unspecified: Secondary | ICD-10-CM | POA: Diagnosis not present

## 2022-06-14 DIAGNOSIS — F03918 Unspecified dementia, unspecified severity, with other behavioral disturbance: Secondary | ICD-10-CM | POA: Diagnosis not present

## 2022-06-14 DIAGNOSIS — F339 Major depressive disorder, recurrent, unspecified: Secondary | ICD-10-CM | POA: Diagnosis not present

## 2022-06-14 DIAGNOSIS — M6281 Muscle weakness (generalized): Secondary | ICD-10-CM | POA: Diagnosis not present

## 2022-06-14 DIAGNOSIS — E782 Mixed hyperlipidemia: Secondary | ICD-10-CM | POA: Diagnosis not present

## 2022-06-14 DIAGNOSIS — I10 Essential (primary) hypertension: Secondary | ICD-10-CM | POA: Diagnosis not present

## 2022-06-14 DIAGNOSIS — R7303 Prediabetes: Secondary | ICD-10-CM | POA: Diagnosis not present

## 2022-06-14 DIAGNOSIS — J449 Chronic obstructive pulmonary disease, unspecified: Secondary | ICD-10-CM | POA: Diagnosis not present

## 2022-06-15 DIAGNOSIS — G309 Alzheimer's disease, unspecified: Secondary | ICD-10-CM | POA: Diagnosis not present

## 2022-06-15 DIAGNOSIS — F0390 Unspecified dementia without behavioral disturbance: Secondary | ICD-10-CM | POA: Diagnosis not present

## 2022-06-15 DIAGNOSIS — F339 Major depressive disorder, recurrent, unspecified: Secondary | ICD-10-CM | POA: Diagnosis not present

## 2022-06-15 DIAGNOSIS — F411 Generalized anxiety disorder: Secondary | ICD-10-CM | POA: Diagnosis not present
